# Patient Record
Sex: Male | Born: 1975 | Race: White | Hispanic: No | Marital: Married | Smoking: Current every day smoker
Health system: Southern US, Community
[De-identification: ages and names within clinical notes are randomized; demographics above are authoritative.]

## PROBLEM LIST (undated history)

## (undated) DIAGNOSIS — M1990 Unspecified osteoarthritis, unspecified site: Secondary | ICD-10-CM

## (undated) DIAGNOSIS — I10 Essential (primary) hypertension: Secondary | ICD-10-CM

## (undated) HISTORY — DX: Unspecified osteoarthritis, unspecified site: M19.90

---

## 2009-07-21 ENCOUNTER — Emergency Department: Payer: Self-pay | Admitting: Emergency Medicine

## 2009-08-25 ENCOUNTER — Ambulatory Visit: Payer: Self-pay | Admitting: Unknown Physician Specialty

## 2012-05-02 ENCOUNTER — Ambulatory Visit: Payer: Self-pay | Admitting: Family

## 2015-04-08 DIAGNOSIS — G5701 Lesion of sciatic nerve, right lower limb: Secondary | ICD-10-CM | POA: Diagnosis not present

## 2015-04-08 DIAGNOSIS — R5381 Other malaise: Secondary | ICD-10-CM | POA: Diagnosis not present

## 2015-06-22 DIAGNOSIS — E784 Other hyperlipidemia: Secondary | ICD-10-CM | POA: Diagnosis not present

## 2015-06-22 DIAGNOSIS — G5701 Lesion of sciatic nerve, right lower limb: Secondary | ICD-10-CM | POA: Diagnosis not present

## 2015-06-22 DIAGNOSIS — I10 Essential (primary) hypertension: Secondary | ICD-10-CM | POA: Diagnosis not present

## 2015-06-22 DIAGNOSIS — R5381 Other malaise: Secondary | ICD-10-CM | POA: Diagnosis not present

## 2015-06-22 DIAGNOSIS — G894 Chronic pain syndrome: Secondary | ICD-10-CM | POA: Diagnosis not present

## 2015-06-29 DIAGNOSIS — G5701 Lesion of sciatic nerve, right lower limb: Secondary | ICD-10-CM | POA: Diagnosis not present

## 2015-06-29 DIAGNOSIS — M5186 Other intervertebral disc disorders, lumbar region: Secondary | ICD-10-CM | POA: Diagnosis not present

## 2015-06-29 DIAGNOSIS — R748 Abnormal levels of other serum enzymes: Secondary | ICD-10-CM | POA: Diagnosis not present

## 2015-08-07 DIAGNOSIS — G5701 Lesion of sciatic nerve, right lower limb: Secondary | ICD-10-CM | POA: Diagnosis not present

## 2015-08-07 DIAGNOSIS — R748 Abnormal levels of other serum enzymes: Secondary | ICD-10-CM | POA: Diagnosis not present

## 2015-10-13 DIAGNOSIS — H722X1 Other marginal perforations of tympanic membrane, right ear: Secondary | ICD-10-CM | POA: Diagnosis not present

## 2015-10-13 DIAGNOSIS — H9011 Conductive hearing loss, unilateral, right ear, with unrestricted hearing on the contralateral side: Secondary | ICD-10-CM | POA: Diagnosis not present

## 2015-10-13 DIAGNOSIS — Z8661 Personal history of infections of the central nervous system: Secondary | ICD-10-CM | POA: Diagnosis not present

## 2015-10-13 DIAGNOSIS — H7191 Unspecified cholesteatoma, right ear: Secondary | ICD-10-CM | POA: Diagnosis not present

## 2015-11-06 DIAGNOSIS — R748 Abnormal levels of other serum enzymes: Secondary | ICD-10-CM | POA: Diagnosis not present

## 2015-11-06 DIAGNOSIS — R5381 Other malaise: Secondary | ICD-10-CM | POA: Diagnosis not present

## 2015-11-06 DIAGNOSIS — G5701 Lesion of sciatic nerve, right lower limb: Secondary | ICD-10-CM | POA: Diagnosis not present

## 2015-12-09 DIAGNOSIS — K589 Irritable bowel syndrome without diarrhea: Secondary | ICD-10-CM | POA: Diagnosis not present

## 2015-12-09 DIAGNOSIS — M5186 Other intervertebral disc disorders, lumbar region: Secondary | ICD-10-CM | POA: Diagnosis not present

## 2015-12-09 DIAGNOSIS — R748 Abnormal levels of other serum enzymes: Secondary | ICD-10-CM | POA: Diagnosis not present

## 2015-12-09 DIAGNOSIS — G5701 Lesion of sciatic nerve, right lower limb: Secondary | ICD-10-CM | POA: Diagnosis not present

## 2016-04-06 DIAGNOSIS — G5701 Lesion of sciatic nerve, right lower limb: Secondary | ICD-10-CM | POA: Diagnosis not present

## 2016-04-06 DIAGNOSIS — J309 Allergic rhinitis, unspecified: Secondary | ICD-10-CM | POA: Diagnosis not present

## 2016-04-06 DIAGNOSIS — M549 Dorsalgia, unspecified: Secondary | ICD-10-CM | POA: Diagnosis not present

## 2016-04-11 ENCOUNTER — Other Ambulatory Visit: Payer: Self-pay | Admitting: Cardiology

## 2016-04-11 ENCOUNTER — Ambulatory Visit
Admission: RE | Admit: 2016-04-11 | Discharge: 2016-04-11 | Disposition: A | Discharge status: Home / Self Care | Payer: BLUE CROSS/BLUE SHIELD | Source: Ambulatory Visit | Attending: Cardiology | Admitting: Cardiology

## 2016-04-11 ENCOUNTER — Ambulatory Visit
Admission: RE | Admit: 2016-04-11 | Discharge: 2016-04-11 | Disposition: A | Discharge status: Home / Self Care | Payer: BLUE CROSS/BLUE SHIELD | Source: Ambulatory Visit | Attending: Internal Medicine | Admitting: Internal Medicine

## 2016-04-11 DIAGNOSIS — G5701 Lesion of sciatic nerve, right lower limb: Secondary | ICD-10-CM | POA: Diagnosis not present

## 2016-04-11 DIAGNOSIS — M545 Low back pain: Secondary | ICD-10-CM | POA: Insufficient documentation

## 2016-04-11 DIAGNOSIS — M519 Unspecified thoracic, thoracolumbar and lumbosacral intervertebral disc disorder: Secondary | ICD-10-CM

## 2016-04-11 DIAGNOSIS — M5186 Other intervertebral disc disorders, lumbar region: Secondary | ICD-10-CM | POA: Diagnosis not present

## 2016-04-11 DIAGNOSIS — R5381 Other malaise: Secondary | ICD-10-CM | POA: Diagnosis not present

## 2016-04-11 DIAGNOSIS — J309 Allergic rhinitis, unspecified: Secondary | ICD-10-CM | POA: Diagnosis not present

## 2016-06-24 DIAGNOSIS — R748 Abnormal levels of other serum enzymes: Secondary | ICD-10-CM | POA: Diagnosis not present

## 2016-06-24 DIAGNOSIS — G5701 Lesion of sciatic nerve, right lower limb: Secondary | ICD-10-CM | POA: Diagnosis not present

## 2016-06-24 DIAGNOSIS — J019 Acute sinusitis, unspecified: Secondary | ICD-10-CM | POA: Diagnosis not present

## 2016-06-24 DIAGNOSIS — M549 Dorsalgia, unspecified: Secondary | ICD-10-CM | POA: Diagnosis not present

## 2016-09-26 DIAGNOSIS — M549 Dorsalgia, unspecified: Secondary | ICD-10-CM | POA: Diagnosis not present

## 2016-09-26 DIAGNOSIS — G5701 Lesion of sciatic nerve, right lower limb: Secondary | ICD-10-CM | POA: Diagnosis not present

## 2016-09-26 DIAGNOSIS — M5186 Other intervertebral disc disorders, lumbar region: Secondary | ICD-10-CM | POA: Diagnosis not present

## 2016-09-26 DIAGNOSIS — R748 Abnormal levels of other serum enzymes: Secondary | ICD-10-CM | POA: Diagnosis not present

## 2016-12-05 DIAGNOSIS — G5701 Lesion of sciatic nerve, right lower limb: Secondary | ICD-10-CM | POA: Diagnosis not present

## 2016-12-05 DIAGNOSIS — M549 Dorsalgia, unspecified: Secondary | ICD-10-CM | POA: Diagnosis not present

## 2016-12-05 DIAGNOSIS — M5186 Other intervertebral disc disorders, lumbar region: Secondary | ICD-10-CM | POA: Diagnosis not present

## 2016-12-09 ENCOUNTER — Other Ambulatory Visit: Payer: Self-pay | Admitting: Cardiology

## 2016-12-09 ENCOUNTER — Ambulatory Visit
Admission: RE | Admit: 2016-12-09 | Discharge: 2016-12-09 | Disposition: A | Discharge status: Home / Self Care | Payer: BLUE CROSS/BLUE SHIELD | Source: Ambulatory Visit | Attending: Internal Medicine | Admitting: Internal Medicine

## 2016-12-09 ENCOUNTER — Ambulatory Visit
Admission: RE | Admit: 2016-12-09 | Discharge: 2016-12-09 | Disposition: A | Discharge status: Home / Self Care | Payer: BLUE CROSS/BLUE SHIELD | Source: Ambulatory Visit | Attending: Cardiology | Admitting: Cardiology

## 2016-12-09 DIAGNOSIS — M549 Dorsalgia, unspecified: Secondary | ICD-10-CM | POA: Diagnosis not present

## 2016-12-09 DIAGNOSIS — R5381 Other malaise: Secondary | ICD-10-CM | POA: Diagnosis not present

## 2016-12-09 DIAGNOSIS — M545 Low back pain: Secondary | ICD-10-CM | POA: Insufficient documentation

## 2016-12-09 DIAGNOSIS — G5701 Lesion of sciatic nerve, right lower limb: Secondary | ICD-10-CM | POA: Diagnosis not present

## 2016-12-09 DIAGNOSIS — R748 Abnormal levels of other serum enzymes: Secondary | ICD-10-CM | POA: Diagnosis not present

## 2016-12-13 DIAGNOSIS — M549 Dorsalgia, unspecified: Secondary | ICD-10-CM | POA: Diagnosis not present

## 2016-12-13 DIAGNOSIS — J019 Acute sinusitis, unspecified: Secondary | ICD-10-CM | POA: Diagnosis not present

## 2016-12-13 DIAGNOSIS — R748 Abnormal levels of other serum enzymes: Secondary | ICD-10-CM | POA: Diagnosis not present

## 2016-12-13 DIAGNOSIS — G5701 Lesion of sciatic nerve, right lower limb: Secondary | ICD-10-CM | POA: Diagnosis not present

## 2016-12-19 DIAGNOSIS — M549 Dorsalgia, unspecified: Secondary | ICD-10-CM | POA: Diagnosis not present

## 2016-12-19 DIAGNOSIS — R5381 Other malaise: Secondary | ICD-10-CM | POA: Diagnosis not present

## 2016-12-19 DIAGNOSIS — G5701 Lesion of sciatic nerve, right lower limb: Secondary | ICD-10-CM | POA: Diagnosis not present

## 2016-12-19 DIAGNOSIS — R748 Abnormal levels of other serum enzymes: Secondary | ICD-10-CM | POA: Diagnosis not present

## 2016-12-22 DIAGNOSIS — R2689 Other abnormalities of gait and mobility: Secondary | ICD-10-CM | POA: Diagnosis not present

## 2016-12-22 DIAGNOSIS — M25551 Pain in right hip: Secondary | ICD-10-CM | POA: Diagnosis not present

## 2016-12-22 DIAGNOSIS — M545 Low back pain: Secondary | ICD-10-CM | POA: Diagnosis not present

## 2016-12-22 DIAGNOSIS — M6281 Muscle weakness (generalized): Secondary | ICD-10-CM | POA: Diagnosis not present

## 2016-12-29 DIAGNOSIS — M25551 Pain in right hip: Secondary | ICD-10-CM | POA: Diagnosis not present

## 2016-12-29 DIAGNOSIS — M545 Low back pain: Secondary | ICD-10-CM | POA: Diagnosis not present

## 2016-12-29 DIAGNOSIS — M6281 Muscle weakness (generalized): Secondary | ICD-10-CM | POA: Diagnosis not present

## 2017-01-09 DIAGNOSIS — J019 Acute sinusitis, unspecified: Secondary | ICD-10-CM | POA: Diagnosis not present

## 2017-01-09 DIAGNOSIS — R2689 Other abnormalities of gait and mobility: Secondary | ICD-10-CM | POA: Diagnosis not present

## 2017-01-09 DIAGNOSIS — M545 Low back pain: Secondary | ICD-10-CM | POA: Diagnosis not present

## 2017-01-09 DIAGNOSIS — G5701 Lesion of sciatic nerve, right lower limb: Secondary | ICD-10-CM | POA: Diagnosis not present

## 2017-01-09 DIAGNOSIS — M6281 Muscle weakness (generalized): Secondary | ICD-10-CM | POA: Diagnosis not present

## 2017-01-09 DIAGNOSIS — M549 Dorsalgia, unspecified: Secondary | ICD-10-CM | POA: Diagnosis not present

## 2017-01-09 DIAGNOSIS — M5186 Other intervertebral disc disorders, lumbar region: Secondary | ICD-10-CM | POA: Diagnosis not present

## 2017-01-09 DIAGNOSIS — M25551 Pain in right hip: Secondary | ICD-10-CM | POA: Diagnosis not present

## 2017-01-11 DIAGNOSIS — M545 Low back pain: Secondary | ICD-10-CM | POA: Diagnosis not present

## 2017-01-11 DIAGNOSIS — R2689 Other abnormalities of gait and mobility: Secondary | ICD-10-CM | POA: Diagnosis not present

## 2017-01-11 DIAGNOSIS — M6281 Muscle weakness (generalized): Secondary | ICD-10-CM | POA: Diagnosis not present

## 2017-01-11 DIAGNOSIS — M25551 Pain in right hip: Secondary | ICD-10-CM | POA: Diagnosis not present

## 2017-01-16 DIAGNOSIS — M6281 Muscle weakness (generalized): Secondary | ICD-10-CM | POA: Diagnosis not present

## 2017-01-16 DIAGNOSIS — M25551 Pain in right hip: Secondary | ICD-10-CM | POA: Diagnosis not present

## 2017-01-16 DIAGNOSIS — M545 Low back pain: Secondary | ICD-10-CM | POA: Diagnosis not present

## 2017-01-16 DIAGNOSIS — R2689 Other abnormalities of gait and mobility: Secondary | ICD-10-CM | POA: Diagnosis not present

## 2017-01-20 DIAGNOSIS — M6281 Muscle weakness (generalized): Secondary | ICD-10-CM | POA: Diagnosis not present

## 2017-01-20 DIAGNOSIS — M545 Low back pain: Secondary | ICD-10-CM | POA: Diagnosis not present

## 2017-01-20 DIAGNOSIS — M25551 Pain in right hip: Secondary | ICD-10-CM | POA: Diagnosis not present

## 2017-01-20 DIAGNOSIS — R2689 Other abnormalities of gait and mobility: Secondary | ICD-10-CM | POA: Diagnosis not present

## 2017-01-23 DIAGNOSIS — R2689 Other abnormalities of gait and mobility: Secondary | ICD-10-CM | POA: Diagnosis not present

## 2017-01-23 DIAGNOSIS — M545 Low back pain: Secondary | ICD-10-CM | POA: Diagnosis not present

## 2017-01-23 DIAGNOSIS — M6281 Muscle weakness (generalized): Secondary | ICD-10-CM | POA: Diagnosis not present

## 2017-01-23 DIAGNOSIS — M25551 Pain in right hip: Secondary | ICD-10-CM | POA: Diagnosis not present

## 2017-01-25 DIAGNOSIS — M25551 Pain in right hip: Secondary | ICD-10-CM | POA: Diagnosis not present

## 2017-01-25 DIAGNOSIS — M545 Low back pain: Secondary | ICD-10-CM | POA: Diagnosis not present

## 2017-01-25 DIAGNOSIS — M6281 Muscle weakness (generalized): Secondary | ICD-10-CM | POA: Diagnosis not present

## 2017-01-25 DIAGNOSIS — R2689 Other abnormalities of gait and mobility: Secondary | ICD-10-CM | POA: Diagnosis not present

## 2017-01-30 DIAGNOSIS — R2689 Other abnormalities of gait and mobility: Secondary | ICD-10-CM | POA: Diagnosis not present

## 2017-01-30 DIAGNOSIS — M25551 Pain in right hip: Secondary | ICD-10-CM | POA: Diagnosis not present

## 2017-01-30 DIAGNOSIS — M6281 Muscle weakness (generalized): Secondary | ICD-10-CM | POA: Diagnosis not present

## 2017-01-30 DIAGNOSIS — M545 Low back pain: Secondary | ICD-10-CM | POA: Diagnosis not present

## 2017-01-31 DIAGNOSIS — M5186 Other intervertebral disc disorders, lumbar region: Secondary | ICD-10-CM | POA: Diagnosis not present

## 2017-01-31 DIAGNOSIS — G5701 Lesion of sciatic nerve, right lower limb: Secondary | ICD-10-CM | POA: Diagnosis not present

## 2017-01-31 DIAGNOSIS — R748 Abnormal levels of other serum enzymes: Secondary | ICD-10-CM | POA: Diagnosis not present

## 2017-01-31 DIAGNOSIS — R5381 Other malaise: Secondary | ICD-10-CM | POA: Diagnosis not present

## 2017-02-01 DIAGNOSIS — M25551 Pain in right hip: Secondary | ICD-10-CM | POA: Diagnosis not present

## 2017-02-01 DIAGNOSIS — M545 Low back pain: Secondary | ICD-10-CM | POA: Diagnosis not present

## 2017-02-01 DIAGNOSIS — R2689 Other abnormalities of gait and mobility: Secondary | ICD-10-CM | POA: Diagnosis not present

## 2017-02-01 DIAGNOSIS — M6281 Muscle weakness (generalized): Secondary | ICD-10-CM | POA: Diagnosis not present

## 2017-02-08 DIAGNOSIS — M545 Low back pain: Secondary | ICD-10-CM | POA: Diagnosis not present

## 2017-02-08 DIAGNOSIS — R2689 Other abnormalities of gait and mobility: Secondary | ICD-10-CM | POA: Diagnosis not present

## 2017-02-08 DIAGNOSIS — M6281 Muscle weakness (generalized): Secondary | ICD-10-CM | POA: Diagnosis not present

## 2017-02-08 DIAGNOSIS — M25551 Pain in right hip: Secondary | ICD-10-CM | POA: Diagnosis not present

## 2017-02-10 DIAGNOSIS — R2689 Other abnormalities of gait and mobility: Secondary | ICD-10-CM | POA: Diagnosis not present

## 2017-02-10 DIAGNOSIS — M545 Low back pain: Secondary | ICD-10-CM | POA: Diagnosis not present

## 2017-02-10 DIAGNOSIS — M6281 Muscle weakness (generalized): Secondary | ICD-10-CM | POA: Diagnosis not present

## 2017-02-10 DIAGNOSIS — F4322 Adjustment disorder with anxiety: Secondary | ICD-10-CM | POA: Diagnosis not present

## 2017-02-10 DIAGNOSIS — M25551 Pain in right hip: Secondary | ICD-10-CM | POA: Diagnosis not present

## 2017-02-15 DIAGNOSIS — M25551 Pain in right hip: Secondary | ICD-10-CM | POA: Diagnosis not present

## 2017-02-15 DIAGNOSIS — M6281 Muscle weakness (generalized): Secondary | ICD-10-CM | POA: Diagnosis not present

## 2017-02-15 DIAGNOSIS — M545 Low back pain: Secondary | ICD-10-CM | POA: Diagnosis not present

## 2017-02-15 DIAGNOSIS — R2689 Other abnormalities of gait and mobility: Secondary | ICD-10-CM | POA: Diagnosis not present

## 2017-02-17 DIAGNOSIS — G5701 Lesion of sciatic nerve, right lower limb: Secondary | ICD-10-CM | POA: Diagnosis not present

## 2017-02-17 DIAGNOSIS — R748 Abnormal levels of other serum enzymes: Secondary | ICD-10-CM | POA: Diagnosis not present

## 2017-02-17 DIAGNOSIS — J019 Acute sinusitis, unspecified: Secondary | ICD-10-CM | POA: Diagnosis not present

## 2017-02-17 DIAGNOSIS — M549 Dorsalgia, unspecified: Secondary | ICD-10-CM | POA: Diagnosis not present

## 2017-02-27 DIAGNOSIS — F4322 Adjustment disorder with anxiety: Secondary | ICD-10-CM | POA: Diagnosis not present

## 2017-03-30 DIAGNOSIS — R748 Abnormal levels of other serum enzymes: Secondary | ICD-10-CM | POA: Diagnosis not present

## 2017-03-30 DIAGNOSIS — M549 Dorsalgia, unspecified: Secondary | ICD-10-CM | POA: Diagnosis not present

## 2017-03-30 DIAGNOSIS — G5701 Lesion of sciatic nerve, right lower limb: Secondary | ICD-10-CM | POA: Diagnosis not present

## 2017-03-30 DIAGNOSIS — J019 Acute sinusitis, unspecified: Secondary | ICD-10-CM | POA: Diagnosis not present

## 2017-06-15 DIAGNOSIS — M5441 Lumbago with sciatica, right side: Secondary | ICD-10-CM | POA: Diagnosis not present

## 2017-06-15 DIAGNOSIS — M9903 Segmental and somatic dysfunction of lumbar region: Secondary | ICD-10-CM | POA: Diagnosis not present

## 2017-06-15 DIAGNOSIS — M9904 Segmental and somatic dysfunction of sacral region: Secondary | ICD-10-CM | POA: Diagnosis not present

## 2017-06-15 DIAGNOSIS — M461 Sacroiliitis, not elsewhere classified: Secondary | ICD-10-CM | POA: Diagnosis not present

## 2017-06-19 DIAGNOSIS — M5441 Lumbago with sciatica, right side: Secondary | ICD-10-CM | POA: Diagnosis not present

## 2017-06-19 DIAGNOSIS — M9904 Segmental and somatic dysfunction of sacral region: Secondary | ICD-10-CM | POA: Diagnosis not present

## 2017-06-19 DIAGNOSIS — M9903 Segmental and somatic dysfunction of lumbar region: Secondary | ICD-10-CM | POA: Diagnosis not present

## 2017-06-19 DIAGNOSIS — M461 Sacroiliitis, not elsewhere classified: Secondary | ICD-10-CM | POA: Diagnosis not present

## 2017-06-22 DIAGNOSIS — M9904 Segmental and somatic dysfunction of sacral region: Secondary | ICD-10-CM | POA: Diagnosis not present

## 2017-06-22 DIAGNOSIS — M9903 Segmental and somatic dysfunction of lumbar region: Secondary | ICD-10-CM | POA: Diagnosis not present

## 2017-06-22 DIAGNOSIS — M5441 Lumbago with sciatica, right side: Secondary | ICD-10-CM | POA: Diagnosis not present

## 2017-06-22 DIAGNOSIS — M461 Sacroiliitis, not elsewhere classified: Secondary | ICD-10-CM | POA: Diagnosis not present

## 2017-06-29 DIAGNOSIS — M9904 Segmental and somatic dysfunction of sacral region: Secondary | ICD-10-CM | POA: Diagnosis not present

## 2017-06-29 DIAGNOSIS — M9903 Segmental and somatic dysfunction of lumbar region: Secondary | ICD-10-CM | POA: Diagnosis not present

## 2017-06-29 DIAGNOSIS — M5441 Lumbago with sciatica, right side: Secondary | ICD-10-CM | POA: Diagnosis not present

## 2017-06-29 DIAGNOSIS — M461 Sacroiliitis, not elsewhere classified: Secondary | ICD-10-CM | POA: Diagnosis not present

## 2017-07-03 DIAGNOSIS — M9904 Segmental and somatic dysfunction of sacral region: Secondary | ICD-10-CM | POA: Diagnosis not present

## 2017-07-03 DIAGNOSIS — M5441 Lumbago with sciatica, right side: Secondary | ICD-10-CM | POA: Diagnosis not present

## 2017-07-03 DIAGNOSIS — M9903 Segmental and somatic dysfunction of lumbar region: Secondary | ICD-10-CM | POA: Diagnosis not present

## 2017-07-03 DIAGNOSIS — M461 Sacroiliitis, not elsewhere classified: Secondary | ICD-10-CM | POA: Diagnosis not present

## 2017-08-04 DIAGNOSIS — R748 Abnormal levels of other serum enzymes: Secondary | ICD-10-CM | POA: Diagnosis not present

## 2017-08-04 DIAGNOSIS — M5186 Other intervertebral disc disorders, lumbar region: Secondary | ICD-10-CM | POA: Diagnosis not present

## 2017-08-04 DIAGNOSIS — G5701 Lesion of sciatic nerve, right lower limb: Secondary | ICD-10-CM | POA: Diagnosis not present

## 2017-08-04 DIAGNOSIS — R5381 Other malaise: Secondary | ICD-10-CM | POA: Diagnosis not present

## 2017-10-02 DIAGNOSIS — G5701 Lesion of sciatic nerve, right lower limb: Secondary | ICD-10-CM | POA: Diagnosis not present

## 2017-10-02 DIAGNOSIS — M549 Dorsalgia, unspecified: Secondary | ICD-10-CM | POA: Diagnosis not present

## 2017-10-02 DIAGNOSIS — R748 Abnormal levels of other serum enzymes: Secondary | ICD-10-CM | POA: Diagnosis not present

## 2017-10-02 DIAGNOSIS — R5381 Other malaise: Secondary | ICD-10-CM | POA: Diagnosis not present

## 2017-11-26 ENCOUNTER — Emergency Department (HOSPITAL_COMMUNITY)
Admission: EM | Admit: 2017-11-26 | Discharge: 2017-11-26 | Disposition: A | Discharge status: Home / Self Care | Payer: Managed Care, Other (non HMO) | Attending: Emergency Medicine | Admitting: Emergency Medicine

## 2017-11-26 ENCOUNTER — Emergency Department (HOSPITAL_COMMUNITY): Payer: Managed Care, Other (non HMO)

## 2017-11-26 ENCOUNTER — Other Ambulatory Visit: Payer: Self-pay

## 2017-11-26 ENCOUNTER — Encounter (HOSPITAL_COMMUNITY): Payer: Self-pay | Admitting: Emergency Medicine

## 2017-11-26 DIAGNOSIS — M5432 Sciatica, left side: Secondary | ICD-10-CM | POA: Insufficient documentation

## 2017-11-26 DIAGNOSIS — M545 Low back pain: Secondary | ICD-10-CM | POA: Diagnosis present

## 2017-11-26 DIAGNOSIS — M62830 Muscle spasm of back: Secondary | ICD-10-CM | POA: Diagnosis not present

## 2017-11-26 DIAGNOSIS — R0789 Other chest pain: Secondary | ICD-10-CM

## 2017-11-26 HISTORY — DX: Essential (primary) hypertension: I10

## 2017-11-26 LAB — HEPATIC FUNCTION PANEL
ALT: 17 U/L (ref 0–44)
AST: 20 U/L (ref 15–41)
Albumin: 3.9 g/dL (ref 3.5–5.0)
Alkaline Phosphatase: 94 U/L (ref 38–126)
Bilirubin, Direct: <0.1 mg/dL (ref 0.0–0.2)
Total Bilirubin: 0.4 mg/dL (ref 0.3–1.2)
Total Protein: 6.8 g/dL (ref 6.5–8.1)

## 2017-11-26 LAB — I-STAT TROPONIN, ED
Troponin i, poc: 0.01 ng/mL (ref 0.00–0.08)
Troponin i, poc: 0 ng/mL (ref 0.00–0.08)

## 2017-11-26 LAB — CBC
HCT: 43.8 % (ref 39.0–52.0)
Hemoglobin: 14.1 g/dL (ref 13.0–17.0)
MCH: 28.9 pg (ref 26.0–34.0)
MCHC: 32.2 g/dL (ref 30.0–36.0)
MCV: 89.8 fL (ref 80.0–100.0)
Platelets: 276 10*3/uL (ref 150–400)
RBC: 4.88 MIL/uL (ref 4.22–5.81)
RDW: 13.8 % (ref 11.5–15.5)
WBC: 9.3 10*3/uL (ref 4.0–10.5)
nRBC: 0 % (ref 0.0–0.2)

## 2017-11-26 LAB — BASIC METABOLIC PANEL
Anion gap: 7 (ref 5–15)
BUN: 10 mg/dL (ref 6–20)
CO2: 29 mmol/L (ref 22–32)
Calcium: 9.2 mg/dL (ref 8.9–10.3)
Chloride: 105 mmol/L (ref 98–111)
Creatinine, Ser: 0.92 mg/dL (ref 0.61–1.24)
GFR calc Af Amer: >60 mL/min (ref >60)
GFR calc non Af Amer: >60 mL/min (ref >60)
Glucose, Bld: 77 mg/dL (ref 70–99)
Potassium: 3.5 mmol/L (ref 3.5–5.1)
Sodium: 141 mmol/L (ref 135–145)

## 2017-11-26 LAB — LIPASE, BLOOD: Lipase: 23 U/L (ref 11–51)

## 2017-11-26 NOTE — Discharge Instructions (Signed)
Please follow-up with your primary care doctor.  If you develop shortness of breath, feel like you are going to pass out, or have additional concerns please seek additional medical care and evaluation.  Today your blood work for your heart was normal.

## 2017-11-26 NOTE — ED Triage Notes (Signed)
Pt c/o chest pain after opening a door last night. Pt also reports felling lightheaded and dizzy when the pain started. Denies shortness of breath, nausea/vomiting.

## 2017-11-26 NOTE — ED Provider Notes (Signed)
[facility] EM[facility]ENCY [facility] Provider Note   CSN: [id number] Arrival date & time: 11/26/17  1536     History   Chief Complaint Chief Complaint  Patient presents with  . Chest Pain    HPI Kandace ParkinsDanny C Reier is a 42 y.o. male with a past medical history of hypertension who presents today for evaluation of chest pain.  He reports that last night he was at home opening a sliding door when he felt a pop in the right side of his chest.  He reports sudden onset of pain, and shortness of breath.  He does report that in retrospect he was very anxious over this.  He felt nauseous and felt like the backs of his hands and tops of his feet were wet.  He was able to go lay in bed when he felt chilled and so he laid in the hot bath and started feeling better.  He reports that he is having continued generalized chest pain.  He does report occasional nausea however no vomiting.  His pain has not radiated or moved.  He does not have any personal history of blood clots.  No recent surgeries or immobilizations.  Risk factors include hypertension, over 15 years of smoking cigarettes.  HPI  Past Medical History:  Diagnosis Date  . Hypertension     There are no active problems to display for this patient.   History reviewed. No pertinent surgical history.      Home Medications    Prior to Admission medications   Not on File    Family History No family history on file.  Social History Social History   Tobacco Use  . Smoking status: Current Every Day Smoker  . Smokeless tobacco: Never Used  Substance Use Topics  . Alcohol use: Not Currently  . Drug use: Never     Allergies   Penicillins   Review of Systems Review of Systems  Constitutional: Negative for chills and fever.  HENT: Negative for ear pain and sore throat.   Eyes: Negative for pain and visual disturbance.  Respiratory: Negative for cough and shortness of breath.   Cardiovascular: Positive for  chest pain. Negative for palpitations.  Gastrointestinal: Positive for nausea. Negative for abdominal pain and vomiting.  Genitourinary: Negative for dysuria and hematuria.  Musculoskeletal: Negative for arthralgias and back pain.  Skin: Negative for color change and rash.  Neurological: Negative for seizures and syncope.  All other systems reviewed and are negative.    Physical Exam Updated Vital Signs BP 125/90 (BP Location: Right Arm)   Pulse 74   Temp 97.9 F (36.6 C) (Oral)   Resp 16   SpO2 99%   Physical Exam  Constitutional: He appears well-developed and well-nourished.  Non-toxic appearance. No distress.  HENT:  Head: Normocephalic and atraumatic.  Eyes: Conjunctivae are normal.  Neck: Normal range of motion. Neck supple. No JVD present. No tracheal deviation present.  Cardiovascular: Normal rate, regular rhythm, intact distal pulses and normal pulses.  No murmur heard. Pulmonary/Chest: Effort normal and breath sounds normal. No accessory muscle usage. No respiratory distress. He has no decreased breath sounds.  No TTP over bilateral chest.   Abdominal: Soft. Bowel sounds are normal. He exhibits no distension. There is no tenderness. There is no guarding.  Musculoskeletal: He exhibits no edema.       Right lower leg: Normal. He exhibits no tenderness and no edema.       Left lower leg: Normal. He exhibits  no tenderness and no edema.  Neurological: He is alert.  Skin: Skin is warm and dry.  Psychiatric: He has a normal mood and affect.  Nursing note and vitals reviewed.    ED Treatments / Results  Labs (all labs ordered are listed, but only abnormal results are displayed) Labs Reviewed  BASIC METABOLIC PANEL  CBC  HEPATIC FUNCTION PANEL  LIPASE, BLOOD  I-STAT TROPONIN, ED  I-STAT TROPONIN, ED    EKG None  Radiology Dg Chest 2 View  Result Date: 11/26/2017 CLINICAL DATA:  42 year old male with history of chest pain. Dizziness. EXAM: CHEST - 2 VIEW  COMPARISON:  None. FINDINGS: Lung volumes are normal. No consolidative airspace disease. No pleural effusions. No pneumothorax. No pulmonary nodule or mass noted. Pulmonary vasculature and the cardiomediastinal silhouette are within normal limits. IMPRESSION: No radiographic evidence of acute cardiopulmonary disease. Electronically Signed   By: Trudie Reed M.D.   On: 11/26/2017 17:31    Procedures Procedures (including critical care time)  Medications Ordered in ED Medications - No data to display   Initial Impression / Assessment and Plan / ED Course  I have reviewed the triage vital signs and the nursing notes.  Pertinent labs & imaging results that were available during my care of the patient were reviewed by me and considered in my medical decision making (see chart for details).  Clinical Course as of Nov 27 2135  Sun Nov 26, 2017  1556 Attempted to see patient, not in room.    [EH]    Clinical Course User Index [EH] Cristina Gong, PA-C   Patient is to be discharged with recommendation to follow up with PCP in regards to today's hospital visit. Chest pain is not likely of cardiac or pulmonary etiology d/t presentation, PERC negative, VSS, no tracheal deviation, no JVD or new murmur, RRR, breath sounds equal bilaterally, EKG without acute abnormalities, negative troponin, and negative CXR. Pt has been advised to return to the ED if CP becomes exertional, associated with diaphoresis or nausea, radiates to left jaw/arm, worsens or becomes concerning in any way. Pt appears reliable for follow up and is agreeable to discharge.   Suspect MSK cause for pain, given onset while opening a sliding door.     Final Clinical Impressions(s) / ED Diagnoses   Final diagnoses:  Atypical chest pain    ED Discharge Orders    None       Norman Clay 11/26/17 2137    Mancel Bale, MD 11/27/17 [phone]

## 2018-01-01 ENCOUNTER — Ambulatory Visit (INDEPENDENT_AMBULATORY_CARE_PROVIDER_SITE_OTHER): Payer: Managed Care, Other (non HMO) | Admitting: Family

## 2018-01-01 ENCOUNTER — Encounter: Payer: Self-pay | Admitting: Family

## 2018-01-01 VITALS — BP 118/80 | HR 97 | Temp 98.2°F | Ht 74.0 in | Wt 162.2 lb

## 2018-01-01 DIAGNOSIS — G8929 Other chronic pain: Secondary | ICD-10-CM | POA: Diagnosis not present

## 2018-01-01 DIAGNOSIS — M545 Low back pain, unspecified: Secondary | ICD-10-CM | POA: Insufficient documentation

## 2018-01-01 MED ORDER — MELOXICAM 7.5 MG PO TABS: 7.5000 mg | ORAL_TABLET | Freq: Every day | ORAL | 0 refills | Status: DC | PRN

## 2018-01-01 NOTE — Assessment & Plan Note (Addendum)
Agreed that we will discontinue Lyrica.  Patient will wean off this medication.  Patient does not appear to have a neuropathic component to pain.  Discussed his prior history of alcohol abuse, and my reservation with him being on controlled substances.  Plan to change tramadol to 50 mg twice a day as needed (this is a decrease from 100 mg 2-3 times per day from prior PCP.)   Long discussion in regards to this medication being more of a as needed versus scheduled medication.  Patient verbalized understanding of it was very much agreement of this.  Education advised on proper, safe storage, abstaining from alcohol use.  We will work on weaning off Lyrica, patient will also start meloxicam.  In the hopes of finding a safer regimen which may include tramadol as needed, meloxicam on most days.  Refer to pain management for possible discussion of epidural injections if appropriate.  Controlled substance contract in place.  Close follow-up. I looked up patient on [place] Controlled Substances Reporting System and saw no activity that raised concern of inappropriate use.

## 2018-01-01 NOTE — Patient Instructions (Addendum)
Discontinue lyrica  One 150mg  tablet per day lyrica for one week.  Then one tablet every other day the following week.  Then 3rd week, every third day, take one 150mg  tablet, then you may stop alltogether  May use mobic daily- we will discuss this further at follow up.  May stop all other forms of NSAIDs including Aleve, Advil etc. as these are the same thing as Mobic  Nice to meet you!
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
## 2018-01-03 ENCOUNTER — Encounter: Payer: Self-pay | Admitting: Family

## 2018-01-05 ENCOUNTER — Telehealth: Payer: Self-pay | Admitting: Family

## 2018-01-05 ENCOUNTER — Other Ambulatory Visit: Payer: Self-pay | Admitting: Family

## 2018-01-05 DIAGNOSIS — M545 Low back pain, unspecified: Secondary | ICD-10-CM

## 2018-01-05 DIAGNOSIS — G8929 Other chronic pain: Secondary | ICD-10-CM

## 2018-01-05 MED ORDER — PREGABALIN 150 MG PO CAPS: 150.0000 mg | ORAL_CAPSULE | Freq: Two times a day (BID) | ORAL | 0 refills | Status: DC

## 2018-01-05 NOTE — Telephone Encounter (Signed)
Patient notified m& verbalized understanding.

## 2018-01-05 NOTE — Telephone Encounter (Signed)
Patient stated he was taking lyrica three times a day initially with Dr. Juel Burrow. When he saw you that's when he was told to taper down dosing to twice a day & start meloxicam. Patient stated that we can call Dr. Fredna Dow office & pharmacy to get clarification on how many times his mediation has ben changed.

## 2018-01-05 NOTE — Telephone Encounter (Signed)
Call patient  I reviewed [facility] database for controlled substances, his Lyrica was prescribed on December 15 for 30-day supply.  I am not sure why he is running out.  He should be taking twice per day and we discussed that he should be not taking no more than that.    We discussed he will be weaning off this medication.  I do not feel comfortable refilling earlier than January 14 as that would be 30 days.  Please discuss with patient as to how he is been taking.

## 2018-01-05 NOTE — Telephone Encounter (Signed)
Call patient Philip Stephens, he may not take Lyrica anymore than twice a day as I have prescribed.  We are weaning down and/or off this medication   I will send in a new prescription for him however I would not prescribe any more of that medication beyond twice daily.   I typically do not prescribe Lyrica.  We discussed all of this in our office visit.   please also inform patient that he will need to fill out a medical request form at follow up on 01/26/17.  I do not think we did this.  We can request prior records from prior PCP

## 2018-01-15 ENCOUNTER — Ambulatory Visit (INDEPENDENT_AMBULATORY_CARE_PROVIDER_SITE_OTHER): Payer: Managed Care, Other (non HMO) | Admitting: Family

## 2018-01-15 ENCOUNTER — Encounter: Payer: Self-pay | Admitting: Family

## 2018-01-15 VITALS — BP 124/82 | HR 93 | Temp 98.0°F | Wt 160.8 lb

## 2018-01-15 DIAGNOSIS — K219 Gastro-esophageal reflux disease without esophagitis: Secondary | ICD-10-CM | POA: Diagnosis not present

## 2018-01-15 DIAGNOSIS — F419 Anxiety disorder, unspecified: Secondary | ICD-10-CM | POA: Insufficient documentation

## 2018-01-15 DIAGNOSIS — G8929 Other chronic pain: Secondary | ICD-10-CM | POA: Diagnosis not present

## 2018-01-15 DIAGNOSIS — M545 Low back pain, unspecified: Secondary | ICD-10-CM

## 2018-01-15 MED ORDER — TRAMADOL HCL 50 MG PO TABS: 100.0000 mg | ORAL_TABLET | Freq: Two times a day (BID) | ORAL | 0 refills | Status: DC | PRN

## 2018-01-15 MED ORDER — SERTRALINE HCL 50 MG PO TABS: 50.0000 mg | ORAL_TABLET | Freq: Every day | ORAL | 3 refills | Status: DC

## 2018-01-15 MED ORDER — GABAPENTIN 600 MG PO TABS: 600.0000 mg | ORAL_TABLET | Freq: Three times a day (TID) | ORAL | 1 refills | Status: DC

## 2018-01-15 NOTE — Progress Notes (Signed)
Subjective:    Patient ID: Philip Stephens, male    DOB: 06/22/1975, 43 y.o.   MRN: [id number]  CC: Philip Stephens is a 43 y.o. male who presents today for follow up.   HPI:  Low back pain- lowest level it has been 'in 10 years.'  Tramadol 100mg  BID. Had gabapentin from prior   then went to 600mg  qam , 600mg  midday and 600mg  qhs which helps with pain. Not sedating for him.  Taking 800mg  ibuprofen BID.  Received a call from pain clinic however declined.  Didn't like the mobic, felt irritated on this medication.  Some anxiety, trouble sleeping as 'mind is on'. Worry about a lot of things, family. No depression. No Si/hi. No h/o suicide attempt or hospitalizations.   Alcohol is rare, twice per year. No more than one drink in a sitting. No h/o seziure.   GERD- on aciphex once per day. Some breakthrough epigastric burning. No trouble swallowing. Smoker. Never had EGD  Requested records from prior pcp today since done prio        HISTORY:  Past Medical History:  Diagnosis Date  . Arthritis   . Hypertension    History reviewed. No pertinent surgical history. Family History  Problem Relation Age of Onset  . Hyperlipidemia Mother   . Hyperlipidemia Father   . Hearing loss Father   . Diabetes Father   . Arthritis Father     Allergies: Penicillins Current Outpatient Medications on File Prior to Visit  Medication Sig Dispense Refill  . RABEprazole (ACIPHEX) 20 MG tablet Take 20 mg by mouth daily.    . meloxicam (MOBIC) 7.5 MG tablet Take 1 tablet (7.5 mg total) by mouth daily as needed for pain. 90 tablet 0   No current facility-administered medications on file prior to visit.     Social History   Tobacco Use  . Smoking status: Current Every Day Smoker  . Smokeless tobacco: Former Engineer, water Use Topics  . Alcohol use: Not Currently  . Drug use: Never    Review of Systems  Constitutional: Negative for chills and fever.  Respiratory: Negative for cough.     Cardiovascular: Negative for chest pain and palpitations.  Gastrointestinal: Negative for nausea and vomiting.  Musculoskeletal: Positive for back pain.  Psychiatric/Behavioral: Positive for sleep disturbance. Negative for suicidal ideas. The patient is nervous/anxious.       Objective:    BP 124/82 (BP Location: Left Arm, Patient Position: Sitting, Cuff Size: Normal)   Pulse 93   Temp 98 F (36.7 C)   Wt 160 lb 12.8 oz (72.9 kg)   SpO2 97%   BMI 20.65 kg/m  BP Readings from Last 3 Encounters:  01/15/18 124/82  01/01/18 118/80  11/26/17 125/90   Wt Readings from Last 3 Encounters:  01/15/18 160 lb 12.8 oz (72.9 kg)  01/01/18 162 lb 3.2 oz (73.6 kg)    Physical Exam Vitals signs reviewed.  Constitutional:      Appearance: He is well-developed.  Cardiovascular:     Rate and Rhythm: Regular rhythm.     Heart sounds: Normal heart sounds.  Pulmonary:     Effort: Pulmonary effort is normal. No respiratory distress.     Breath sounds: Normal breath sounds. No wheezing, rhonchi or rales.  Skin:    General: Skin is warm and dry.  Neurological:     Mental Status: He is alert.  Psychiatric:        Speech: Speech normal.  Behavior: Behavior normal.        Assessment & Plan:   Problem List Items Addressed This Visit      Digestive   Gastroesophageal reflux disease    Symptoms largely controlled on current regimen.  Advised patient that he is a smoker and he is increased risk for esophageal cancer.  Advised he needs to establish with GI for  EGD.  He politely declines at this time.  Will discuss this at a future date.      Relevant Medications   RABEprazole (ACIPHEX) 20 MG tablet     Other   Chronic midline low back pain without sciatica - Primary    Very pleased improved.  Patient is no longer on Lyrica.  He did not like meloxicam, may try this at a later date. Will maintain him on gabapentin 600 mg 3 times daily, tramadol 100mg   twice daily.  Controlled  substance contract is in place.  Again, discussed risk medication, advised no alcohol on these medications.  Advised to store in a safe place.  Requested medical records from prior physician He politely declines referral to pain management at this time.  Follow-up in 1 month I looked up patient on [place] Controlled Substances Reporting System and saw no activity that raised concern of inappropriate use.        Relevant Medications   gabapentin (NEURONTIN) 600 MG tablet   traMADol (ULTRAM) 50 MG tablet   Anxiety    Trial low-dose Zoloft.  Discussed with patient drug interaction between tramadol, Zoloft particularly higher doses in regards to serotonin syndrome.  Discussed with pharmacist in regard to starting low-dose Zoloft, she felt appropriate.  Close follow-up.      Relevant Medications   sertraline (ZOLOFT) 50 MG tablet       I have discontinued Philip Stephens's pregabalin and traMADol. I am also having him start on sertraline, gabapentin, and traMADol. Additionally, I am having him maintain his meloxicam and RABEprazole.   Meds ordered this encounter  Medications  . sertraline (ZOLOFT) 50 MG tablet    Sig: Take 1 tablet (50 mg total) by mouth at bedtime.    Dispense:  90 tablet    Refill:  3    Order Specific Question:   Supervising Provider    Answer:   Duncan Dull L [[id number]]  . gabapentin (NEURONTIN) 600 MG tablet    Sig: Take 1 tablet (600 mg total) by mouth 3 (three) times daily.    Dispense:  90 tablet    Refill:  1    Order Specific Question:   Supervising Provider    Answer:   Duncan Dull L [[id number]]  . traMADol (ULTRAM) 50 MG tablet    Sig: Take 2 tablets (100 mg total) by mouth every 12 (twelve) hours as needed for severe pain.    Dispense:  120 tablet    Refill:  0    Order Specific Question:   Supervising Provider    Answer:   Sherlene Shams [[id number]]    Return precautions given.   Risks, benefits, and alternatives of the medications and treatment plan  prescribed today were discussed, and patient expressed understanding.   Education regarding symptom management and diagnosis given to patient on AVS.  Continue to follow with Allegra Grana, FNP for routine health maintenance.   [name] and I agreed with plan.   [name], FNP

## 2018-01-15 NOTE — Assessment & Plan Note (Signed)
Trial low-dose Zoloft.  Discussed with patient drug interaction between tramadol, Zoloft particularly higher doses in regards to serotonin syndrome.  Discussed with pharmacist in regard to starting low-dose Zoloft, she felt appropriate.  Close follow-up.

## 2018-01-15 NOTE — Assessment & Plan Note (Addendum)
Very pleased improved.  Patient is no longer on Lyrica.  He did not like meloxicam, may try this at a later date. Will maintain him on gabapentin 600 mg 3 times daily, tramadol 100mg   twice daily.  Controlled substance contract is in place.  Again, discussed risk medication, advised no alcohol on these medications.  Advised to store in a safe place.  Requested medical records from prior physician He politely declines referral to pain management at this time.  Follow-up in 1 month I looked up patient on [place] Controlled Substances Reporting System and saw no activity that raised concern of inappropriate use.

## 2018-01-15 NOTE — Assessment & Plan Note (Signed)
Symptoms largely controlled on current regimen.  Advised patient that he is a smoker and he is increased risk for esophageal cancer.  Advised he needs to establish with GI for  EGD.  He politely declines at this time.  Will discuss this at a future date.

## 2018-01-15 NOTE — Patient Instructions (Addendum)
May trial mobic down the road. Do not take with other ibuprofen, advil since an antiinflammatory.   zoloft at bedtime.   Long term use beyond 3 months of proton pump inhibitors , also called PPI's, is associated with malabsorption of vitamins, chronic kidney disease, fracture risk, and diarrheal illnesses. PPI's include Nexium, Prilosec, Protonix, Dexilant, and Prevacid.   I generally recommend trying to control acid reflux with lifestyle modifications including avoiding trigger foods, not eating 2 hours prior to bedtime. You may use histamine 2 blockers daily to twice daily ( this is Zantac, Pepcid) and then when symptoms flare, start back on PPI for short course.   Of note, we will need to do an endoscopy ( upper GI) to evaluate your esophagus, stomach as we discussed today. Let me know if you would consider in the next couple of months.   If you develop red flag symptoms: trouble swallowing, hoarseness, chronic cough, unexplained weight loss.

## 2018-01-18 ENCOUNTER — Ambulatory Visit: Payer: Managed Care, Other (non HMO) | Admitting: Nurse Practitioner

## 2018-01-18 NOTE — Progress Notes (Signed)
Prescription has been deactivated with [facility].

## 2018-01-26 ENCOUNTER — Ambulatory Visit: Payer: Managed Care, Other (non HMO) | Admitting: Family

## 2018-01-30 ENCOUNTER — Other Ambulatory Visit: Payer: Self-pay

## 2018-01-30 MED ORDER — RABEPRAZOLE SODIUM 20 MG PO TBEC: 20.0000 mg | DELAYED_RELEASE_TABLET | Freq: Every day | ORAL | 3 refills | Status: DC

## 2018-02-01 ENCOUNTER — Ambulatory Visit: Payer: Self-pay | Admitting: *Deleted

## 2018-02-01 NOTE — Telephone Encounter (Signed)
Message from Philip Stephens sent at 02/01/2018 8:10 AM EST   Summary: please advise   Pt has been having gas pains in Middle of his chest for over a week. Pt Has tried otc including baking soda, gas x with little relief. Please advise. Theres no opening at [place] location. Pt had this problem before but after he took gas x in the past he did not have this problem again until now. Pt is on prilosec         Called pt back regarding having upper gas pains in his middle chest since Monday. He had eaten an [place] sub with jalapeno. He went to bed and woke up with chest pain. He thought he was having a heart attack and he figured out it was gas. He took gas x and that helped. Yesterday had a little diarrhea. He tried to burp and did a little. And when he did he felt the burning coming up in his throat. So he drank a pepsi and burped. That helped a some. Requesting to see his provider. Appointment scheduled.  Advised to call back for increase in symptoms. Pt voiced understanding.  Routing to flow at [facility] at [facility].  Reason for Disposition . [1] MILD pain (e.g., does not interfere with normal activities) AND [2] comes and goes (cramps) AND [3] present > 72 hours  Answer Assessment - Initial Assessment Questions 1. LOCATION: "Where does it hurt?"      Mid chest 2. RADIATION: "Does the pain shoot anywhere else?" (e.g., chest, back)     no 3. ONSET: "When did the pain begin?" (e.g., minutes, hours or days ago)      monday 4. SUDDEN: "Gradual or sudden onset?"     sudden 5. PATTERN "Does the pain come and go, or is it constant?"    - If constant: "Is it getting better, staying the same, or worsening?"      (Note: Constant means the pain never goes away completely; most serious pain is constant and it progresses)     - If intermittent: "How long does it last?" "Do you have pain now?"     (Note: Intermittent means the pain goes away completely between bouts)      constant 6. SEVERITY: "How bad is the pain?"  (e.g., Scale 1-10; mild, moderate, or severe)    - MILD (1-3): doesn't interfere with normal activities, abdomen soft and not tender to touch     - MODERATE (4-7): interferes with normal activities or awakens from sleep, tender to touch     - SEVERE (8-10): excruciating pain, doubled over, unable to do any normal activities       Pain # 4 not as bad today 7. RECURRENT SYMPTOM: "Have you ever had this type of abdominal pain before?" If so, ask: "When was the last time?" and "What happened that time?"      no 8. AGGRAVATING FACTORS: "Does anything seem to cause this pain?" (e.g., foods, stress, alcohol)     Some foods, stress 9. CARDIAC SYMPTOMS: "Do you have any of the following symptoms: chest pain, difficulty breathing, sweating, nausea?"     no 10. OTHER SYMPTOMS: "Do you have any other symptoms?" (e.g., fever, vomiting, diarrhea)       no 11. PREGNANCY: "Is there any chance you are pregnant?" "When was your last menstrual period?"       n/a  Protocols used: ABDOMINAL PAIN - UPPER-A-AH

## 2018-02-01 NOTE — Telephone Encounter (Signed)
I tried to call patient, but was transferred to his VM.

## 2018-02-02 ENCOUNTER — Ambulatory Visit (INDEPENDENT_AMBULATORY_CARE_PROVIDER_SITE_OTHER): Payer: Managed Care, Other (non HMO) | Admitting: Family

## 2018-02-02 ENCOUNTER — Encounter: Payer: Self-pay | Admitting: Family

## 2018-02-02 ENCOUNTER — Telehealth: Payer: Self-pay | Admitting: Family

## 2018-02-02 VITALS — BP 112/72 | HR 91 | Temp 97.7°F | Wt 161.2 lb

## 2018-02-02 DIAGNOSIS — F419 Anxiety disorder, unspecified: Secondary | ICD-10-CM

## 2018-02-02 DIAGNOSIS — F17200 Nicotine dependence, unspecified, uncomplicated: Secondary | ICD-10-CM

## 2018-02-02 DIAGNOSIS — K219 Gastro-esophageal reflux disease without esophagitis: Secondary | ICD-10-CM

## 2018-02-02 MED ORDER — ESOMEPRAZOLE MAGNESIUM 40 MG PO CPDR: 40.0000 mg | DELAYED_RELEASE_CAPSULE | Freq: Every day | ORAL | 0 refills | Status: DC

## 2018-02-02 NOTE — Telephone Encounter (Signed)
Call patient After he had left the office today, I was thinking about his history, particularly his history of smoking.    In that setting, I think is very reasonable for [place] to consult cardiology for a stratification as aside from EKG was done emergency room 11/2017,   there are further tests including stress testing, echocardiogram the cardiologist will sometimes order depending on the patient.  Patient amenable to this?   Please also advised him that in terms of the reflux as we discussed today, if experiencing acute worsening of symptoms, or new symptoms, he would need to return to the ED.

## 2018-02-02 NOTE — Assessment & Plan Note (Addendum)
Symptoms consistent with acid reflux. Declines abdominal imaging today in our office, or GI consult for possible EGD at this time It is  reasonable to trial higher dose of Nexium since this seems to be improving symptoms over past 2 days.  Advised increase walking to help with suspected gas pain.  Patient will stay vigilant for any new or worsening symptoms.  We will consider H. pylori testing in future when patient is off PPIs.  He will let me know how he is doing.

## 2018-02-02 NOTE — Assessment & Plan Note (Signed)
Feels well off Zoloft, will continue to follow

## 2018-02-02 NOTE — Progress Notes (Signed)
Subjective:    Patient ID: Philip Stephens, male    DOB: January 24, 1975, 43 y.o.   MRN: [id number]  CC: Philip Stephens is a 43 y.o. male who presents today for an acute visit.    HPI: Epigastric pain, midsternal pressure, x one week;  improved today. Onset one week ago with eating [place] sub with jalopenos.  Burping, more flatus, sour taste in mouth.  'feels like cannot burp'  Epigastric Pressure is worse if 'eats a lot.'   No fever, n, diarrhea.   Last BM today.  Trying to avoid eating prior to bedtime, however not particularly helpful. No exercise.   NO left sided chest pain, exertional chest pain or pressure, numbness or tingling radiating to left arm or jaw, palpitations, dizziness, frequent headaches, changes in vision, or shortness of breath.    tried pepto bismal, vinegar, prilosec,  Acidphex, without much help. Gas x and Nexium is most helpful since started pas couple of days.   No longer on zoloft; feels well without.  took twice and didn't like how he felt. No si/hi.  Low back pain- On 300mg  BID ( decreased from 600mg ).   Declines EGD/ GI Consult     HISTORY:  Past Medical History:  Diagnosis Date  . Arthritis   . Hypertension    History reviewed. No pertinent surgical history. Family History  Problem Relation Age of Onset  . Hyperlipidemia Mother   . Hyperlipidemia Father   . Hearing loss Father   . Diabetes Father   . Arthritis Father     Allergies: Penicillins Current Outpatient Medications on File Prior to Visit  Medication Sig Dispense Refill  . gabapentin (NEURONTIN) 600 MG tablet Take 1 tablet (600 mg total) by mouth 3 (three) times daily. 90 tablet 1  . meloxicam (MOBIC) 7.5 MG tablet Take 1 tablet (7.5 mg total) by mouth daily as needed for pain. 90 tablet 0  . traMADol (ULTRAM) 50 MG tablet Take 2 tablets (100 mg total) by mouth every 12 (twelve) hours as needed for severe pain. 120 tablet 0   No current facility-administered medications on file  prior to visit.     Social History   Tobacco Use  . Smoking status: Current Every Day Smoker  . Smokeless tobacco: Former Engineer, water Use Topics  . Alcohol use: Not Currently  . Drug use: Never    Review of Systems  Constitutional: Negative for chills and fever.  Respiratory: Negative for cough.   Cardiovascular: Negative for chest pain and palpitations.  Gastrointestinal: Positive for abdominal pain and vomiting. Negative for abdominal distention, blood in stool, constipation and nausea.  Psychiatric/Behavioral: Negative for suicidal ideas.      Objective:    BP 112/72 (BP Location: Left Arm, Patient Position: Sitting, Cuff Size: Large)   Pulse 91   Temp 97.7 F (36.5 C)   Wt 161 lb 3.2 oz (73.1 kg)   SpO2 98%   BMI 20.70 kg/m  Wt Readings from Last 3 Encounters:  02/02/18 161 lb 3.2 oz (73.1 kg)  01/15/18 160 lb 12.8 oz (72.9 kg)  01/01/18 162 lb 3.2 oz (73.6 kg)     Physical Exam Vitals signs reviewed.  Constitutional:      Appearance: He is well-developed.  Cardiovascular:     Rate and Rhythm: Regular rhythm.     Heart sounds: Normal heart sounds.  Pulmonary:     Effort: Pulmonary effort is normal. No respiratory distress.     Breath sounds:  Normal breath sounds. No wheezing, rhonchi or rales.  Abdominal:     General: Abdomen is flat. Bowel sounds are normal.     Palpations: Abdomen is soft.     Tenderness: There is abdominal tenderness in the epigastric area. There is no rebound. Negative signs include Murphy's sign.       Comments: 'pressure' per patient with deep palpation mid sternum as noted on diagram . No pain with palpation of upper chest, left side of chest.   Skin:    General: Skin is warm and dry.  Neurological:     Mental Status: He is alert.  Psychiatric:        Speech: Speech normal.        Behavior: Behavior normal.        Assessment & Plan:   Problem List Items Addressed This Visit      Digestive   Gastroesophageal reflux  disease - Primary    Symptoms consistent with acid reflux. Declines abdominal imaging today in our office, or GI consult for possible EGD at this time It is  reasonable to trial higher dose of Nexium since this seems to be improving symptoms over past 2 days.  Advised increase walking to help with suspected gas pain.  Patient will stay vigilant for any new or worsening symptoms.  We will consider H. pylori testing in future when patient is off PPIs.  He will let me know how he is doing.      Relevant Medications   esomeprazole (NEXIUM) 40 MG capsule     Other   Anxiety    Feels well off Zoloft, will continue to follow           I have discontinued Atticus C. Rentz's sertraline and RABEprazole. I am also having him start on esomeprazole. Additionally, I am having him maintain his meloxicam, gabapentin, and traMADol.   Meds ordered this encounter  Medications  . esomeprazole (NEXIUM) 40 MG capsule    Sig: Take 1 capsule (40 mg total) by mouth daily.    Dispense:  90 capsule    Refill:  0    Order Specific Question:   Supervising Provider    Answer:   Sherlene ShamsULLO, TERESA L [[id number]]    Return precautions given.   Risks, benefits, and alternatives of the medications and treatment plan prescribed today were discussed, and patient expressed understanding.   Education regarding symptom management and diagnosis given to patient on AVS.  Continue to follow with Allegra GranaArnett, Keilynn Marano G, FNP for routine health maintenance.   Kandace Parkinsanny C Laforge and I agreed with plan.   Rennie PlowmanMargaret Orion Vandervort, FNP

## 2018-02-02 NOTE — Patient Instructions (Signed)
Short-term increase prescription of Nexium 40 mg once per day.  Guidelines are not to take this medication greater than 8 weeks.    Please follow-up with me in a couple weeks.  Refrain from caffeine. Increase exercise to help with gas    Opciones de alimentos para pacientes adultos con enfermedad de reflujo gastroesofgico Food Choices for Gastroesophageal Reflux Disease, Adult Si tiene enfermedad de reflujo gastroesofgico (ERGE), los alimentos que consume y los hbitos de alimentacin son muy importantes. Elegir los alimentos adecuados puede ayudar a Paramedic las molestias ocasionadas por la [employer]. Considere la posibilidad de trabajar con un especialista en dieta y nutricin (nutricionista) que lo ayude a Software engineer saludables. Qu pautas generales debo seguir?  Plan de alimentacin  Elija alimentos saludables con bajo contenido de grasa, como frutas, verduras, cereales integrales, productos lcteos descremados, carne magra de vaca, de pescado y de ave.  Haga comidas pequeas con frecuencia en lugar de tres comidas abundantes al da. Coma lentamente, en un ambiente distendido. Evite agacharse o recostarse hasta despus de 2 o 3horas de haber comido.  Limite los alimentos con alto contenido graso como las carnes grasas o los alimentos fritos.  Limite el consumo de [place], [place] y [place] a menos de 8 cucharaditas al Futures trader.  Evite lo siguiente: ? Consumir alimentos que le ocasionen sntomas. Pueden ser distintos para cada persona. Lleve un registro de los alimentos para identificar aquellos que le ocasionen sntomas. ? Consumir alcohol. ? Beber grandes cantidades de lquido con las comidas. ? Comer 2 o 3 horas antes de acostarse.  Cocine los alimentos utilizando mtodos que no sean la fritura. Esto puede Writer, Technical sales engineer y hervir. Estilo de vida  Mantenga un peso saludable. Pregunte a su mdico cul es el peso saludable para usted. Si debe perder peso, hable con  su mdico para hacerlo de manera segura.  Realice actividad fsica durante, al menos, 30 minutos 5 das por semana o ms, o segn lo indicado por su mdico.  Evite usar ropa ajustada alrededor de la cintura y Naval architect.  No consuma ningn producto que contenga nicotina o tabaco, como cigarrillos y Administrator, Civil Service. Si necesita ayuda para dejar de fumar, consulte al mdico.  Duerma con la cabecera de la cama elevada. Use una cua debajo del colchn o bloques debajo del armazn de la cama para Pharmacologist la cabecera de la cama elevada. Qu alimentos no se recomiendan? Esta podra no ser [employer]. Hable con el nutricionista sobre las mejores opciones alimenticias para usted. Carbohidratos Pasteles o panes sin levadura con grasa agregada. Tostadas francesas. Verduras Verduras fritas en abundante aceite. Papas fritas. Cualquier verdura que est preparada con grasa agregada. Cualquier verdura que le ocasione sntomas. Para algunas personas, estas pueden incluir tomates y productos con tomate, [place], cebollas y [place], y rbanos picantes. Frutas Cualquier fruta que est preparada con grasa agregada. Cualquier fruta que le ocasione sntomas. Para algunas personas, estas pueden incluir, las frutas ctricas como naranja, pomelo, pia y limn. Carnes y otros alimentos ricos en protenas Carnes de alto contenido graso como carne grasa de vaca o cerdo, salchichas, costillas, [place], salchicha, salame y tocino. Carnes o protenas fritas, lo que incluye pescado frito y pollo frito. Nueces y [place] de frutos secos. Lcteos [place] entera y [place] con chocolate. [employer]. Crema. Helados. Queso crema. Batidos con [employer]. Bebidas Caf y t negro, con o sin cafena. Bebidas carbonatadas. Refrescos. Bebidas energizantes. Jugo de fruta hecho con frutas cidas (como naranja o pomelo). Jugo de  tomate. Bebidas alcohlicas. Grasas y [employer]. Margarina. Lardo. Mantequilla  clarificada. Dulces y postres Chocolate y cacao. Rosquillas. Condimentos y otros alimentos [place]. Menta y mentol. Cualquier condimento, hierbas o aderezos que le ocasionen sntomas. Para algunas personas, esto puede incluir curry, salsa picante o aderezos para ensalada a base de vinagre. Resumen  Si tiene enfermedad de reflujo gastroesofgico (ERGE), las elecciones de alimentos y [place] de vida son muy importantes para ayudar a Paramedic las molestias de la [place].  Haga comidas pequeas con frecuencia en lugar de tres comidas abundantes al da. Coma lentamente, en un ambiente distendido. Evite agacharse o recostarse hasta despus de 2 o 3horas de haber comido.  Limite los alimentos con alto contenido graso como la carne grasa o los alimentos fritos. Esta informacin no tiene Theme park manager el consejo del mdico. Asegrese de hacerle al mdico cualquier pregunta que tenga. Document Released: 09/29/2004 Document Revised: 04/11/2016 Document Reviewed: 10/24/2012 Elsevier Interactive Patient Education  2019 [employer].

## 2018-02-02 NOTE — Telephone Encounter (Signed)
Philip Stephens I put ref to cardiology today fyi- [facility] would [facility]

## 2018-02-05 ENCOUNTER — Telehealth: Payer: Self-pay | Admitting: Family

## 2018-02-05 ENCOUNTER — Encounter: Payer: Self-pay | Admitting: Family

## 2018-02-05 DIAGNOSIS — K219 Gastro-esophageal reflux disease without esophagitis: Secondary | ICD-10-CM

## 2018-02-05 NOTE — Progress Notes (Signed)
Patient stated that he is still having trouble with gas pains. He felt good yesterday & was able to belch. Today he is unable to & is again feeling poorly. He would like a chest xray as well as MRI. Patient stated that he wiuld call back if he felt that appointment needed to be moved up any sooner than 2/14.

## 2018-02-05 NOTE — Progress Notes (Signed)
LMTCB

## 2018-02-05 NOTE — Telephone Encounter (Signed)
Spoke with patient  Feels a lot of gas.  No abdominal pain, constipation, CP, sob, cough.   On the nexium 40mg  and 'can tell a difference. ' Has been on prilosec, ranitidine in the past  Ate pizza yesterday.   Advised we should pursue GI consult. Stay on nexium, he will start pepcid otc  We jointly agreed that no imaging including CT abdomen or CXR at this time due to symptoms.  He will let me know of any new or worsening symptoms.

## 2018-02-06 ENCOUNTER — Encounter: Payer: Self-pay | Admitting: Gastroenterology

## 2018-02-06 ENCOUNTER — Encounter: Payer: Self-pay | Admitting: Family

## 2018-02-07 ENCOUNTER — Telehealth: Payer: Self-pay | Admitting: Lab

## 2018-02-07 NOTE — Telephone Encounter (Signed)
Called Pt and he stated he is feeling much better, He stated he went to see his heart Dr in [facility] yesterday and everything was good. He stated by changing his diet the Genella Rife has been better and his Laurette Schimke appt is for 3/19.

## 2018-02-07 NOTE — Telephone Encounter (Signed)
Call pt How is doing? When is cardiology appt?  Are his sxs improved with diet changes for gerd?

## 2018-02-08 NOTE — Telephone Encounter (Signed)
Patient saw cardiology on 02/06/18 and sees GI tomorrow. Patient has been staying in the hospital with his son. But he is feeling better. Cardiology says that ECG and blood work , and is having for ECHO but feels his heart is fine. Patient is starting the Nexium to night.

## 2018-02-09 NOTE — Telephone Encounter (Signed)
Noted. Glad to hear

## 2018-02-12 ENCOUNTER — Other Ambulatory Visit: Payer: Self-pay | Admitting: Family

## 2018-02-12 DIAGNOSIS — M545 Low back pain, unspecified: Secondary | ICD-10-CM

## 2018-02-12 DIAGNOSIS — G8929 Other chronic pain: Secondary | ICD-10-CM

## 2018-02-12 NOTE — Telephone Encounter (Signed)
Has seen cardiology 02/06/18

## 2018-02-12 NOTE — Telephone Encounter (Signed)
Refilled: 01/15/2018 Last OV: 02/02/2018 Next OV: 02/16/2018

## 2018-02-14 NOTE — Telephone Encounter (Signed)
I looked up patient on [place] Controlled Substances Reporting System and saw no activity that raised concern of inappropriate use.   

## 2018-02-15 ENCOUNTER — Other Ambulatory Visit: Payer: Self-pay

## 2018-02-15 DIAGNOSIS — M545 Low back pain, unspecified: Secondary | ICD-10-CM

## 2018-02-15 DIAGNOSIS — G8929 Other chronic pain: Secondary | ICD-10-CM

## 2018-02-16 ENCOUNTER — Encounter: Payer: Self-pay | Admitting: Family

## 2018-02-16 ENCOUNTER — Ambulatory Visit (INDEPENDENT_AMBULATORY_CARE_PROVIDER_SITE_OTHER): Payer: Managed Care, Other (non HMO) | Admitting: Family

## 2018-02-16 DIAGNOSIS — G8929 Other chronic pain: Secondary | ICD-10-CM

## 2018-02-16 DIAGNOSIS — K219 Gastro-esophageal reflux disease without esophagitis: Secondary | ICD-10-CM

## 2018-02-16 DIAGNOSIS — M545 Low back pain, unspecified: Secondary | ICD-10-CM

## 2018-02-16 NOTE — Progress Notes (Signed)
Subjective:    Patient ID: Philip Stephens, male    DOB: 1975/02/19, 43 y.o.   MRN: [id number]  CC: [name] is a 43 y.o. male who presents today for follow up.   HPI: Feels much better today  Epigastric pain has improved. No N, vomiting, CP.   Taking tramadol and nexium 40mg  BID is working well. No longer on gabapentin. Has seen GI, suspected gastritis from NSAID use. Has limited NSAID, takes [facility] Powder daily. Not on mobic. Taking pepto bismal BID.   States nexium has helped. Ate [employer] last night  Stopped smoking one week ago.   Low back pain- improved. Stopped gabapentin. Continues to use tramadol.   Has seen cardiology, Dr Sharyn Lull; pending echocardiogram. Felt reassured.     HISTORY:  Past Medical History:  Diagnosis Date  . Arthritis   . Hypertension    History reviewed. No pertinent surgical history. Family History  Problem Relation Age of Onset  . Hyperlipidemia Mother   . Hyperlipidemia Father   . Hearing loss Father   . Diabetes Father   . Arthritis Father     Allergies: Penicillins Current Outpatient Medications on File Prior to Visit  Medication Sig Dispense Refill  . esomeprazole (NEXIUM) 40 MG capsule Take 1 capsule (40 mg total) by mouth daily. 90 capsule 0  . traMADol (ULTRAM) 50 MG tablet TAKE TWO TABLETS EVERY 12 HOURS AS NEEDED FOR PAIN 120 tablet 0   No current facility-administered medications on file prior to visit.     Social History   Tobacco Use  . Smoking status: Current Every Day Smoker  . Smokeless tobacco: Former Engineer, water Use Topics  . Alcohol use: Not Currently  . Drug use: Never    Review of Systems  Constitutional: Negative for chills and fever.  Respiratory: Negative for cough.   Cardiovascular: Negative for chest pain and palpitations.  Gastrointestinal: Negative for abdominal distention, abdominal pain, anal bleeding, constipation, nausea and vomiting.  Genitourinary: Negative for dysuria.    Musculoskeletal: Positive for back pain.      Objective:    BP 122/76 (BP Location: Left Arm, Patient Position: Sitting, Cuff Size: Large)   Pulse 91   Temp 98 F (36.7 C)   Wt 160 lb 9.6 oz (72.8 kg)   SpO2 97%   BMI 20.62 kg/m  BP Readings from Last 3 Encounters:  02/16/18 122/76  02/02/18 112/72  01/15/18 124/82   Wt Readings from Last 3 Encounters:  02/16/18 160 lb 9.6 oz (72.8 kg)  02/02/18 161 lb 3.2 oz (73.1 kg)  01/15/18 160 lb 12.8 oz (72.9 kg)    Physical Exam Vitals signs reviewed.  Constitutional:      Appearance: He is well-developed.  Cardiovascular:     Rate and Rhythm: Regular rhythm.     Heart sounds: Normal heart sounds.  Pulmonary:     Effort: Pulmonary effort is normal. No respiratory distress.     Breath sounds: Normal breath sounds. No wheezing, rhonchi or rales.  Skin:    General: Skin is warm and dry.  Neurological:     Mental Status: He is alert.  Psychiatric:        Speech: Speech normal.        Behavior: Behavior normal.        Assessment & Plan:   Problem List Items Addressed This Visit      Digestive   Gastroesophageal reflux disease    Symptoms greatly improved since nexium  40mg  BID. Advised short course Nexium 40mg  BID then to return to 40mg   QD. Advised NO NSAIDS, caffeine, alcohol. Will follow.         Other   Chronic midline low back pain without sciatica    Stable at baseline. Will continue tramadol 100mg  BID prn.           I have discontinued Silvino C. Kinyon's meloxicam and gabapentin. I am also having him maintain his esomeprazole and traMADol.   No orders of the defined types were placed in this encounter.   Return precautions given.   Risks, benefits, and alternatives of the medications and treatment plan prescribed today were discussed, and patient expressed understanding.   Education regarding symptom management and diagnosis given to patient on AVS.  Continue to follow with Allegra Grana,  FNP for routine health maintenance.   [name] and I agreed with plan.   [name], FNP

## 2018-02-16 NOTE — Patient Instructions (Addendum)
So nice to see you!  Let me know if you need anything else.

## 2018-02-16 NOTE — Assessment & Plan Note (Addendum)
Symptoms greatly improved since nexium 40mg  BID. Advised short course Nexium 40mg  BID then to return to 40mg   QD. Advised NO NSAIDS, caffeine, alcohol. Will follow.

## 2018-02-16 NOTE — Assessment & Plan Note (Signed)
Stable at baseline. Will continue tramadol 100mg  BID prn.

## 2018-02-22 ENCOUNTER — Encounter: Payer: Self-pay | Admitting: Family

## 2018-02-23 ENCOUNTER — Ambulatory Visit: Payer: Managed Care, Other (non HMO) | Admitting: Family

## 2018-03-14 ENCOUNTER — Other Ambulatory Visit: Payer: Self-pay

## 2018-03-14 ENCOUNTER — Encounter: Payer: Self-pay | Admitting: Family

## 2018-03-14 ENCOUNTER — Ambulatory Visit (INDEPENDENT_AMBULATORY_CARE_PROVIDER_SITE_OTHER): Payer: Managed Care, Other (non HMO) | Admitting: Family

## 2018-03-14 VITALS — BP 108/70 | HR 95 | Temp 98.5°F | Wt 153.4 lb

## 2018-03-14 DIAGNOSIS — M545 Low back pain, unspecified: Secondary | ICD-10-CM

## 2018-03-14 DIAGNOSIS — K219 Gastro-esophageal reflux disease without esophagitis: Secondary | ICD-10-CM | POA: Diagnosis not present

## 2018-03-14 DIAGNOSIS — G8929 Other chronic pain: Secondary | ICD-10-CM

## 2018-03-14 DIAGNOSIS — R5383 Other fatigue: Secondary | ICD-10-CM | POA: Diagnosis not present

## 2018-03-14 DIAGNOSIS — F419 Anxiety disorder, unspecified: Secondary | ICD-10-CM | POA: Diagnosis not present

## 2018-03-14 MED ORDER — TRAMADOL HCL 50 MG PO TABS: ORAL_TABLET | ORAL | 1 refills | Status: DC

## 2018-03-14 MED ORDER — PREGABALIN 75 MG PO CAPS: ORAL_CAPSULE | ORAL | 0 refills | Status: DC

## 2018-03-14 MED ORDER — BUSPIRONE HCL 7.5 MG PO TABS: 7.5000 mg | ORAL_TABLET | Freq: Two times a day (BID) | ORAL | 2 refills | Status: AC

## 2018-03-14 NOTE — Assessment & Plan Note (Signed)
Breakthrough symptoms . patient will continue tramadol as been prescribed.  Will restart Lyrica and titrate back up.

## 2018-03-14 NOTE — Assessment & Plan Note (Signed)
Suspect multifactorial including poor sleep quality, lack of exercise.  Pending labs at this time.

## 2018-03-14 NOTE — Assessment & Plan Note (Addendum)
Largely improved, however very concerned with use  NSAIDs. Long conversation regards to this especially in the setting of suspected peptic ulcer disease.  Patient will stop Goody's powder, ibuprofen as of today.  Strongly advised patient to speak with GI in regards to EGD.   He will call and schedule his follow-up with GI , he verbalized understanding of all

## 2018-03-14 NOTE — Patient Instructions (Addendum)
Stop IBUPROFEN, BC Powder. This is making suspected ulcer, gastritis worse.   We will restart lyrica.  Please follow titration instructions, you have to start the medication at 75 mg twice a day.  You then can move to 150 mg twice a day the next week.    The following week, you may move to 150 mg 3 times a day.  Please note that if you  were to stop this medication, you would wean down in the same fashion   Please let me know if any questions during this period.  Trial of buspar AFTER 3 weeks of being on Lyrica  Let me know how you are doing

## 2018-03-14 NOTE — Progress Notes (Signed)
Subjective:    Patient ID: Philip Stephens, male    DOB: 1975/05/24, 43 y.o.   MRN: [id number]  CC: Philip Stephens is a 43 y.o. male who presents today for follow up.   HPI: Complains of worrying more of late. 'worrying all the time.'  Worries about son, brother who recently lost his job. No panic attacks. No si/hi. Didn't do well on xanax prior PCP, didn't like Zoloft.   Couple of weeks ago, had general body aches. Resolved now.  Low back pain- bothering more lately. Had 'some real good months.'  No longer on gabapentin or lyrica, and since then, has nocticed 'legs dont stop moving' at night.   Fatigue. Not exercising like he used too. Works for [employer] in [employer]. Not sleeping well. Goes to bed at 12, waking up off and on.   GERD- Improved.  Occasionally has epigastric discomfort.  No abdominal pain today.  Had pizza last week, had pizza, flared up.  40mg  nexium BID. No constipation. Still on ibuprofen, 800mg  TID.   hasnt smoked in 7 weeks.         HISTORY:  Past Medical History:  Diagnosis Date  . Arthritis   . Hypertension    History reviewed. No pertinent surgical history. Family History  Problem Relation Age of Onset  . Hyperlipidemia Mother   . Hyperlipidemia Father   . Hearing loss Father   . Diabetes Father   . Arthritis Father     Allergies: Penicillins Current Outpatient Medications on File Prior to Visit  Medication Sig Dispense Refill  . esomeprazole (NEXIUM) 40 MG capsule Take 1 capsule (40 mg total) by mouth daily. (Patient taking differently: Take 40 mg by mouth 2 (two) times daily before a meal. Take two tablets by mouth before breakfast & two tablets before dinner.) 90 capsule 0   No current facility-administered medications on file prior to visit.     Social History   Tobacco Use  . Smoking status: Current Every Day Smoker  . Smokeless tobacco: Former Engineer, water Use Topics  . Alcohol use: Not Currently  . Drug use: Never     Review of Systems  Constitutional: Positive for fatigue. Negative for appetite change, chills and fever.  HENT: Positive for trouble swallowing.   Respiratory: Negative for cough.   Cardiovascular: Negative for chest pain and palpitations.  Gastrointestinal: Positive for abdominal pain. Negative for constipation, nausea and vomiting.  Musculoskeletal: Positive for back pain.      Objective:    BP 108/70 (BP Location: Left Arm, Patient Position: Sitting, Cuff Size: Large)   Pulse 95   Temp 98.5 F (36.9 C)   Wt 153 lb 6.4 oz (69.6 kg)   SpO2 97%   BMI 19.70 kg/m  BP Readings from Last 3 Encounters:  03/14/18 108/70  02/16/18 122/76  02/02/18 112/72   Wt Readings from Last 3 Encounters:  03/14/18 153 lb 6.4 oz (69.6 kg)  02/16/18 160 lb 9.6 oz (72.8 kg)  02/02/18 161 lb 3.2 oz (73.1 kg)    Physical Exam Vitals signs reviewed.  Constitutional:      Appearance: Normal appearance. He is well-developed.  Cardiovascular:     Rate and Rhythm: Regular rhythm.     Heart sounds: Normal heart sounds.  Pulmonary:     Effort: Pulmonary effort is normal. No respiratory distress.     Breath sounds: Normal breath sounds. No wheezing or rales.  Abdominal:     General: Bowel sounds are  normal. There is no distension.     Palpations: Abdomen is soft. Abdomen is not rigid. There is no fluid wave or mass.     Tenderness: There is no abdominal tenderness. There is no guarding or rebound. Negative signs include Murphy's sign and McBurney's sign.  Skin:    General: Skin is warm and dry.  Neurological:     Mental Status: He is alert.  Psychiatric:        Speech: Speech normal.        Behavior: Behavior normal.        Assessment & Plan:   Problem List Items Addressed This Visit      Digestive   Gastroesophageal reflux disease    Largely improved, however very concerned with use  NSAIDs. Long conversation regards to this especially in the setting of suspected peptic ulcer disease.   Patient will stop [employer] powder, ibuprofen as of today.  Strongly advised patient to speak with GI in regards to EGD.   He will call and schedule his follow-up with GI , he verbalized understanding of all        Other   Chronic midline low back pain without sciatica    Breakthrough symptoms . patient will continue tramadol as been prescribed.  Will restart Lyrica and titrate back up.      Relevant Medications   pregabalin (LYRICA) 75 MG capsule   traMADol (ULTRAM) 50 MG tablet   Anxiety    Trial of BuSpar once patient has been on Lyrica again for 3 weeks.  Close follow-up      Relevant Medications   busPIRone (BUSPAR) 7.5 MG tablet   Fatigue - Primary    Suspect multifactorial including poor sleep quality, lack of exercise.  Pending labs at this time.      Relevant Orders   CBC with Differential/Platelet   Comprehensive metabolic panel   Hemoglobin A1c   Lipid panel   TSH   VITAMIN D 25 Hydroxy (Vit-D Deficiency, Fractures)       I am having Cordaryl C. Meckel start on pregabalin and busPIRone. I am also having him maintain his esomeprazole and traMADol.   Meds ordered this encounter  Medications  . pregabalin (LYRICA) 75 MG capsule    Sig: For first week, take  BID, then increase to two tablets twice per day for one week, then increase to two tablets 3 times per day.    Dispense:  180 capsule    Refill:  0    Order Specific Question:   Supervising Provider    Answer:   Duncan Dull L [[id number]]  . busPIRone (BUSPAR) 7.5 MG tablet    Sig: Take 1 tablet (7.5 mg total) by mouth 2 (two) times daily for 30 days.    Dispense:  60 tablet    Refill:  2    Order Specific Question:   Supervising Provider    Answer:   Duncan Dull L [[id number]]  . traMADol (ULTRAM) 50 MG tablet    Sig: TAKE TWO TABLETS EVERY 12 HOURS AS NEEDED FOR PAIN    Dispense:  120 tablet    Refill:  1    May fill on 02/15/2018 or later. Last Rx filled 01/16/2018.    Order Specific Question:   Supervising  Provider    Answer:   Sherlene Shams [[id number]]    Return precautions given.   Risks, benefits, and alternatives of the medications and treatment plan prescribed today were discussed, and patient expressed understanding.  Education regarding symptom management and diagnosis given to patient on AVS.  Continue to follow with Allegra Grana, FNP for routine health maintenance.   [name] and I agreed with plan.   [name], FNP

## 2018-03-14 NOTE — Assessment & Plan Note (Signed)
Trial of BuSpar once patient has been on Lyrica again for 3 weeks.  Close follow-up

## 2018-03-15 ENCOUNTER — Encounter: Payer: Self-pay | Admitting: Family

## 2018-03-22 ENCOUNTER — Ambulatory Visit: Payer: Managed Care, Other (non HMO) | Admitting: Gastroenterology

## 2018-04-03 ENCOUNTER — Encounter: Payer: Self-pay | Admitting: Family

## 2018-04-04 ENCOUNTER — Encounter: Payer: Self-pay | Admitting: Family

## 2018-04-06 ENCOUNTER — Encounter: Payer: Self-pay | Admitting: Family

## 2018-04-06 ENCOUNTER — Telehealth: Payer: Self-pay

## 2018-04-06 MED ORDER — PREGABALIN 150 MG PO CAPS: 150.0000 mg | ORAL_CAPSULE | Freq: Two times a day (BID) | ORAL | 1 refills | Status: DC

## 2018-04-06 NOTE — Telephone Encounter (Signed)
I looked up patient on [place] Controlled Substances Reporting System and saw no activity that raised concern of inappropriate use.  Refilled lyrica

## 2018-04-15 ENCOUNTER — Encounter: Payer: Self-pay | Admitting: Family

## 2018-04-18 ENCOUNTER — Encounter: Payer: Self-pay | Admitting: Family

## 2018-04-18 ENCOUNTER — Ambulatory Visit (INDEPENDENT_AMBULATORY_CARE_PROVIDER_SITE_OTHER): Payer: Managed Care, Other (non HMO) | Admitting: Family

## 2018-04-18 DIAGNOSIS — R200 Anesthesia of skin: Secondary | ICD-10-CM | POA: Diagnosis not present

## 2018-04-18 DIAGNOSIS — K219 Gastro-esophageal reflux disease without esophagitis: Secondary | ICD-10-CM | POA: Diagnosis not present

## 2018-04-18 NOTE — Assessment & Plan Note (Signed)
Waxing and waning improvement.  Patient is well-appearing today, he is nontoxic in appearance.  He is not experiencing any pain during our visit. discussed with patient at length and actually I suspect he is not taking his Nexium appropriately.  Advised him to take Nexium 1 hour before breakfast and 1 hour before dinnertime.  I think this may be why the medication not quite as effective.  If this does not further help with symptoms, I may adjunct with Pepcid AC.  Also advised him strongly against restarting the [facility] powder, ibuprofen.  He will pay close attention let me know how he is doing.  If no improvement, patient will let me know

## 2018-04-18 NOTE — Progress Notes (Signed)
This visit type was conducted due to national recommendations for restrictions regarding the COVID-19 pandemic (e.g. social distancing).  This format is felt to be most appropriate for this patient at this time.  All issues noted in this document were discussed and addressed.  No physical exam was performed (except for noted visual exam findings with Video Visits). Virtual Visit via Video Note  I connected with@  on 04/18/18 at 11:30 AM EDT by a video enabled telemedicine application and verified that I am speaking with the correct person using two identifiers.  Location patient: home Location provider:work  Persons participating in the virtual visit: patient, provider  I discussed the limitations of evaluation and management by telemedicine and the availability of in person appointments. The patient expressed understanding and agreed to proceed.   HPI:  Epigastric pain has gotten worse over past week, not as bad 'as once was.' Feels fine 'right now.'  Pain occurs middle or to right side of chest after eats.  No pain when he is not eating.  Increased pain over the past week does correlate with starting back on BC powder and ibuprofen on nexium 40mg  BID first thing in morning and also in the afternoon "when he remembers"  Awaiting until virus is better until EGD with GI. Avoiding all tomatoes as that is trigger.   Has started for honey and vinegar with some relief. On nexium   Has stopped BC and ibuprofen however this week with weather changing has started back to take again due to chronic back pain.   Has had a couple  episodes over the past 2 weeks which he notices from his elbow down to his fingers to wake up from sleep being numb.  It resolves after a few minutes . Only occurs when sleeping. Sleeps with arms bent. Doesn't occur during the day or with activity. No weakness in upper extremities.     ROS: See pertinent positives and negatives per HPI.  Past Medical History:  Diagnosis  Date  . Arthritis   . Hypertension     History reviewed. No pertinent surgical history.  Family History  Problem Relation Age of Onset  . Hyperlipidemia Mother   . Hyperlipidemia Father   . Hearing loss Father   . Diabetes Father   . Arthritis Father     SOCIAL HX: former smoker  Current Outpatient Medications:  .  esomeprazole (NEXIUM) 40 MG capsule, Take 1 capsule (40 mg total) by mouth daily. (Patient taking differently: Take 40 mg by mouth 2 (two) times daily before a meal. Take two tablets by mouth before breakfast & two tablets before dinner.), Disp: 90 capsule, Rfl: 0 .  pregabalin (LYRICA) 150 MG capsule, Take 1 capsule (150 mg total) by mouth 2 (two) times daily., Disp: 60 capsule, Rfl: 1 .  traMADol (ULTRAM) 50 MG tablet, TAKE TWO TABLETS EVERY 12 HOURS AS NEEDED FOR PAIN, Disp: 120 tablet, Rfl: 1  EXAM:  VITALS per patient if applicable:  GENERAL: alert, oriented, appears well and in no acute distress  HEENT: atraumatic, conjunttiva clear, no obvious abnormalities on inspection of external nose and ears  NECK: normal movements of the head and neck  LUNGS: on inspection no signs of respiratory distress, breathing rate appears normal, no obvious gross SOB, gasping or wheezing  CV: no obvious cyanosis  MS: moves all visible extremities without noticeable abnormality  PSYCH/NEURO: pleasant and cooperative, no obvious depression or anxiety, speech and thought processing grossly intact  ASSESSMENT AND PLAN:  Discussed  the following assessment and plan:  Gastroesophageal reflux disease, esophagitis presence not specified  Numbness in both hands - Plan: B12 and Folate Panel Problem List Items Addressed This Visit      Digestive   Gastroesophageal reflux disease - Primary    Waxing and waning improvement.  Patient is well-appearing today, he is nontoxic in appearance.  He is not experiencing any pain during our visit. discussed with patient at length and actually  I suspect he is not taking his Nexium appropriately.  Advised him to take Nexium 1 hour before breakfast and 1 hour before dinnertime.  I think this may be why the medication not quite as effective.  If this does not further help with symptoms, I may adjunct with Pepcid AC.  Also advised him strongly against restarting the [facility] powder, ibuprofen.  He will pay close attention let me know how he is doing.  If no improvement, patient will let me know        Other   Numbness in both hands    New, self-limiting.  Only occurs with sleep.  Limited in exam obviously today.  Etiology nonspecific at this time . discussed with patient differentials including a nerve impingement at the elbow, and/or carpal tunnel particularly with his legs and his arms being bent with sleep.  Advised him to monitor the symptoms or let me know if it were to increase.  Pending labs at this time particularly TSH, B12 and folate.  Patient let me know how he is doing.      Relevant Orders   B12 and Folate Panel         I discussed the assessment and treatment plan with the patient. The patient was provided an opportunity to ask questions and all were answered. The patient agreed with the plan and demonstrated an understanding of the instructions.   The patient was advised to call back or seek an in-person evaluation if the symptoms worsen or if the condition fails to improve as anticipated.   Rennie PlowmanMargaret , FNP

## 2018-04-18 NOTE — Patient Instructions (Addendum)
Take nexium ONE hour prior breakfast and dinner. Let me  Know if this helps and certainly if new or worsening symptoms.   Stop BC powder, ibuprofen; really this is likely  Let me know   Labs as scheduled.

## 2018-04-18 NOTE — Assessment & Plan Note (Addendum)
New, self-limiting.  Only occurs with sleep.  Limited in exam obviously today.  Etiology nonspecific at this time . discussed with patient differentials including a nerve impingement at the elbow, and/or carpal tunnel particularly with his legs and his arms being bent with sleep.  Advised him to monitor the symptoms or let me know if it were to increase.  Pending labs at this time particularly TSH, B12 and folate.  Patient let me know how he is doing.

## 2018-04-19 ENCOUNTER — Other Ambulatory Visit: Payer: Self-pay

## 2018-04-19 ENCOUNTER — Other Ambulatory Visit (INDEPENDENT_AMBULATORY_CARE_PROVIDER_SITE_OTHER): Payer: Managed Care, Other (non HMO)

## 2018-04-19 DIAGNOSIS — R200 Anesthesia of skin: Secondary | ICD-10-CM

## 2018-04-19 DIAGNOSIS — R5383 Other fatigue: Secondary | ICD-10-CM

## 2018-04-19 LAB — TSH: TSH: 3.62 u[IU]/mL (ref 0.35–4.50)

## 2018-04-19 LAB — CBC WITH DIFFERENTIAL/PLATELET
Basophils Absolute: 0 10*3/uL (ref 0.0–0.1)
Basophils Relative: 0.5 % (ref 0.0–3.0)
Eosinophils Absolute: 0.3 10*3/uL (ref 0.0–0.7)
Eosinophils Relative: 3.3 % (ref 0.0–5.0)
HCT: 41.2 % (ref 39.0–52.0)
Hemoglobin: 13.6 g/dL (ref 13.0–17.0)
Lymphocytes Relative: 35.1 % (ref 12.0–46.0)
Lymphs Abs: 2.7 10*3/uL (ref 0.7–4.0)
MCHC: 33.1 g/dL (ref 30.0–36.0)
MCV: 89.4 fl (ref 78.0–100.0)
Monocytes Absolute: 0.6 10*3/uL (ref 0.1–1.0)
Monocytes Relative: 7.5 % (ref 3.0–12.0)
Neutro Abs: 4.2 10*3/uL (ref 1.4–7.7)
Neutrophils Relative %: 53.6 % (ref 43.0–77.0)
Platelets: 296 10*3/uL (ref 150.0–400.0)
RBC: 4.61 Mil/uL (ref 4.22–5.81)
RDW: 14.6 % (ref 11.5–15.5)
WBC: 7.8 10*3/uL (ref 4.0–10.5)

## 2018-04-19 LAB — COMPREHENSIVE METABOLIC PANEL
ALT: 15 U/L (ref 0–53)
AST: 17 U/L (ref 0–37)
Albumin: 4.5 g/dL (ref 3.5–5.2)
Alkaline Phosphatase: 99 U/L (ref 39–117)
BUN: 16 mg/dL (ref 6–23)
CO2: 29 mEq/L (ref 19–32)
Calcium: 9.3 mg/dL (ref 8.4–10.5)
Chloride: 99 mEq/L (ref 96–112)
Creatinine, Ser: 0.94 mg/dL (ref 0.40–1.50)
GFR: 87.33 mL/min (ref >60.00)
Glucose, Bld: 124 mg/dL — ABNORMAL HIGH (ref 70–99)
Potassium: 4.2 mEq/L (ref 3.5–5.1)
Sodium: 138 mEq/L (ref 135–145)
Total Bilirubin: 0.4 mg/dL (ref 0.2–1.2)
Total Protein: 6.9 g/dL (ref 6.0–8.3)

## 2018-04-19 LAB — LDL CHOLESTEROL, DIRECT: Direct LDL: 150 mg/dL

## 2018-04-19 LAB — LIPID PANEL
Cholesterol: 247 mg/dL — ABNORMAL HIGH (ref 0–200)
HDL: 44.9 mg/dL (ref >39.00)
NonHDL: 202.48
Total CHOL/HDL Ratio: 6
Triglycerides: 240 mg/dL — ABNORMAL HIGH (ref 0.0–149.0)
VLDL: 48 mg/dL — ABNORMAL HIGH (ref 0.0–40.0)

## 2018-04-19 LAB — VITAMIN D 25 HYDROXY (VIT D DEFICIENCY, FRACTURES): VITD: 29.96 ng/mL — ABNORMAL LOW (ref 30.00–100.00)

## 2018-04-19 LAB — B12 AND FOLATE PANEL
Folate: 14.7 ng/mL (ref >5.9)
Vitamin B-12: 459 pg/mL (ref 211–911)

## 2018-04-19 LAB — HEMOGLOBIN A1C: Hgb A1c MFr Bld: 6.2 % (ref 4.6–6.5)

## 2018-04-23 ENCOUNTER — Encounter: Payer: Self-pay | Admitting: Family

## 2018-04-25 ENCOUNTER — Telehealth: Payer: Self-pay

## 2018-04-25 ENCOUNTER — Encounter: Payer: Self-pay | Admitting: Family

## 2018-04-25 NOTE — Telephone Encounter (Signed)
Copied from CRM [id number]. Topic: General - Other >> Apr 24, 2018  4:15 PM Jay Schlichter wrote: Reason for [id number]:[phone] Please call pt about labs  Thanks

## 2018-04-25 NOTE — Telephone Encounter (Signed)
I have sent mychart message response.

## 2018-05-02 ENCOUNTER — Encounter: Payer: Self-pay | Admitting: Family

## 2018-05-05 ENCOUNTER — Other Ambulatory Visit: Payer: Self-pay | Admitting: Family

## 2018-05-05 DIAGNOSIS — M545 Low back pain, unspecified: Secondary | ICD-10-CM

## 2018-05-05 DIAGNOSIS — G8929 Other chronic pain: Secondary | ICD-10-CM

## 2018-05-07 ENCOUNTER — Other Ambulatory Visit: Payer: Self-pay

## 2018-05-07 NOTE — Telephone Encounter (Signed)
I looked up patient on  Controlled Substances Reporting System and saw no activity that raised concern of inappropriate use.   refilled 

## 2018-05-08 ENCOUNTER — Encounter: Payer: Self-pay | Admitting: Family

## 2018-05-15 ENCOUNTER — Encounter: Payer: Self-pay | Admitting: Family

## 2018-05-21 ENCOUNTER — Encounter: Payer: Self-pay | Admitting: Family

## 2018-05-23 NOTE — Progress Notes (Signed)
This visit type was conducted due to national recommendations for restrictions regarding the COVID-19 pandemic (e.g. social distancing).  This format is felt to be most appropriate for this patient at this time.  All issues noted in this document were discussed and addressed.  No physical exam was performed (except for noted visual exam findings with Video Visits). Virtual Visit via Video Note  I connected with@  on 05/25/18 at 10:30 AM EDT by a video enabled telemedicine application and verified that I am speaking with the correct person using two identifiers.  Location patient: home Location provider:work  Persons participating in the virtual visit: patient, provider  I discussed the limitations of evaluation and management by telemedicine and the availability of in person appointments. The patient expressed understanding and agreed to proceed.   HPI:  Has 'felt great'  this past week, energy has improved.   Had an episode on right side of chest a week ago after eating pizza.  Denies any associated diaphoresis, nausea, left arm pain, shortness of breath, exertional chest pain.  Has since started carafate and pain has resolved.   Feels well with activity such as swinging 5lb hammer which he has done this week, states no cp, sob.   Low back pain-unchanged. Notes that is exacerbated by rain.  Doing well on tramadol , lyrica regimen.   Interested in Tramadol ER as was easier in the past, less pill burden. Has been on the past.   Does want to any imaging of chest.  CXR 11/2017 in ED - no acute findings.   GI consult 05/16/18.   Does Velo. ( has nicotine , no tobacco).   Former smoker.   Trouble sustaining erection over past month. Never had this problem in past. Drinks 4 ounces of red wine before bed. Thinks stress contributory. No penile discharge, penile pain or swelling. No testicular pain, trouble urinating.   No h/o MI.   ROS: See pertinent positives and negatives per  HPI.  Past Medical History:  Diagnosis Date  . Arthritis   . Hypertension     History reviewed. No pertinent surgical history.  Family History  Problem Relation Age of Onset  . Hyperlipidemia Mother   . Hyperlipidemia Father   . Hearing loss Father   . Diabetes Father   . Arthritis Father     SOCIAL HX: former smoker   Current Outpatient Medications:  .  esomeprazole (NEXIUM) 40 MG capsule, Take 1 capsule (40 mg total) by mouth daily. (Patient taking differently: Take 40 mg by mouth 2 (two) times daily before a meal. Take two tablets by mouth before breakfast & two tablets before dinner.), Disp: 90 capsule, Rfl: 0 .  pregabalin (LYRICA) 150 MG capsule, Take 1 capsule (150 mg total) by mouth 2 (two) times daily., Disp: 60 capsule, Rfl: 1 .  sucralfate (CARAFATE) 1 g tablet, Take 1 tablet by mouth 2 (two) times daily., Disp: , Rfl:  .  sildenafil (VIAGRA) 50 MG tablet, Take 0.5 tablets (25 mg total) by mouth daily as needed for erectile dysfunction (take 0.5-4h before sexual activity)., Disp: 10 tablet, Rfl: 2 .  traMADol (ULTRAM-ER) 100 MG 24 hr tablet, Take 1 tablet (100 mg total) by mouth daily., Disp: 60 tablet, Rfl: 1  EXAM:  VITALS per patient if applicable:  GENERAL: alert, oriented, appears well and in no acute distress  HEENT: atraumatic, conjunttiva clear, no obvious abnormalities on inspection of external nose and ears  NECK: normal movements of the head and neck  LUNGS: on inspection no signs of respiratory distress, breathing rate appears normal, no obvious gross SOB, gasping or wheezing  CV: no obvious cyanosis  MS: moves all visible extremities without noticeable abnormality  PSYCH/NEURO: pleasant and cooperative, no obvious depression or anxiety, speech and thought processing grossly intact  ASSESSMENT AND PLAN:  Discussed the following assessment and plan:  Chronic midline low back pain without sciatica - Plan: traMADol (ULTRAM-ER) 100 MG 24 hr  tablet, pregabalin (LYRICA) 150 MG capsule  Gastroesophageal reflux disease, esophagitis presence not specified  Erectile dysfunction, unspecified erectile dysfunction type - Plan: sildenafil (VIAGRA) 50 MG tablet Problem List Items Addressed This Visit      Digestive   Gastroesophageal reflux disease    Improved with carafate. No symptoms today. He will continue to follow with GI. Declines any imaging of chest or abdomen today.       Relevant Medications   sucralfate (CARAFATE) 1 g tablet     Other   Chronic midline low back pain without sciatica - Primary    Chronic, unchanged.  Advised patient that he would prefer to take tramadol ER that seems reasonable.  I did ask that he takes tramadol 100 mg for 5 days as we are changing formulations.  After 5 days he may increased dose to 200 mg ER daily.  Next refill I will refill at 200 mg extended release.  We will continue Lyrica. He will let me know how he is doing  I looked up patient on  Controlled Substances Reporting System and saw no activity that raised concern of inappropriate use.        Relevant Medications   traMADol (ULTRAM-ER) 100 MG 24 hr tablet   pregabalin (LYRICA) 150 MG capsule   Erectile dysfunction    New.  Patient declines further evaluation with urology, or even testosterone pulmonary clinic.  I congratulated him on smoking cessation which certainly can contribute.  Otherwise patient is of normal weight, no other comorbidities.  Very little alcohol use although I did advise this can interfere with sustained ability to have an erection. Suspect stress is contributory.  Patient will start low-dose Viagra with close vigilance of symptoms.  He will let me know how he is doing      Relevant Medications   sildenafil (VIAGRA) 50 MG tablet         I discussed the assessment and treatment plan with the patient. The patient was provided an opportunity to ask questions and all were answered. The patient agreed with the  plan and demonstrated an understanding of the instructions.   The patient was advised to call back or seek an in-person evaluation if the symptoms worsen or if the condition fails to improve as anticipated.   Rennie PlowmanMargaret Arnett, FNP

## 2018-05-25 ENCOUNTER — Encounter: Payer: Self-pay | Admitting: Family

## 2018-05-25 ENCOUNTER — Other Ambulatory Visit: Payer: Self-pay

## 2018-05-25 ENCOUNTER — Ambulatory Visit (INDEPENDENT_AMBULATORY_CARE_PROVIDER_SITE_OTHER): Payer: Managed Care, Other (non HMO) | Admitting: Family

## 2018-05-25 DIAGNOSIS — G8929 Other chronic pain: Secondary | ICD-10-CM | POA: Diagnosis not present

## 2018-05-25 DIAGNOSIS — N529 Male erectile dysfunction, unspecified: Secondary | ICD-10-CM | POA: Diagnosis not present

## 2018-05-25 DIAGNOSIS — M545 Low back pain, unspecified: Secondary | ICD-10-CM

## 2018-05-25 DIAGNOSIS — K219 Gastro-esophageal reflux disease without esophagitis: Secondary | ICD-10-CM | POA: Diagnosis not present

## 2018-05-25 MED ORDER — TRAMADOL HCL ER 100 MG PO TB24: 100.0000 mg | ORAL_TABLET | Freq: Every day | ORAL | 1 refills | Status: DC

## 2018-05-25 MED ORDER — SILDENAFIL CITRATE 50 MG PO TABS: 25.0000 mg | ORAL_TABLET | Freq: Every day | ORAL | 2 refills | Status: DC | PRN

## 2018-05-25 MED ORDER — PREGABALIN 150 MG PO CAPS: 150.0000 mg | ORAL_CAPSULE | Freq: Two times a day (BID) | ORAL | 1 refills | Status: DC

## 2018-05-25 NOTE — Assessment & Plan Note (Signed)
Chronic, unchanged.  Advised patient that he would prefer to take tramadol ER that seems reasonable.  I did ask that he takes tramadol 100 mg for 5 days as we are changing formulations.  After 5 days he may increased dose to 200 mg ER daily.  Next refill I will refill at 200 mg extended release.  We will continue Lyrica. He will let me know how he is doing  I looked up patient on [place] Controlled Substances Reporting System and saw no activity that raised concern of inappropriate use.

## 2018-05-25 NOTE — Assessment & Plan Note (Signed)
New.  Patient declines further evaluation with urology, or even testosterone pulmonary clinic.  I congratulated him on smoking cessation which certainly can contribute.  Otherwise patient is of normal weight, no other comorbidities.  Very little alcohol use although I did advise this can interfere with sustained ability to have an erection. Suspect stress is contributory.  Patient will start low-dose Viagra with close vigilance of symptoms.  He will let me know how he is doing

## 2018-05-25 NOTE — Patient Instructions (Addendum)
Take 100mg  ER tramadol daily for 5 days, then take 200mg  total daily.  Trial viagra. ( read literature below)  Let me know how you are doing on all.   Let me know if you would like consult with urology or further evaluation.    Sildenafil tablets (Erectile Dysfunction) What is this medicine? SILDENAFIL (sil DEN a fil) is used to treat erection problems in men. This medicine may be used for other purposes; ask your health care provider or pharmacist if you have questions. COMMON BRAND NAME(S): Viagra What should I tell my health care provider before I take this medicine? They need to know if you have any of these conditions: -bleeding disorders -eye or vision problems, including a rare inherited eye disease called retinitis pigmentosa -anatomical deformation of the penis, Peyronie's disease, or history of priapism (painful and prolonged erection) -heart disease, angina, a history of heart attack, irregular heart beats, or other heart problems -high or low blood pressure -history of blood diseases, like sickle cell anemia or leukemia -history of stomach bleeding -kidney disease -liver disease -stroke -an unusual or allergic reaction to sildenafil, other medicines, foods, dyes, or preservatives -pregnant or trying to get pregnant -breast-feeding How should I use this medicine? Take this medicine by mouth with a glass of water. Follow the directions on the prescription label. The dose is usually taken 1 hour before sexual activity. You should not take the dose more than once per day. Do not take your medicine more often than directed. Talk to your pediatrician regarding the use of this medicine in children. This medicine is not used in children for this condition. Overdosage: If you think you have taken too much of this medicine contact a poison control center or emergency room at once. NOTE: This medicine is only for you. Do not share this medicine with others. What if I miss a  dose? This does not apply. Do not take double or extra doses. What may interact with this medicine? Do not take this medicine with any of the following medications: -cisapride -nitrates like amyl nitrite, isosorbide dinitrate, isosorbide mononitrate, nitroglycerin -riociguat This medicine may also interact with the following medications: -antiviral medicines for HIV or AIDS -bosentan -certain medicines for benign prostatic hyperplasia (BPH) -certain medicines for blood pressure -certain medicines for fungal infections like ketoconazole and itraconazole -cimetidine -erythromycin -rifampin This list may not describe all possible interactions. Give your health care provider a list of all the medicines, herbs, non-prescription drugs, or dietary supplements you use. Also tell them if you smoke, drink alcohol, or use illegal drugs. Some items may interact with your medicine. What should I watch for while using this medicine? If you notice any changes in your vision while taking this drug, call your doctor or health care professional as soon as possible. Stop using this medicine and call your health care provider right away if you have a loss of sight in one or both eyes. Contact your doctor or health care professional right away if you have an erection that lasts longer than 4 hours or if it becomes painful. This may be a sign of a serious problem and must be treated right away to prevent permanent damage. If you experience symptoms of nausea, dizziness, chest pain or arm pain upon initiation of sexual activity after taking this medicine, you should refrain from further activity and call your doctor or health care professional as soon as possible. Do not drink alcohol to excess (examples, 5 glasses of wine or 5  shots of whiskey) when taking this medicine. When taken in excess, alcohol can increase your chances of getting a headache or getting dizzy, increasing your heart rate or lowering your blood  pressure. Using this medicine does not protect you or your partner against HIV infection (the virus that causes AIDS) or other sexually transmitted diseases. What side effects may I notice from receiving this medicine? Side effects that you should report to your doctor or health care professional as soon as possible: -allergic reactions like skin rash, itching or hives, swelling of the face, lips, or tongue -breathing problems -changes in hearing -changes in vision -chest pain -fast, irregular heartbeat -prolonged or painful erection -seizures Side effects that usually do not require medical attention (report to your doctor or health care professional if they continue or are bothersome): -back pain -dizziness -flushing -headache -indigestion -muscle aches -nausea -stuffy or runny nose This list may not describe all possible side effects. Call your doctor for medical advice about side effects. You may report side effects to FDA at 1-800-FDA-1088. Where should I keep my medicine? Keep out of reach of children. Store at room temperature between 15 and 30 degrees C (59 and 86 degrees F). Throw away any unused medicine after the expiration date. NOTE: This sheet is a summary. It may not cover all possible information. If you have questions about this medicine, talk to your doctor, pharmacist, or health care provider.  2019 Elsevier/Gold Standard (2014-12-03 12:00:25)

## 2018-05-25 NOTE — Assessment & Plan Note (Signed)
Improved with carafate. No symptoms today. He will continue to follow with GI. Declines any imaging of chest or abdomen today.

## 2018-06-03 ENCOUNTER — Encounter: Payer: Self-pay | Admitting: Family

## 2018-06-04 ENCOUNTER — Other Ambulatory Visit: Payer: Self-pay | Admitting: Family

## 2018-06-04 DIAGNOSIS — M545 Low back pain, unspecified: Secondary | ICD-10-CM

## 2018-06-04 DIAGNOSIS — G8929 Other chronic pain: Secondary | ICD-10-CM

## 2018-06-04 MED ORDER — TRAMADOL HCL ER 200 MG PO TB24: 200.0000 mg | ORAL_TABLET | Freq: Every day | ORAL | 0 refills | Status: DC

## 2018-06-04 NOTE — Progress Notes (Signed)
I looked up patient on [place] Controlled Substances Reporting System and saw no activity that raised concern of inappropriate use.   

## 2018-06-08 ENCOUNTER — Ambulatory Visit: Payer: Managed Care, Other (non HMO) | Admitting: Family

## 2018-06-19 ENCOUNTER — Other Ambulatory Visit: Payer: Self-pay

## 2018-06-20 ENCOUNTER — Encounter: Payer: Self-pay | Admitting: Family

## 2018-06-20 ENCOUNTER — Ambulatory Visit (INDEPENDENT_AMBULATORY_CARE_PROVIDER_SITE_OTHER): Payer: Managed Care, Other (non HMO) | Admitting: Family

## 2018-06-20 DIAGNOSIS — M545 Low back pain, unspecified: Secondary | ICD-10-CM

## 2018-06-20 DIAGNOSIS — K219 Gastro-esophageal reflux disease without esophagitis: Secondary | ICD-10-CM | POA: Diagnosis not present

## 2018-06-20 DIAGNOSIS — G8929 Other chronic pain: Secondary | ICD-10-CM

## 2018-06-20 MED ORDER — TRAMADOL HCL 100 MG PO TABS: 1.0000 | ORAL_TABLET | Freq: Two times a day (BID) | ORAL | 1 refills | Status: DC

## 2018-06-20 MED ORDER — PREGABALIN 150 MG PO CAPS: 150.0000 mg | ORAL_CAPSULE | Freq: Three times a day (TID) | ORAL | 1 refills | Status: DC

## 2018-06-20 NOTE — Patient Instructions (Addendum)
STOP tramadol ER and safely dispose of medication  Start Tramadol 100mg  twice per day  INcrease lyrica to 150mg  three times per day  Continue voltaren gel  Use tylenol   Today we discussed referrals, orders. Physical therapy   I have placed these orders in the system for you.  Please be sure to give [place] a call if you have not heard from our office regarding this. We should hear from [place] within ONE week with information regarding your appointment. If not, please let me know immediately.    Follow up in one month.

## 2018-06-20 NOTE — Assessment & Plan Note (Signed)
Improved. Will follow 

## 2018-06-20 NOTE — Progress Notes (Signed)
This visit type was conducted due to national recommendations for restrictions regarding the COVID-19 pandemic (e.g. social distancing).  This format is felt to be most appropriate for this patient at this time.  All issues noted in this document were discussed and addressed.  No physical exam was performed (except for noted visual exam findings with Video Visits). Virtual Visit via Video Note  I connected with@  on 06/20/18 at 11:30 AM EDT by a video enabled telemedicine application and verified that I am speaking with the correct person using two identifiers.  Location patient: home Location provider:work  Persons participating in the virtual visit: patient, provider  I discussed the limitations of evaluation and management by telemedicine and the availability of in person appointments. The patient expressed understanding and agreed to proceed.  This visit type was conducted due to national recommendations for restrictions regarding the COVID-19 pandemic (e.g. social distancing).  This format is felt to be most appropriate for this patient at this time.  All issues noted in this document were discussed and addressed.  No physical exam was performed (except for noted visual exam findings with Video Visits).     HPI:  Low back pain-  Waxes and wanes. Thinks rain contributes, of late thinks having a flare. Tramadol ER not working during the night. Pain controlled during the day. Would like to go back to tramadol 100mg  BID. Voltaren gel with relief. Doing  Home exercises at home with relief. Would also like to return to prior dose of lyrica 150mg  TID.   GERD- 'doing great'. Controlling with diet modifications, avoiding tomato sauce. no recent flares; plans to wean off nexium.   ROS: See pertinent positives and negatives per HPI.  Past Medical History:  Diagnosis Date  . Arthritis   . Hypertension     History reviewed. No pertinent surgical history.  Family History  Problem Relation Age  of Onset  . Hyperlipidemia Mother   . Hyperlipidemia Father   . Hearing loss Father   . Diabetes Father   . Arthritis Father     SOCIAL HX: former smoker   Current Outpatient Medications:  .  esomeprazole (NEXIUM) 40 MG capsule, Take 1 capsule (40 mg total) by mouth daily. (Patient taking differently: Take 40 mg by mouth 2 (two) times daily before a meal. Take two tablets by mouth before breakfast & two tablets before dinner.), Disp: 90 capsule, Rfl: 0 .  pregabalin (LYRICA) 150 MG capsule, Take 1 capsule (150 mg total) by mouth 3 (three) times daily., Disp: 90 capsule, Rfl: 1 .  sildenafil (VIAGRA) 50 MG tablet, Take 0.5 tablets (25 mg total) by mouth daily as needed for erectile dysfunction (take 0.5-4h before sexual activity)., Disp: 10 tablet, Rfl: 2 .  sucralfate (CARAFATE) 1 g tablet, Take 1 tablet by mouth 2 (two) times daily., Disp: , Rfl:  .  traMADol HCl 100 MG TABS, Take 1 tablet by mouth 2 (two) times a day., Disp: 60 tablet, Rfl: 1   ASSESSMENT AND PLAN:  Discussed the following assessment and plan:   Problem List Items Addressed This Visit      Digestive   Gastroesophageal reflux disease    Improved. 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 We will return to tramadol 100mg  BID and lyrica 150mg  TID.  Strongly encouraged consult pain management, patient politely declines.  Advised that I would not go up  on Lyrica or tramadol at this point as I do not routinely manage chronic pain.  He verbalized understanding of this. Declines XR, MRI lumbar spine.  We discussed physical therapy, patient very willing to do this and referral placed today.  Close follow-up. I looked up patient on [place] Controlled Substances Reporting System and saw no activity that raised concern of inappropriate use.        Relevant Medications   traMADol HCl 100 MG TABS   pregabalin (LYRICA) 150 MG capsule   Other Relevant Orders    Ambulatory referral to Physical Therapy         I discussed the assessment and treatment plan with the patient. The patient was provided an opportunity to ask questions and all were answered. The patient agreed with the plan and demonstrated an understanding of the instructions.   The patient was advised to call back or seek an in-person evaluation if the symptoms worsen or if the condition fails to improve as anticipated.   Rennie PlowmanMargaret , FNP

## 2018-06-20 NOTE — Assessment & Plan Note (Addendum)
Waxes and wanes. We will return to tramadol 100mg  BID and lyrica 150mg  TID.  Strongly encouraged consult pain management, patient politely declines.  Advised that I would not go up on Lyrica or tramadol at this point as I do not routinely manage chronic pain.  He verbalized understanding of this. Declines XR, MRI lumbar spine.  We discussed physical therapy, patient very willing to do this and referral placed today.  Close follow-up. I looked up patient on [place] Controlled Substances Reporting System and saw no activity that raised concern of inappropriate use.

## 2018-06-21 ENCOUNTER — Telehealth: Payer: Self-pay | Admitting: *Deleted

## 2018-06-21 NOTE — Telephone Encounter (Signed)
I have called and spoke with [facility]. PA was only needed on tramadol ER, which patient was taken off of. The plain tramadol Hcl did not requite PA.

## 2018-06-21 NOTE — Telephone Encounter (Signed)
Copied from [place] [phone]. Topic: General - Other >> Jun 21, 2018 12:21 PM Leward Quan A wrote: Reason for CRM: [facility] called to request additional information for Prior auth for traMADol HCl 100 MG TABS. Ryan Ph# [phone] Ref# [id number]

## 2018-06-25 ENCOUNTER — Other Ambulatory Visit: Payer: Self-pay | Admitting: Family

## 2018-06-25 ENCOUNTER — Encounter: Payer: Self-pay | Admitting: Family

## 2018-06-25 DIAGNOSIS — G8929 Other chronic pain: Secondary | ICD-10-CM

## 2018-06-25 DIAGNOSIS — M545 Low back pain, unspecified: Secondary | ICD-10-CM

## 2018-06-26 ENCOUNTER — Telehealth: Payer: Self-pay

## 2018-06-26 NOTE — Telephone Encounter (Signed)
Advise patient I'm out of the office - he will have to wait until tomorrow

## 2018-06-27 NOTE — Telephone Encounter (Signed)
done

## 2018-06-27 NOTE — Telephone Encounter (Signed)
Patient notified

## 2018-06-27 NOTE — Telephone Encounter (Signed)
Let pt know  rx sent     He did not pick up the tramadol 100mg  tablets as too expensive  I looked up patient on [place] Controlled Substances Reporting System and saw no activity that raised concern of inappropriate use.

## 2018-07-25 ENCOUNTER — Encounter: Payer: Self-pay | Admitting: Family

## 2018-07-26 NOTE — Telephone Encounter (Signed)
Patient has been having [place] on right side of chest some pain under breast area. Patient says going up flights up steps he gets short of breath.For the past week , hurting in lower back on same side. Tried to schedule with NP in office patient refused and wants to see PCP. Advised patient if symptoms worsen he needs to be seen immediately at [facility] or ER.

## 2018-07-27 ENCOUNTER — Encounter: Payer: Self-pay | Admitting: Family

## 2018-07-27 ENCOUNTER — Other Ambulatory Visit: Payer: Self-pay

## 2018-07-27 ENCOUNTER — Ambulatory Visit (INDEPENDENT_AMBULATORY_CARE_PROVIDER_SITE_OTHER): Payer: Managed Care, Other (non HMO) | Admitting: Family

## 2018-07-27 DIAGNOSIS — K219 Gastro-esophageal reflux disease without esophagitis: Secondary | ICD-10-CM

## 2018-07-27 DIAGNOSIS — R079 Chest pain, unspecified: Secondary | ICD-10-CM | POA: Diagnosis not present

## 2018-07-27 DIAGNOSIS — M545 Low back pain, unspecified: Secondary | ICD-10-CM

## 2018-07-27 DIAGNOSIS — G8929 Other chronic pain: Secondary | ICD-10-CM

## 2018-07-27 DIAGNOSIS — R300 Dysuria: Secondary | ICD-10-CM | POA: Insufficient documentation

## 2018-07-27 LAB — URINALYSIS, ROUTINE W REFLEX MICROSCOPIC
Bilirubin Urine: NEGATIVE
Ketones, ur: NEGATIVE
Leukocytes,Ua: NEGATIVE
Nitrite: NEGATIVE
Specific Gravity, Urine: 1.02 (ref 1.000–1.030)
Total Protein, Urine: NEGATIVE
Urine Glucose: NEGATIVE
Urobilinogen, UA: 0.2 (ref 0.0–1.0)
pH: 6 (ref 5.0–8.0)

## 2018-07-27 MED ORDER — CYCLOBENZAPRINE HCL 5 MG PO TABS: 5.0000 mg | ORAL_TABLET | Freq: Every evening | ORAL | 1 refills | Status: DC | PRN

## 2018-07-27 NOTE — Addendum Note (Signed)
Addended by: Leeanne Rio on: 07/27/2018 11:53 AM   Modules accepted: Orders

## 2018-07-27 NOTE — Assessment & Plan Note (Signed)
Patient well-appearing over the video today.  No acute respiratory distress.  No shortness of breath or pain with inspiration.  His pain is nonspecific at this time, jointly agreed to proceed with CT angiogram to rule out pulmonary aneurysm, pulmonary effusion would be appropriate.  I ordered stat.  Alternatively considering gallbladder etiology.  Close follow up.

## 2018-07-27 NOTE — Assessment & Plan Note (Signed)
Pending urine studies. 

## 2018-07-27 NOTE — Telephone Encounter (Signed)
Patient would like work note for today and tomorrow , due to pain - send via mychart.

## 2018-07-27 NOTE — Assessment & Plan Note (Signed)
History of chronic low back pain however this pain feels different than prior to patient.  No recent images aside from an x-ray in 2018 which was unrevealing.  We both agree we pursue MRI lumbar, sacrum.  Continue current medication regimen.

## 2018-07-27 NOTE — Progress Notes (Signed)
This visit type was conducted due to national recommendations for restrictions regarding the COVID-19 pandemic (e.g. social distancing).  This format is felt to be most appropriate for this patient at this time.  All issues noted in this document were discussed and addressed.  No physical exam was performed (except for noted visual exam findings with Video Visits). Virtual Visit via Video Note  I connected with@  on 07/27/18 at 10:00 AM EDT by a video enabled telemedicine application and verified that I am speaking with the correct person using two identifiers.  Location patient: home Location provider:work  Persons participating in the virtual visit: patient, provider  I discussed the limitations of evaluation and management by telemedicine and the availability of in person appointments. The patient expressed understanding and agreed to proceed.  Interactive audio and video telecommunications were attempted between this provider and patient, however failed, due to patient having technical difficulties or patient did not have access to video capability.  We continued and completed visit with audio only.    HPI:  Multiple complaints today  Complains of right sided 'under breast' , thinks from picking up something at home. Felt like 'pulled muscle' . Got better at that time.  Now to the left side as well. Tender when  Presses on rib cage. No tenderness today. No rash. No injury.   Notes he has been drinking soda's ' a ton'  In which urine had been dark at that time,  and now drinking one per day. Endorses dysuria for couple of weeks. NO hematuria, penile discharge, flank pain, N, vomiting, abdominal pain.  No concern for STDs.   GERD- on nexium, pepcid , carafate; following with Dr Tobi BastosAnna. When has 'bad reflux' has choking feeling with regurgitation which would resolve with Pepcid AC.  No SOB, CP, left arm pain. NO pain with deep breath.   Continues to use NSAIDs, ibuprofen, BC powder however  plans today to stop ibuprofen/BC .   Chronic back pain- low center of back which is different from 'regular back pain.' Waxes and wanes. Improved with drinking 'more water.' No relief from tramadol 100mg  BID, lyrica. No numbness in legs, saddle anesthesia.   Thinks Anxiety is contributory to all.  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  Never had stress test or echocardiogram with cardiology.   ROS: See pertinent positives and negatives per HPI.  Past Medical History:  Diagnosis Date  . Arthritis   . Hypertension     History reviewed. No pertinent surgical history.  Family History  Problem Relation Age of Onset  . Hyperlipidemia Mother   . Hyperlipidemia Father   . Hearing loss Father   . Diabetes Father   . Arthritis Father     SOCIAL HX: former smoker   Current Outpatient Medications:  .  esomeprazole (NEXIUM) 40 MG capsule, Take 1 capsule (40 mg total) by mouth daily. (Patient taking differently: Take 40 mg by mouth 2 (two) times daily before a meal. Take two tablets by mouth before breakfast & two tablets before dinner.), Disp: 90 capsule, Rfl: 0 .  famotidine (PEPCID) 20 MG tablet, Take 20 mg by mouth 2 (two) times daily., Disp: , Rfl:  .  pregabalin (LYRICA) 150 MG capsule, Take 1 capsule (150 mg total) by mouth 3 (three) times daily., Disp: 90 capsule, Rfl: 1 .  sildenafil (VIAGRA) 50 MG tablet, Take 0.5 tablets (25 mg total) by mouth daily as needed for erectile dysfunction (take 0.5-4h before sexual activity)., Disp: 10 tablet, Rfl:  2 .  sucralfate (CARAFATE) 1 g tablet, Take 1 tablet by mouth 4 (four) times daily -  with meals and at bedtime. , Disp: , Rfl:  .  traMADol (ULTRAM) 50 MG tablet, TAKE TWO TABLETS BY MOUTH EVERY 12 HOURS AS NEEDED FOR PAIN, Disp: 120 tablet, Rfl: 1 .  cyclobenzaprine (FLEXERIL) 5 MG tablet, Take 1 tablet (5 mg total) by mouth at bedtime as needed for muscle spasms., Disp: 30 tablet, Rfl: 1  EXAM:  VITALS per patient if  applicable:  GENERAL: alert, oriented, appears well and in no acute distress  HEENT: atraumatic, conjunttiva clear, no obvious abnormalities on inspection of external nose and ears  NECK: normal movements of the head and neck  LUNGS: on inspection no signs of respiratory distress, breathing rate appears normal, no obvious gross SOB, gasping or wheezing  CV: no obvious cyanosis  MS: moves all visible extremities without noticeable abnormality  PSYCH/NEURO: pleasant and cooperative, no obvious depression or anxiety, speech and thought processing grossly intact  ASSESSMENT AND PLAN:  Discussed the following assessment and plan:  Problem List Items Addressed This Visit      Digestive   Gastroesophageal reflux disease    Refractory. Waxes and wanes.  Advised patient he should maintain close follow-up with GI and he states that he has called and left message with GI today.  Strongly emphasized the importance of avoiding all NSAIDs particularly the concern of gastritis, PUD.  Patient understands .  Advised we will trial voltaren gel which is topical. Will follow      Relevant Medications   famotidine (PEPCID) 20 MG tablet     Other   Chronic midline low back pain without sciatica    History of chronic low back pain however this pain feels different than prior to patient.  No recent images aside from an x-ray in 2018 which was unrevealing.  We both agree we pursue MRI lumbar, sacrum.  Continue current medication regimen.       Relevant Medications   cyclobenzaprine (FLEXERIL) 5 MG tablet   Other Relevant Orders   MR Lumbar Spine Wo Contrast   MR SACRUM SI JOINTS W WO CONTRAST   Dysuria - Primary    Pending urine studies      Relevant Orders   Urinalysis, Routine w reflex microscopic   Urine Culture   Right-sided chest pain    Patient well-appearing over the video today.  No acute respiratory distress.  No shortness of breath or pain with inspiration.  His pain is nonspecific  at this time, jointly agreed to proceed with CT angiogram to rule out pulmonary aneurysm, pulmonary effusion would be appropriate.  I ordered stat.  Alternatively considering gallbladder etiology.  Close follow up.        Other Visit Diagnoses    Chest pain, unspecified type       Relevant Orders   CT Angio Chest W/Cm &/Or Wo Cm        I discussed the assessment and treatment plan with the patient. The patient was provided an opportunity to ask questions and all were answered. The patient agreed with the plan and demonstrated an understanding of the instructions.   The patient was advised to call back or seek an in-person evaluation if the symptoms worsen or if the condition fails to improve as anticipated.   Mable Paris, FNP

## 2018-07-27 NOTE — Telephone Encounter (Signed)
Pt called back to follow up from message.

## 2018-07-27 NOTE — Assessment & Plan Note (Signed)
Refractory. Waxes and wanes.  Advised patient he should maintain close follow-up with GI and he states that he has called and left message with GI today.  Strongly emphasized the importance of avoiding all NSAIDs particularly the concern of gastritis, PUD.  Patient understands .  Advised we will trial voltaren gel which is topical. Will follow

## 2018-07-27 NOTE — Patient Instructions (Addendum)
Stop ALL anti inflammatories- ibuprofen of BC powder  May trial Voltaren gel.  Heat on sides of ribs.   May trial flexeril ; do not take Lyrica and Flexeril at the same time. Be very cautious with this.   Do not drive or operate heavy machinery while on muscle relaxant. Please do not drink alcohol. Only take this medication as needed for acute muscle spasm at bedtime. This medication make you feel drowsy so be very careful.  Stop taking if become too drowsy or somnolent as this puts you at risk for falls. Please contact our office with any questions.   CT Angio today  Today we discussed referrals, orders. Urine, MRI   I have placed these orders in the system for you.  Please be sure to give [place] a call if you have not heard from our office regarding this. We should hear from [place] within ONE week with information regarding your appointment. If not, please let me know immediately.   Please come to office for urine studies.  Close follow up - call to schedule a follow up in one to two weeks, sooner if needed.

## 2018-07-29 ENCOUNTER — Other Ambulatory Visit: Payer: Self-pay | Admitting: Family

## 2018-07-29 DIAGNOSIS — N529 Male erectile dysfunction, unspecified: Secondary | ICD-10-CM

## 2018-07-29 LAB — URINE CULTURE
MICRO NUMBER:: 701401
Result:: NO GROWTH
SPECIMEN QUALITY:: ADEQUATE

## 2018-07-30 ENCOUNTER — Encounter: Payer: Self-pay | Admitting: Family

## 2018-07-30 ENCOUNTER — Other Ambulatory Visit: Payer: Self-pay | Admitting: Family

## 2018-07-30 DIAGNOSIS — R319 Hematuria, unspecified: Secondary | ICD-10-CM

## 2018-07-30 DIAGNOSIS — R300 Dysuria: Secondary | ICD-10-CM

## 2018-07-30 DIAGNOSIS — R079 Chest pain, unspecified: Secondary | ICD-10-CM

## 2018-07-30 NOTE — Progress Notes (Signed)
cxr 

## 2018-07-31 ENCOUNTER — Telehealth: Payer: Self-pay

## 2018-07-31 NOTE — Telephone Encounter (Signed)
I spoke with patient & he is having no new symptoms. He has actually felt better the last two days. He only is having the dull pain under right breast area. He still wants to do CT renal, chest xray, but is most concerned about CT angio. I advised we were still working on this & were waiting on insurance. He did not want to do chest xray or CT renal today due to him being out of town closing on his [place]. I have spoken with Melissa & asked that if this was approved to possibly schedule it for tomorrow.  Patient was advised if any further or worsening chest/back pain to please go to ED. I reminded him that they could actually have these tests done much faster. Patient verbalized understanding.

## 2018-07-31 NOTE — Telephone Encounter (Signed)
-----   Message from Burnard Hawthorne, [place] sent at 07/31/2018  7:12 AM EDT ----- Regarding: Checking In Sarah,   I spoke with [facility] yesterday and she is working with patients insurance to get perhaps Chest X-ray and CT Renal done at [facility] today . Looks like it could be a couple more days before CT Angio Chest is even approved( if it is). Would you call patient and triage for pain and new symptoms ? He may need to go to ED where these tests would not be delayed. I do not want to delay medical care because of his insurance and know Lenna Sciara is doing all she can. Please advise him ED if pain is severe or new symptoms . It has been several days and I am uncomfortable with waiting if patient is in so much pain.   Melissa, Question for you - since I had nearly completed that list from insurance to have CT Angio done, I'm missing the pulmonary piece with PFTs. Do I need to place a referral to pulmonology if patient prefers to wait on tests as outpatient ( versus ED)?   Y'all let me know and certainly want y'all to be in touch so patient knows what is going on.  At home today, but call me with any concerns.  Margaret  [phone]

## 2018-08-01 ENCOUNTER — Other Ambulatory Visit: Payer: Self-pay

## 2018-08-01 ENCOUNTER — Ambulatory Visit (INDEPENDENT_AMBULATORY_CARE_PROVIDER_SITE_OTHER): Payer: Managed Care, Other (non HMO) | Admitting: Family

## 2018-08-01 ENCOUNTER — Encounter: Payer: Self-pay | Admitting: Family

## 2018-08-01 VITALS — BP 126/78 | HR 91 | Temp 98.3°F

## 2018-08-01 DIAGNOSIS — G8929 Other chronic pain: Secondary | ICD-10-CM

## 2018-08-01 DIAGNOSIS — R1013 Epigastric pain: Secondary | ICD-10-CM

## 2018-08-01 DIAGNOSIS — K219 Gastro-esophageal reflux disease without esophagitis: Secondary | ICD-10-CM

## 2018-08-01 DIAGNOSIS — M545 Low back pain, unspecified: Secondary | ICD-10-CM

## 2018-08-01 DIAGNOSIS — R319 Hematuria, unspecified: Secondary | ICD-10-CM

## 2018-08-01 DIAGNOSIS — R079 Chest pain, unspecified: Secondary | ICD-10-CM | POA: Diagnosis not present

## 2018-08-01 NOTE — Telephone Encounter (Signed)
Noted He has OV today

## 2018-08-01 NOTE — Progress Notes (Signed)
Subjective:    Patient ID: Philip Stephens, male    DOB: 11/02/1975, 43 y.o.   MRN: [id number]  CC: Philip Stephens is a 43 y.o. male who presents today for follow up.   HPI: Feels well today, no new complaints.  He was seen by GI earlier today and was started on Dexilant. He continues take Pepcid.  He does state he has been taking ibuprofen and Goody's powder still.  He plans to stop these medications.    He describes episodes of epigastric pain he feels like he can "predict" coming on. No epigastric or chest pain today. Overall improved.  He describes a typical episode as feeling a 'burp is coming on' and suddenly burning in the center of the type chest sometimes with regurgitation.  No vomiting. Overall these episodes have decreased starting prevacid/ pepcid.    He does feel he can press on his upper chest which will cause pain.  He has also noted that when he is 'more anxious' he feels epigastric pain as well.  His wife thinks anxiety is very much contributory.  episodes occur when still or with movement.  They resolve with burping.  Pain has improved with the Prevacid, Pepcid and dietary changes.  Denies exertional chest pain or pressure, numbness or tingling radiating to left arm or jaw, palpitations, dizziness, frequent headaches, changes in vision, or shortness of breath.  He denies any dysuria, hematuria, urinary frequency, flank pain.   He states his low back pain is the best it has been these past  Couple of days.  He feels like he is back to his 'regular, chronic back pain.'  He denies any numbness in his legs or groin.    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      HISTORY:  Past Medical History:  Diagnosis Date  . Arthritis   . Hypertension    History reviewed. No pertinent surgical history. Family History  Problem Relation Age of Onset  . Hyperlipidemia Mother   . Hyperlipidemia Father   .  Hearing loss Father   . Diabetes Father   . Arthritis Father     Allergies: Penicillins Current Outpatient Medications on File Prior to Visit  Medication Sig Dispense Refill  . cyclobenzaprine (FLEXERIL) 5 MG tablet Take 1 tablet (5 mg total) by mouth at bedtime as needed for muscle spasms. 30 tablet 1  . dexlansoprazole (DEXILANT) 60 MG capsule Take 1 capsule by mouth daily.    Marland Kitchen. esomeprazole (NEXIUM) 40 MG capsule Take 1 capsule (40 mg total) by mouth daily. (Patient taking differently: Take 40 mg by mouth 2 (two) times daily before a meal. Take two tablets by mouth before breakfast & two tablets before dinner.) 90 capsule 0  . famotidine (PEPCID) 20 MG tablet Take 20 mg by mouth 2 (two) times daily.    . sildenafil (VIAGRA) 50 MG tablet TAKE 1/2 TABLET BY MOUTH DAILY AS NEEDED FOR ERECTILE DYSFUNCTION (TAKE 30 MINUTES TO 4 HOURS BEFORE SEXUAL ACTIVITY) 10 tablet 1  . sucralfate (CARAFATE) 1 g tablet Take 1 tablet by mouth 4 (four) times daily -  with meals and at bedtime.     . traMADol (ULTRAM) 50 MG tablet TAKE TWO TABLETS BY MOUTH EVERY 12 HOURS AS NEEDED FOR PAIN 120 tablet 1   No current facility-administered medications on file prior to visit.     Social History   Tobacco Use  .  Smoking status: Former Smoker    Quit date: 02/03/2018    Years since quitting: 0.5  . Smokeless tobacco: Former NeurosurgeonUser  . Tobacco comment: Does Velo. ( has nicotine , no tobacco).   Substance Use Topics  . Alcohol use: Not Currently  . Drug use: Never    Review of Systems  Constitutional: Negative for chills and fever.  Respiratory: Negative for cough.   Cardiovascular: Positive for chest pain (epigastric). Negative for palpitations.  Gastrointestinal: Negative for nausea and vomiting.  Genitourinary: Negative for decreased urine volume, discharge, dysuria, hematuria and urgency.  Musculoskeletal: Positive for back pain.  Psychiatric/Behavioral: Negative for sleep disturbance and suicidal ideas.  The patient is nervous/anxious.       Objective:    BP 126/78 (BP Location: Left Arm, Patient Position: Sitting, Cuff Size: Large)   Pulse 91   Temp 98.3 F (36.8 C)   SpO2 98%  BP Readings from Last 3 Encounters:  08/01/18 126/78  03/14/18 108/70  02/16/18 122/76   Wt Readings from Last 3 Encounters:  03/14/18 153 lb 6.4 oz (69.6 kg)  02/16/18 160 lb 9.6 oz (72.8 kg)  02/02/18 161 lb 3.2 oz (73.1 kg)    Physical Exam Vitals signs reviewed.  Constitutional:      Appearance: He is well-developed.  Cardiovascular:     Rate and Rhythm: Regular rhythm.     Heart sounds: Normal heart sounds.  Pulmonary:     Effort: Pulmonary effort is normal. No respiratory distress.     Breath sounds: Normal breath sounds. No wheezing, rhonchi or rales.  Skin:    General: Skin is warm and dry.  Neurological:     Mental Status: He is alert.  Psychiatric:        Speech: Speech normal.        Behavior: Behavior normal.        Assessment & Plan:   Problem List Items Addressed This Visit      Digestive   Gastroesophageal reflux disease    Refractory. I suspect constellation of symptoms are likely related to GERD, peptic ulcer disease Again, advised NO NSAIDS. He will likely pursue EGD once he has seen cardiology.  Following with GI and will follow.       Relevant Medications   dexlansoprazole (DEXILANT) 60 MG capsule     Other   Chronic midline low back pain without sciatica   Relevant Orders   DG Lumbar Spine Complete (Completed)   DG Sacrum/Coccyx (Completed)   Epigastric pain    Atypical for cardiac chest pain.  Typically starts with a "burp".  Relieved with PPI, burping.  Also appears reproducible. EKG today shows sinus rhythm, heart rate 79 no acute ischemia.  When compared to prior EKG November 2019, no significant changes.  However with family history and patient's former history of smoking as well as recurrent epigastric chest pain, I think extremely appropriate for him  to see cardiology ( second opinion with [facility]) at this time.  I placed a referral ; also particularly as [facility] would like cardiology consult prior to EGD as well. Will follow.        Other Visit Diagnoses    Hematuria, unspecified type    -  Primary   Relevant Orders   Urinalysis, Routine w reflex microscopic (Completed)   Chest pain, unspecified type       Relevant Orders   EKG 12-Lead (Completed)   Ambulatory referral to Cardiology   DG Chest 2 View (Completed)  I am having Gladys C. Grzywacz maintain his esomeprazole, sucralfate, traMADol, famotidine, cyclobenzaprine, sildenafil, and dexlansoprazole.   No orders of the defined types were placed in this encounter.   Return precautions given.   Risks, benefits, and alternatives of the medications and treatment plan prescribed today were discussed, and patient expressed understanding.   Education regarding symptom management and diagnosis given to patient on AVS.  Continue to follow with Burnard Hawthorne, FNP for routine health maintenance.   [name] and I agreed with plan.   Mable Paris, FNP

## 2018-08-01 NOTE — Patient Instructions (Addendum)
STOP NEXIUM is likely what Thayer Headings meant to advise today. Dexilant is in the same drug class so you would NOT Need both.   Schedule xrays, urine for tomorrow  Today we discussed referrals, orders. cardiology   I have placed these orders in the system for you.  Please be sure to give [place] a call if you have not heard from our office regarding this. We should hear from [place] within ONE week with information regarding your appointment. If not, please let me know immediately.   STOP GOODYs POWDER   Please continue to follow with GI  Let me know how your pain is and certainly if ever concern of sudden cardiac pain, please dont hesitate to go to emergency room. Better to be safe than sorry.

## 2018-08-02 ENCOUNTER — Ambulatory Visit (INDEPENDENT_AMBULATORY_CARE_PROVIDER_SITE_OTHER): Payer: Managed Care, Other (non HMO)

## 2018-08-02 DIAGNOSIS — G8929 Other chronic pain: Secondary | ICD-10-CM

## 2018-08-02 DIAGNOSIS — M545 Low back pain, unspecified: Secondary | ICD-10-CM

## 2018-08-02 DIAGNOSIS — R079 Chest pain, unspecified: Secondary | ICD-10-CM

## 2018-08-02 LAB — URINALYSIS, ROUTINE W REFLEX MICROSCOPIC
Bilirubin Urine: NEGATIVE
Ketones, ur: NEGATIVE
Leukocytes,Ua: NEGATIVE
Nitrite: NEGATIVE
Specific Gravity, Urine: 1.015 (ref 1.000–1.030)
Total Protein, Urine: NEGATIVE
Urine Glucose: NEGATIVE
Urobilinogen, UA: 0.2 (ref 0.0–1.0)
pH: 5.5 (ref 5.0–8.0)

## 2018-08-03 ENCOUNTER — Encounter: Payer: Self-pay | Admitting: Family

## 2018-08-03 DIAGNOSIS — R1013 Epigastric pain: Secondary | ICD-10-CM | POA: Insufficient documentation

## 2018-08-03 NOTE — Assessment & Plan Note (Addendum)
Atypical for cardiac chest pain.  Typically starts with a "burp".  Relieved with PPI, burping.  Also appears reproducible. EKG today shows sinus rhythm, heart rate 79 no acute ischemia.  When compared to prior EKG November 2019, no significant changes.  However with family history and patient's former history of smoking as well as recurrent epigastric chest pain, I think extremely appropriate for him to see cardiology ( second opinion with [facility]) at this time.  I placed a referral ; also particularly as [facility] would like cardiology consult prior to EGD as well. Will follow.

## 2018-08-03 NOTE — Assessment & Plan Note (Signed)
Refractory. I suspect constellation of symptoms are likely related to GERD, peptic ulcer disease Again, advised NO NSAIDS. He will likely pursue EGD once he has seen cardiology.  Following with GI and will follow.

## 2018-08-06 ENCOUNTER — Ambulatory Visit: Payer: Managed Care, Other (non HMO)

## 2018-08-06 ENCOUNTER — Encounter: Payer: Self-pay | Admitting: Family

## 2018-08-06 ENCOUNTER — Telehealth: Payer: Self-pay | Admitting: Family

## 2018-08-06 NOTE — Telephone Encounter (Signed)
LMTCB if needed. I read note by Joycelyn Schmid over VM & left up to him rather he wanted to go ahead in having done.

## 2018-08-06 NOTE — Telephone Encounter (Signed)
Call pt  He is having MRI lumbar tomorrow With his h/o of chronic pain , I would recommend pursuing. His lumbar Xray showed slight disc narrowing.   However if he symptomatic feels well and want to defer, we can continue to watch and order at a later date

## 2018-08-06 NOTE — Telephone Encounter (Signed)
noted 

## 2018-08-07 ENCOUNTER — Other Ambulatory Visit: Payer: Self-pay

## 2018-08-07 ENCOUNTER — Ambulatory Visit
Admission: RE | Admit: 2018-08-07 | Discharge: 2018-08-07 | Disposition: A | Discharge status: Home / Self Care | Payer: Managed Care, Other (non HMO) | Source: Ambulatory Visit | Attending: Family | Admitting: Family

## 2018-08-07 DIAGNOSIS — G8929 Other chronic pain: Secondary | ICD-10-CM | POA: Insufficient documentation

## 2018-08-07 DIAGNOSIS — M545 Low back pain, unspecified: Secondary | ICD-10-CM

## 2018-08-08 ENCOUNTER — Other Ambulatory Visit: Payer: Self-pay | Admitting: Family

## 2018-08-08 DIAGNOSIS — G8929 Other chronic pain: Secondary | ICD-10-CM

## 2018-08-08 DIAGNOSIS — M545 Low back pain, unspecified: Secondary | ICD-10-CM

## 2018-08-10 ENCOUNTER — Other Ambulatory Visit: Payer: Managed Care, Other (non HMO)

## 2018-08-10 NOTE — Telephone Encounter (Signed)
I looked up patient on [place] Controlled Substances Reporting System and saw no activity that raised concern of inappropriate use.   

## 2018-08-14 ENCOUNTER — Encounter: Payer: Self-pay | Admitting: Family

## 2018-08-15 ENCOUNTER — Telehealth: Payer: Self-pay

## 2018-08-15 ENCOUNTER — Encounter: Payer: Self-pay | Admitting: Family

## 2018-08-15 NOTE — Telephone Encounter (Signed)
I called patient in regards to [employer]. I asked that he call back our office.

## 2018-08-17 ENCOUNTER — Other Ambulatory Visit: Payer: Self-pay | Admitting: Family

## 2018-08-17 ENCOUNTER — Encounter: Payer: Self-pay | Admitting: Family

## 2018-08-17 DIAGNOSIS — G8929 Other chronic pain: Secondary | ICD-10-CM

## 2018-08-17 DIAGNOSIS — M545 Low back pain, unspecified: Secondary | ICD-10-CM

## 2018-08-17 NOTE — Telephone Encounter (Signed)
NO tsh since 3/19 please advise refill.

## 2018-08-17 NOTE — Telephone Encounter (Signed)
Last OV 07/27/18 last , last fill 07/22/18

## 2018-08-17 NOTE — Telephone Encounter (Signed)
[place]; obliged and refilled early he could take with him  I looked up patient on [place] Controlled Substances Reporting System and saw no activity that raised concern of inappropriate use.

## 2018-09-06 ENCOUNTER — Encounter: Payer: Self-pay | Admitting: Family Medicine

## 2018-09-06 ENCOUNTER — Ambulatory Visit (INDEPENDENT_AMBULATORY_CARE_PROVIDER_SITE_OTHER): Payer: Managed Care, Other (non HMO) | Admitting: Family Medicine

## 2018-09-06 VITALS — Ht 74.0 in | Wt 153.0 lb

## 2018-09-06 DIAGNOSIS — K219 Gastro-esophageal reflux disease without esophagitis: Secondary | ICD-10-CM

## 2018-09-06 DIAGNOSIS — R1013 Epigastric pain: Secondary | ICD-10-CM | POA: Diagnosis not present

## 2018-09-06 DIAGNOSIS — M542 Cervicalgia: Secondary | ICD-10-CM
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
## 2018-09-14 ENCOUNTER — Ambulatory Visit: Payer: Managed Care, Other (non HMO) | Admitting: Family

## 2018-09-17 ENCOUNTER — Other Ambulatory Visit: Payer: Self-pay

## 2018-09-17 ENCOUNTER — Ambulatory Visit (INDEPENDENT_AMBULATORY_CARE_PROVIDER_SITE_OTHER): Payer: Managed Care, Other (non HMO)

## 2018-09-17 ENCOUNTER — Other Ambulatory Visit (INDEPENDENT_AMBULATORY_CARE_PROVIDER_SITE_OTHER): Payer: Managed Care, Other (non HMO)

## 2018-09-17 DIAGNOSIS — M542 Cervicalgia: Secondary | ICD-10-CM

## 2018-09-17 DIAGNOSIS — R1013 Epigastric pain: Secondary | ICD-10-CM

## 2018-09-17 DIAGNOSIS — K219 Gastro-esophageal reflux disease without esophagitis: Secondary | ICD-10-CM

## 2018-09-17 LAB — CBC WITH DIFFERENTIAL/PLATELET
Basophils Absolute: 0 10*3/uL (ref 0.0–0.1)
Basophils Relative: 0.4 % (ref 0.0–3.0)
Eosinophils Absolute: 0.3 10*3/uL (ref 0.0–0.7)
Eosinophils Relative: 4.3 % (ref 0.0–5.0)
HCT: 39.9 % (ref 39.0–52.0)
Hemoglobin: 13.2 g/dL (ref 13.0–17.0)
Lymphocytes Relative: 35 % (ref 12.0–46.0)
Lymphs Abs: 2.6 10*3/uL (ref 0.7–4.0)
MCHC: 33 g/dL (ref 30.0–36.0)
MCV: 89.3 fl (ref 78.0–100.0)
Monocytes Absolute: 0.5 10*3/uL (ref 0.1–1.0)
Monocytes Relative: 6.5 % (ref 3.0–12.0)
Neutro Abs: 4 10*3/uL (ref 1.4–7.7)
Neutrophils Relative %: 53.8 % (ref 43.0–77.0)
Platelets: 255 10*3/uL (ref 150.0–400.0)
RBC: 4.46 Mil/uL (ref 4.22–5.81)
RDW: 14.8 % (ref 11.5–15.5)
WBC: 7.5 10*3/uL (ref 4.0–10.5)

## 2018-09-17 LAB — COMPREHENSIVE METABOLIC PANEL
ALT: 11 U/L (ref 0–53)
AST: 14 U/L (ref 0–37)
Albumin: 4.4 g/dL (ref 3.5–5.2)
Alkaline Phosphatase: 76 U/L (ref 39–117)
BUN: 11 mg/dL (ref 6–23)
CO2: 28 mEq/L (ref 19–32)
Calcium: 9.4 mg/dL (ref 8.4–10.5)
Chloride: 103 mEq/L (ref 96–112)
Creatinine, Ser: 0.99 mg/dL (ref 0.40–1.50)
GFR: 82.1 mL/min (ref >60.00)
Glucose, Bld: 80 mg/dL (ref 70–99)
Potassium: 3.9 mEq/L (ref 3.5–5.1)
Sodium: 141 mEq/L (ref 135–145)
Total Bilirubin: 0.3 mg/dL (ref 0.2–1.2)
Total Protein: 6.6 g/dL (ref 6.0–8.3)

## 2018-09-17 LAB — B12 AND FOLATE PANEL
Folate: >24.7 ng/mL (ref >5.9)
Vitamin B-12: >1500 pg/mL — ABNORMAL HIGH (ref 211–911)

## 2018-09-17 LAB — TSH: TSH: 1.21 u[IU]/mL (ref 0.35–4.50)

## 2018-09-17 LAB — MAGNESIUM: Magnesium: 1.9 mg/dL (ref 1.5–2.5)

## 2018-09-19 ENCOUNTER — Other Ambulatory Visit: Payer: Self-pay

## 2018-09-19 ENCOUNTER — Encounter: Payer: Self-pay | Admitting: Family

## 2018-09-19 ENCOUNTER — Ambulatory Visit (INDEPENDENT_AMBULATORY_CARE_PROVIDER_SITE_OTHER): Payer: Managed Care, Other (non HMO) | Admitting: Family

## 2018-09-19 VITALS — Ht 74.0 in | Wt 153.0 lb

## 2018-09-19 DIAGNOSIS — F419 Anxiety disorder, unspecified: Secondary | ICD-10-CM | POA: Diagnosis not present

## 2018-09-19 DIAGNOSIS — R1013 Epigastric pain: Secondary | ICD-10-CM | POA: Diagnosis not present

## 2018-09-19 DIAGNOSIS — R079 Chest pain, unspecified: Secondary | ICD-10-CM

## 2018-09-19 MED ORDER — HYDROXYZINE HCL 25 MG PO TABS: 25.0000 mg | ORAL_TABLET | Freq: Three times a day (TID) | ORAL | 0 refills | Status: DC | PRN

## 2018-09-19 NOTE — Patient Instructions (Addendum)
Trial of atarax  Follow up in one week with one of colleagues.  Today we discussed referrals, orders. [place] of right upper quadrant    I have placed these orders in the system for you.  Please be sure to give [place] a call if you have not heard from our office regarding this. We should hear from [place] within ONE week with information regarding your appointment. If not, please let me know immediately.   Let us know if any symptom were to worsen or change in any way.

## 2018-09-19 NOTE — Assessment & Plan Note (Addendum)
Worse of late; particularly as it relates to his health. He never started buspar. He preferred medication more PRN; have given atarax to trial with close follow up.  Re-emphasized staying hypervigilant in regards to symptoms and if any new or worsening thereof particularly with limitations of virtual medicine today,  advised UC or ED.

## 2018-09-19 NOTE — Progress Notes (Signed)
This visit type was conducted due to national recommendations for restrictions regarding the COVID-19 pandemic (e.g. social distancing).  This format is felt to be most appropriate for this patient at this time.  All issues noted in this document were discussed and addressed.  No physical exam was performed (except for noted visual exam findings with Video Visits). Virtual Visit via Video Note  I connected with@  on 09/19/18 at  2:00 PM EDT by a video enabled telemedicine application and verified that I am speaking with the correct person using two identifiers.  Location patient: home Location provider:work  Persons participating in the virtual visit: patient, provider  I discussed the limitations of evaluation and management by telemedicine and the availability of in person appointments. The patient expressed understanding and agreed to proceed.   HPI:  CC: increased anxiety and panic attacks for the past couple of weeks. Feels anxious talking about it right now in car. Embarrassed, particularly as will have anxiety attacks at work.  Triggered when he starts to worry about health. 'Im a worrier.' He will feel 'lump in his throat' and feel short of breath during anxiety attack. Otherwise, he does not feel SOB. No SOB at this time.  He feels well during our video and states no SOB or right sided chest pain.  No cough, wheezing.    No depression. Never tried buspar.     He also continues to complain of pain over right side of chest and radiating to right mid back, which occurs after eating and other times unpredicatable. Has been on going for several months, unchanged. Not more frequent.  Feels like burping more and more 'gasy'. No abdominal pain, fever, weight loss.  No  epigastric burning, regurgitation, left sided side chest pain.  He continues to follow up with Dr Sharyn LullHarwani , cardiologist; last OV 2 weeks ago, and per patient had EKG which was normal per patient. Unable to see records of this.     Pending Appointment with Dr Mariah MillingGollan 09/25/18  EGD is scheduled 09/27/18. [facility].   Elevated b12 09/17/18- has since stopped b12.   ROS: See pertinent positives and negatives per HPI.  Past Medical History:  Diagnosis Date  . Arthritis   . Hypertension     History reviewed. No pertinent surgical history.  Family History  Problem Relation Age of Onset  . Hyperlipidemia Mother   . Hyperlipidemia Father   . Hearing loss Father   . Diabetes Father   . Arthritis Father     SOCIAL HX: former smoker   Current Outpatient Medications:  .  cyclobenzaprine (FLEXERIL) 5 MG tablet, Take 1 tablet (5 mg total) by mouth at bedtime as needed for muscle spasms., Disp: 30 tablet, Rfl: 1 .  esomeprazole (NEXIUM) 40 MG capsule, Take 1 capsule (40 mg total) by mouth daily. (Patient taking differently: Take 40 mg by mouth 2 (two) times daily before a meal. Take two tablets by mouth before breakfast & two tablets before dinner.), Disp: 90 capsule, Rfl: 0 .  famotidine (PEPCID) 20 MG tablet, Take 20 mg by mouth 2 (two) times daily., Disp: , Rfl:  .  metoprolol succinate (TOPROL-XL) 25 MG 24 hr tablet, Take 1 tablet by mouth daily., Disp: , Rfl:  .  pregabalin (LYRICA) 150 MG capsule, TAKE ONE CAPSULE BY MOUTH THREE TIMES A DAY, Disp: 90 capsule, Rfl: 1 .  sildenafil (VIAGRA) 50 MG tablet, TAKE 1/2 TABLET BY MOUTH DAILY AS NEEDED FOR  ERECTILE DYSFUNCTION (TAKE 30 MINUTES TO 4 HOURS BEFORE SEXUAL ACTIVITY), Disp: 10 tablet, Rfl: 1 .  sucralfate (CARAFATE) 1 g tablet, Take 1 tablet by mouth 4 (four) times daily -  with meals and at bedtime. , Disp: , Rfl:  .  traMADol (ULTRAM) 50 MG tablet, TAKE TWO TABLETS BY MOUTH EVERY 12 HOURS AS NEEDED FOR PAIN, Disp: 120 tablet, Rfl: 1 .  hydrOXYzine (ATARAX/VISTARIL) 25 MG tablet, Take 1 tablet (25 mg total) by mouth every 8 (eight) hours as needed for anxiety., Disp: 30 tablet, Rfl: 0  EXAM:  VITALS per patient if  applicable:  GENERAL: alert, oriented, appears well and in no acute distress  HEENT: atraumatic, conjunttiva clear, no obvious abnormalities on inspection of external nose and ears  NECK: normal movements of the head and neck  LUNGS: on inspection no signs of respiratory distress, breathing rate appears normal, no obvious gross SOB, gasping or wheezing  CV: no obvious cyanosis  MS: moves all visible extremities without noticeable abnormality  PSYCH/NEURO: pleasant and cooperative, no obvious depression or anxiety, speech and thought processing grossly intact  ASSESSMENT AND PLAN:  Discussed the following assessment and plan:  Problem List Items Addressed This Visit      Other   Anxiety - Primary    Worse of late; particularly as it relates to his health. He never started buspar. He preferred medication more PRN; have given atarax to trial with close follow up.  Re-emphasized staying hypervigilant in regards to symptoms and if any new or worsening thereof particularly with limitations of virtual medicine today,  advised UC or ED.       Relevant Medications   hydrOXYzine (ATARAX/VISTARIL) 25 MG tablet   Right-sided chest pain    Patient never pursued CT angio. He feels well at this time and the SOB is most associated with anxiety.   We discussed today the relationship with right sided pain and eating. We have ordered US RUQ to evaluate for gallbladder etiology. Recent liver function tests, bilirubin normal.       Relevant Orders   US ABDOMEN LIMITED RUQ   Epigastric pain    Pending EGD 09/27/18; will follow.          -we discussed possible serious and likely etiologies, options for evaluation and workup, limitations of telemedicine visit vs in person visit, treatment, treatment risks and precautions. Pt prefers to treat via telemedicine empirically rather then risking or undertaking an in person visit at this moment. Patient agrees to seek prompt in person care if worsening,  new symptoms arise, or if is not improving with treatment.   I discussed the assessment and treatment plan with the patient. The patient was provided an opportunity to ask questions and all were answered. The patient agreed with the plan and demonstrated an understanding of the instructions.   The patient was advised to call back or seek an in-person evaluation if the symptoms worsen or if the condition fails to improve as anticipated.   Mable Paris, FNP

## 2018-09-19 NOTE — Assessment & Plan Note (Signed)
Pending EGD 09/27/18; will follow.

## 2018-09-19 NOTE — Assessment & Plan Note (Signed)
Patient never pursued CT angio. He feels well at this time and the SOB is most associated with anxiety.   We discussed today the relationship with right sided pain and eating. We have ordered US RUQ to evaluate for gallbladder etiology. Recent liver function tests, bilirubin normal.

## 2018-09-21 ENCOUNTER — Telehealth: Payer: Self-pay

## 2018-09-21 NOTE — Telephone Encounter (Signed)
Could you please check on referral for [place].  Patient said that Joycelyn Schmid put in an order on 09/19/18 and told him he should have heard from someone that afternoon on Wednesday but has not heard anything.

## 2018-09-21 NOTE — Telephone Encounter (Signed)
Patient c/o of hurting on right side and middle of his chest.  No shortness of breath.  Patient said that pain has gotten worse since Wednesday when he had a visit with Joycelyn Schmid. Patient said that Joycelyn Schmid told him he should have heard from someone regarding [place] on Wednesday afternoon but he hasn't heard anything yet.  Patient was calling to check on status.  Patient said that he feels he can make it through until next week for the referral.

## 2018-09-21 NOTE — Telephone Encounter (Signed)
Copied from [place] [id number]. Topic: General - Inquiry >> Sep 21, 2018  4:08 PM Virl Axe D wrote: Reason for CRM: Pt called to follow up on [place] appt as he has not heard anything yet and stated he is getting worse. Please advise.

## 2018-09-21 NOTE — Telephone Encounter (Signed)
If worsening pain, I recommend evaluation at urgent care.

## 2018-09-21 NOTE — Telephone Encounter (Signed)
Patient given recommendation to go to urgent care.  Patient declined stating he was not going to urgent care to spend a bunch of money for something he already knows. Patient wanted to know when he would hear back from someone regarding scheduling the [place].  Informed pt that I will forward a message to the referral coordinator to check on [place] appt.

## 2018-09-24 ENCOUNTER — Telehealth: Payer: Self-pay

## 2018-09-24 NOTE — Progress Notes (Signed)
Cardiology Office Note  Date:  09/25/2018   ID:  Philip Stephens, DOB Aug 16, 1975, MRN [id number]  PCP:  Philip Hawthorne, FNP   Chief Complaint  Patient presents with  . New Patient (Initial Visit)    Referred by PCP for chest pain. Meds reviewed verbally with patient.     HPI:  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 Terrence Stephens Started on metoprolol for tachycardia Reports sometimes heart rate up to 160 bpm, metoprolol helps Today heart rate in the 60s  Testing has been ordered by primary care, he has not done any of this  Takes BC powders twice a day for joint pain back pain  EKG personally reviewed by myself on todays visit Shows normal sinus rhythm rate 67 bpm no significant ST or T wave changes   PMH:   has a past medical history of Arthritis and Hypertension.  PSH:   History reviewed. No pertinent surgical history.  Current Outpatient Medications  Medication Sig Dispense Refill  . esomeprazole (NEXIUM) 40 MG capsule 10 mg. Take 2 tablets by mouth every morning and 2 tablets by mouth every afternoon.    . famotidine (PEPCID) 20 MG tablet Take 20 mg by mouth 2 (two) times daily.    . metoprolol succinate (TOPROL-XL) 25 MG 24 hr tablet Take 1 tablet by mouth daily.    . pregabalin (LYRICA) 150 MG capsule TAKE ONE CAPSULE BY MOUTH THREE TIMES  A DAY 90 capsule 1  . sucralfate (CARAFATE) 1 g tablet Take 1 tablet by mouth 4 (four) times daily -  with meals and at bedtime.     . traMADol (ULTRAM) 50 MG tablet TAKE TWO TABLETS BY MOUTH EVERY 12 HOURS AS NEEDED FOR PAIN 120 tablet 1  . sildenafil (VIAGRA) 50 MG tablet TAKE 1/2 TABLET BY MOUTH DAILY AS NEEDED FOR ERECTILE DYSFUNCTION (TAKE 30 MINUTES TO 4 HOURS BEFORE SEXUAL ACTIVITY) (Patient not taking: Reported on 09/25/2018) 10 tablet 1   No current facility-administered medications for this visit.      Allergies:   Penicillins   Social History:  The patient  reports that he quit smoking about 7 months ago. He has quit using smokeless tobacco. He reports previous alcohol use. He reports that he does not use drugs.   Family History:   family history includes Arthritis in his father; Diabetes in his father; Hearing loss in his father; Hyperlipidemia in his father and mother.    Review of Systems: Review of Systems  Constitutional: Negative.   HENT: Negative.   Respiratory: Negative.   Cardiovascular: Negative.   Gastrointestinal: Negative.   Musculoskeletal: Negative.   Neurological: Negative.   Psychiatric/Behavioral: Negative.   All other systems reviewed and are negative.    PHYSICAL EXAM: VS:  BP 110/80 (BP Location: Right Arm, Patient Position: Sitting, Cuff  Size: Normal)   Pulse 67   Ht 6\' 2"  (1.88 m)   Wt 158 lb (71.7 kg)   BMI 20.29 kg/m  , BMI Body mass index is 20.29 kg/m. GEN: Well nourished, well developed, in no acute distress HEENT: normal Neck: no JVD, carotid bruits, or masses Cardiac: RRR; no murmurs, rubs, or gallops,no edema  Respiratory:  clear to auscultation bilaterally, normal work of breathing GI: soft, nontender, nondistended, + BS MS: no deformity or atrophy Skin: warm and dry, no rash Neuro:  Strength and sensation are intact Psych: euthymic mood, full affect  Recent Labs: 09/17/2018: ALT 11; BUN 11; Creatinine, Ser 0.99; Hemoglobin  13.2; Magnesium 1.9; Platelets 255.0; Potassium 3.9; Sodium 141; TSH 1.21    Lipid Panel Lab Results  Component Value Date   CHOL 247 (H) 04/19/2018   HDL 44.90 04/19/2018   TRIG 240.0 (H) 04/19/2018      Wt Readings from Last 3 Encounters:  09/25/18 158 lb (71.7 kg)  09/19/18 153 lb (69.4 kg)  09/06/18 153 lb (69.4 kg)       ASSESSMENT AND PLAN:  Problem List Items Addressed This Visit    Anxiety   Right-sided chest pain   Relevant Orders   EKG 12-Lead   CT CARDIAC SCORING   Epigastric pain - Primary   Relevant Orders   EKG 12-Lead   Numbness in both hands     Musculoskeletal rib pain On exam today tenderness right rib Region of muscle spasm in that area Pain seems to radiate around to his thoracic spine He does report having periodic discomfort between scapula and spine consistent with the appropriate level -When he has pain recommended he avoid heavy lifting at work He would probably benefit from seeing a chiropractor Ice, NSAIDs as needed for pain -Reassurance provided  With that said we did discuss long smoking history, hyperlipidemia Suggested CT coronary calcium scoring for risk stratification, and whether he needs to be on a statin For elevated calcium score would treat aggressively  Anxiety Reassurance provided Recommended regular walking program, recent job changes less active  Disposition:   F/U  12 months   Total encounter time more than 60 minutes  Greater than 50% was spent in counseling and coordination of care with the patient  Patient was seen in consultation for 11/06/18 and will be referred back to her office for ongoing care of the issues detailed above Signed, Philip Stephens, M.D., Ph.D. [facility] [place], [place]

## 2018-09-24 NOTE — Telephone Encounter (Signed)
LM asking for patient to call back to schedule him one weel f/u after starting atarax.

## 2018-09-25 ENCOUNTER — Encounter: Payer: Self-pay | Admitting: Cardiovascular Disease

## 2018-09-25 ENCOUNTER — Ambulatory Visit (INDEPENDENT_AMBULATORY_CARE_PROVIDER_SITE_OTHER): Payer: Managed Care, Other (non HMO) | Admitting: Cardiovascular Disease

## 2018-09-25 ENCOUNTER — Encounter: Payer: Self-pay | Admitting: Family

## 2018-09-25 ENCOUNTER — Other Ambulatory Visit: Payer: Self-pay

## 2018-09-25 VITALS — BP 110/80 | HR 67 | Ht 74.0 in | Wt 158.0 lb

## 2018-09-25 DIAGNOSIS — R079 Chest pain, unspecified: Secondary | ICD-10-CM | POA: Diagnosis not present

## 2018-09-25 DIAGNOSIS — R200 Anesthesia of skin: Secondary | ICD-10-CM

## 2018-09-25 DIAGNOSIS — F419 Anxiety disorder, unspecified: Secondary | ICD-10-CM | POA: Diagnosis not present

## 2018-09-25 DIAGNOSIS — R1013 Epigastric pain: Secondary | ICD-10-CM | POA: Diagnosis not present

## 2018-09-25 NOTE — Telephone Encounter (Signed)
Order was placed as routine, typically if Joycelyn Schmid wants something  scheduled asap she will let me know and order it as urgent. He is aware of appointment. Melissa

## 2018-09-25 NOTE — Telephone Encounter (Signed)
Actually,  Her message to him, per mychart,   was that he should hear from [place] within  One WEEK about the ultrasound.  I see that the abdominal ultrasound has been scheduled. Please confirm that patient is aware.

## 2018-09-25 NOTE — Patient Instructions (Addendum)
We will order a CT coronary calcium score For chest pain   Chiropactor: nicole [phone]  Medication Instructions:  No changes  If you need a refill on your cardiac medications before your next appointment, please call your pharmacy.    Lab work: No new labs needed   If you have labs (blood work) drawn today and your tests are completely normal, you will receive your results only by: Marland Kitchen MyChart Message (if you have MyChart) OR . A paper copy in the mail If you have any lab test that is abnormal or we need to change your treatment, we will call you to review the results.   Testing/Procedures: Dr. Mariah Milling has ordered a CT coronary calcium score. This test is done at [address]. This is $150 out of pocket.   Coronary CalciumScan A coronary calcium scan is an imaging test used to look for deposits of calcium and other fatty materials (plaques) in the inner lining of the blood vessels of the heart (coronary arteries). These deposits of calcium and plaques can partly clog and narrow the coronary arteries without producing any symptoms or warning signs. This puts a person at risk for a heart attack. This test can detect these deposits before symptoms develop. Tell a health care provider about:  Any allergies you have.  All medicines you are taking, including vitamins, herbs, eye drops, creams, and over-the-counter medicines.  Any problems you or family members have had with anesthetic medicines.  Any blood disorders you have.  Any surgeries you have had.  Any medical conditions you have.  Whether you are pregnant or may be pregnant. What are the risks? Generally, this is a safe procedure. However, problems may occur, including:  Harm to a pregnant woman and her unborn baby. This test involves the use of radiation. Radiation exposure can be dangerous to a pregnant woman and her unborn baby. If you are pregnant, you generally should not have this procedure  done.  Slight increase in the risk of cancer. This is because of the radiation involved in the test. What happens before the procedure? No preparation is needed for this procedure. What happens during the procedure?  You will undress and remove any jewelry around your neck or chest.  You will put on a hospital gown.  Sticky electrodes will be placed on your chest. The electrodes will be connected to an electrocardiogram (ECG) machine to record a tracing of the electrical activity of your heart.  A CT scanner will take pictures of your heart. During this time, you will be asked to lie still and hold your breath for 2-3 seconds while a picture of your heart is being taken. The procedure may vary among health care providers and hospitals. What happens after the procedure?  You can get dressed.  You can return to your normal activities.  It is up to you to get the results of your test. Ask your health care provider, or the department that is doing the test, when your results will be ready. Summary  A coronary calcium scan is an imaging test used to look for deposits of calcium and other fatty materials (plaques) in the inner lining of the blood vessels of the heart (coronary arteries).  Generally, this is a safe procedure. Tell your health care provider if you are pregnant or may be pregnant.  No preparation is needed for this procedure.  A CT scanner will take pictures of your heart.  You can return to  your normal activities after the scan is done. This information is not intended to replace advice given to you by your health care provider. Make sure you discuss any questions you have with your health care provider. Document Released: 06/18/2007 Document Revised: 11/09/2015 Document Reviewed: 11/09/2015 Elsevier Interactive Patient Education  2017 [place]: At [facility], you and your health needs are our priority.  As part of our continuing mission to  provide you with exceptional heart care, we have created designated Provider Care Teams.  These Care Teams include your primary Cardiologist (physician) and Advanced Practice Providers (APPs -  Physician Assistants and Nurse Practitioners) who all work together to provide you with the care you need, when you need it.  . You will need a follow up appointment as needed .  Marland Kitchen Providers on your designated Care Team:   . Murray Hodgkins, NP . Christell Faith, PA-C . Marrianne Mood, PA-C  Any Other Special Instructions Will Be Listed Below (If Applicable).  For educational health videos Log in to : [email or web address] Or : [email or web address], password : [email or web address]

## 2018-09-28 ENCOUNTER — Ambulatory Visit: Payer: Managed Care, Other (non HMO)

## 2018-09-29 ENCOUNTER — Other Ambulatory Visit: Payer: Self-pay | Admitting: Family

## 2018-09-29 DIAGNOSIS — G8929 Other chronic pain: Secondary | ICD-10-CM

## 2018-09-29 DIAGNOSIS — M545 Low back pain, unspecified: Secondary | ICD-10-CM

## 2018-10-01 ENCOUNTER — Encounter: Payer: Self-pay | Admitting: Family

## 2018-10-11 ENCOUNTER — Other Ambulatory Visit: Payer: Self-pay | Admitting: Family

## 2018-10-11 ENCOUNTER — Encounter: Payer: Self-pay | Admitting: Family

## 2018-10-11 DIAGNOSIS — G8929 Other chronic pain: Secondary | ICD-10-CM

## 2018-10-11 DIAGNOSIS — M545 Low back pain, unspecified: Secondary | ICD-10-CM

## 2018-10-11 NOTE — Telephone Encounter (Signed)
Need to clarify what medication he is taking.  I reviewed meds here and reviewed phone message.  Per message, he was also going to start buspar and was questioning if he could take with flexeril.  Concerned about interaction with all of his medications and need to know all taking.  Just need to clarify before prescribing.

## 2018-10-15 ENCOUNTER — Inpatient Hospital Stay: Admission: RE | Admit: 2018-10-15 | Payer: Managed Care, Other (non HMO) | Source: Ambulatory Visit

## 2018-10-24 ENCOUNTER — Other Ambulatory Visit: Payer: Self-pay | Admitting: Family Medicine

## 2018-10-24 ENCOUNTER — Encounter: Payer: Self-pay | Admitting: Family

## 2018-10-24 DIAGNOSIS — G8929 Other chronic pain: Secondary | ICD-10-CM

## 2018-10-24 DIAGNOSIS — M545 Low back pain, unspecified: Secondary | ICD-10-CM

## 2018-10-24 NOTE — Telephone Encounter (Signed)
No sure what the issue is with lyrica lauren guse sent in 10/24/18  Call pts pharmacy and confirm receipt asap   [place]

## 2018-10-25 NOTE — Telephone Encounter (Signed)
Patient received medication. I just didn't realize I had sent twice.

## 2018-10-26 ENCOUNTER — Encounter: Payer: Self-pay | Admitting: Family

## 2018-10-26 DIAGNOSIS — G8929 Other chronic pain: Secondary | ICD-10-CM

## 2018-10-26 DIAGNOSIS — M542 Cervicalgia: Secondary | ICD-10-CM

## 2018-10-26 DIAGNOSIS — M545 Low back pain, unspecified: Secondary | ICD-10-CM

## 2018-10-26 NOTE — Telephone Encounter (Signed)
PT order in

## 2018-10-29 ENCOUNTER — Ambulatory Visit: Payer: Managed Care, Other (non HMO) | Admitting: Family Medicine

## 2018-10-29 ENCOUNTER — Telehealth: Payer: Self-pay

## 2018-10-29 NOTE — Telephone Encounter (Signed)
Attempted to call LM asking patient to call back or sign on to doxy. Patient did neither. I no showed his appointment for today with Philip Nettle, FNP.

## 2018-10-30 ENCOUNTER — Other Ambulatory Visit: Payer: Self-pay | Admitting: Family

## 2018-10-30 DIAGNOSIS — M545 Low back pain, unspecified: Secondary | ICD-10-CM

## 2018-10-30 DIAGNOSIS — G8929 Other chronic pain: Secondary | ICD-10-CM

## 2018-11-05 ENCOUNTER — Ambulatory Visit (INDEPENDENT_AMBULATORY_CARE_PROVIDER_SITE_OTHER)
Admission: RE | Admit: 2018-11-05 | Discharge: 2018-11-05 | Disposition: A | Discharge status: Home / Self Care | Payer: Self-pay | Source: Ambulatory Visit | Attending: Cardiovascular Disease | Admitting: Cardiovascular Disease

## 2018-11-05 ENCOUNTER — Other Ambulatory Visit: Payer: Self-pay

## 2018-11-05 DIAGNOSIS — R079 Chest pain, unspecified: Secondary | ICD-10-CM

## 2018-11-06 ENCOUNTER — Ambulatory Visit: Payer: Managed Care, Other (non HMO) | Admitting: Physical Therapy

## 2018-11-08 ENCOUNTER — Encounter: Payer: Self-pay | Admitting: Family

## 2018-11-08 ENCOUNTER — Other Ambulatory Visit: Payer: Self-pay | Admitting: Internal Medicine

## 2018-11-08 DIAGNOSIS — G8929 Other chronic pain: Secondary | ICD-10-CM

## 2018-11-08 DIAGNOSIS — M545 Low back pain, unspecified: Secondary | ICD-10-CM

## 2018-11-09 MED ORDER — TRAMADOL HCL 50 MG PO TABS: ORAL_TABLET | ORAL | 0 refills | Status: DC

## 2018-11-09 NOTE — Telephone Encounter (Signed)
Patient last fill was 10/11/18 for 120 on Tramadol I have pended the script patient says he knows he is not due until Sunday but would be no way to fill on the weekend

## 2018-11-09 NOTE — Telephone Encounter (Signed)
rx already refilled by Ander Purpura

## 2018-11-13 ENCOUNTER — Encounter: Payer: Managed Care, Other (non HMO) | Admitting: Physical Therapy

## 2018-11-13 ENCOUNTER — Telehealth: Payer: Self-pay | Admitting: Cardiovascular Disease

## 2018-11-13 NOTE — Progress Notes (Signed)
Virtual Visit via Video Note   This visit type was conducted due to national recommendations for restrictions regarding the COVID-19 Pandemic (e.g. social distancing) in an effort to limit this patient's exposure and mitigate transmission in our community.  Due to his co-morbid illnesses, this patient is at least at moderate risk for complications without adequate follow up.  This format is felt to be most appropriate for this patient at this time.  All issues noted in this document were discussed and addressed.  A limited physical exam was performed with this format.  Please refer to the patient's chart for his consent to telehealth for [facility].   I connected with  Philip Stephens on 11/13/18 by a video enabled telemedicine application and verified that I am speaking with the correct person using two identifiers. I discussed the limitations of evaluation and management by telemedicine. The patient expressed understanding and agreed to proceed.   Evaluation Performed:  Follow-up visit  Date:  11/13/2018   ID:  Philip Stephens, DOB 10/13/1975, MRN [id number]  Patient Location:  [address] [place] [address]   Provider location:   [facility], [place] office  PCP:  Allegra Grana, FNP  Cardiologist:  [facility]   Chief Complaint:      History of Present Illness:    Philip Stephens is a 43 y.o. male who presents via audio/video conferencing for a telehealth visit today.   The patient does not symptoms concerning for COVID-19 infection (fever, chills, cough, or new SHORTNESS OF BREATH).   Patient has a past medical history of Anxiety panic attacks Chronic back pain Former smoker, quit 02/2018 Who presents for follow-up of his chest pain in July 2020  Coronary calcium score 11/2018, results discussed with him  of 3. This was 78th percentile for age and sex matched control.  Denies any further pain concerning for angina  Occasional musculoskeletal pain but otherwise feels well Works in maintenance, lifts heavy items  Discussed his cholesterol, Total cholesterol up to 250 Would like to recheck the numbers before he does any treatment   Other past medical history reviewed He reports that in November 2019, was opening a door when he felt a pop, hurt his chest on the right Went to the ER, "checked out ok" Has had stuttering pain since that time  Prior CV studies:   The following studies were reviewed today:    Past Medical History:  Diagnosis Date  . Arthritis   . Hypertension    No past surgical history on file.    Allergies:   Penicillins   Social History   Tobacco Use  . Smoking status: Former Smoker    Quit date: 02/03/2018    Years since quitting: 0.7  . Smokeless tobacco: Former Neurosurgeon  . Tobacco comment: Does Velo. ( has nicotine , no tobacco).   Substance Use Topics  . Alcohol use: Not Currently  . Drug use: Never     Current Outpatient Medications on File Prior to Visit  Medication Sig Dispense Refill  . cyclobenzaprine (FLEXERIL) 5 MG tablet TAKE ONE TABLET BY MOUTH EVERY NIGHT AT BEDTIME AS NEEDED FOR MUSCLE SPASMS 30 tablet 0  . esomeprazole (NEXIUM) 40 MG capsule 10 mg. Take 2 tablets by mouth every morning and 2 tablets by mouth every afternoon.    . famotidine (PEPCID) 20 MG tablet Take 20 mg by mouth 2 (two) times daily.    . metoprolol  succinate (TOPROL-XL) 25 MG 24 hr tablet Take 1 tablet by mouth daily.    . pregabalin (LYRICA) 150 MG capsule TAKE ONE CAPSULE BY MOUTH THREE TIMES A DAY 90 capsule 0  . sildenafil (VIAGRA) 50 MG tablet TAKE 1/2 TABLET BY MOUTH DAILY AS NEEDED FOR ERECTILE DYSFUNCTION (TAKE 30 MINUTES TO 4 HOURS BEFORE SEXUAL ACTIVITY) (Patient not taking: Reported on 09/25/2018) 10 tablet 1  . sucralfate (CARAFATE) 1 g tablet Take 1 tablet by mouth 4 (four) times daily -  with meals and at bedtime.     . traMADol (ULTRAM) 50 MG tablet TAKE 2 TABLETS BY MOUTH  EVERY 12 HOURS AS NEEDED FOR PAIN 120 tablet 0   No current facility-administered medications on file prior to visit.      Family Hx: The patient's family history includes Arthritis in his father; Diabetes in his father; Hearing loss in his father; Hyperlipidemia in his father and mother.  ROS:   Please see the history of present illness.    Review of Systems  Constitutional: Negative.   HENT: Negative.   Respiratory: Negative.   Cardiovascular: Negative.   Gastrointestinal: Negative.   Musculoskeletal: Negative.   Neurological: Negative.   Psychiatric/Behavioral: Negative.   All other systems reviewed and are negative.    Labs/Other Tests and Data Reviewed:    Recent Labs: 09/17/2018: ALT 11; BUN 11; Creatinine, Ser 0.99; Hemoglobin 13.2; Magnesium 1.9; Platelets 255.0; Potassium 3.9; Sodium 141; TSH 1.21   Recent Lipid Panel Lab Results  Component Value Date/Time   CHOL 247 (H) 04/19/2018 10:21 AM   TRIG 240.0 (H) 04/19/2018 10:21 AM   HDL 44.90 04/19/2018 10:21 AM   CHOLHDL 6 04/19/2018 10:21 AM   LDLDIRECT 150.0 04/19/2018 10:21 AM    Wt Readings from Last 3 Encounters:  09/25/18 158 lb (71.7 kg)  09/19/18 153 lb (69.4 kg)  09/06/18 153 lb (69.4 kg)     Exam:    Vital Signs: Vital signs may also be detailed in the HPI There were no vitals taken for this visit.  Wt Readings from Last 3 Encounters:  09/25/18 158 lb (71.7 kg)  09/19/18 153 lb (69.4 kg)  09/06/18 153 lb (69.4 kg)   Temp Readings from Last 3 Encounters:  08/01/18 98.3 F (36.8 C)  03/14/18 98.5 F (36.9 C)  02/16/18 98 F (36.7 C)   BP Readings from Last 3 Encounters:  09/25/18 110/80  08/01/18 126/78  03/14/18 108/70   Pulse Readings from Last 3 Encounters:  09/25/18 67  08/01/18 91  03/14/18 95     Constitutional:  oriented to person, place, and time. No distress.  HENT:  Head: Grossly normal Eyes:  no discharge. No scleral icterus.  Neck: No JVD, no carotid bruits   Cardiovascular: Regular rate and rhythm, no murmurs appreciated Pulmonary/Chest: Clear to auscultation bilaterally, no wheezes or rails Abdominal: Soft.  no distension.  no tenderness.  Musculoskeletal: Normal range of motion Neurological:  normal muscle tone. Coordination normal. No atrophy Skin: Skin warm and dry Psychiatric: normal affect, pleasant   ASSESSMENT & PLAN:    Problem List Items Addressed This Visit    None     Musculoskeletal rib pain Occasional discomfort, lifting heavy items at work  Coronary calcification Calcium score of 3, minimally elevated He would like to repeat lipid panel before starting a cholesterol medication Reports that he did smoke for many years, now quit    COVID-19 Education: The signs and symptoms of COVID-19 were discussed with  the patient and how to seek care for testing (follow up with PCP or arrange E-visit).  The importance of social distancing was discussed today.  Patient Risk:   After full review of this patients clinical status, I feel that they are at least moderate risk at this time.  Time:   Today, I have spent 15 minutes with the patient with telehealth technology discussing the cardiac and medical problems/diagnoses detailed above   Additional 10 min spent reviewing the chart prior to patient visit today   Medication Adjustments/Labs and Tests Ordered: Current medicines are reviewed at length with the patient today.  Concerns regarding medicines are outlined above.   Tests Ordered: No tests ordered   Medication Changes: No changes made   Disposition: Follow-up in 12 months   Signed, Julien Nordmannimothy William Laske, MD  [facility] [place] [facility] [address], [place], [place] [id number]

## 2018-11-13 NOTE — Telephone Encounter (Signed)
mychart message received from patient. He is wanting to further discuss CT ca scoring results from Dr. Rockey Situ.   I reviewed what I could but he has additional questions.   Pt requested appt to discuss with Dr. Rockey Situ further.   Next wed Dr. Rockey Situ has opening for virtual visit. Pt would like to schedule.   Pt gave verbal consent to tele visit.    CONSENT FOR TELE-HEALTH VISIT -   I hereby voluntarily request, consent and authorize [facility] and its employed or contracted physicians, physician assistants, nurse practitioners or other licensed health care professionals (the Practitioner), to provide me with telemedicine health care services (the "Services") as deemed necessary by the treating Practitioner. I acknowledge and consent to receive the Services by the Practitioner via telemedicine. I understand that the telemedicine visit will involve communicating with the Practitioner through live audiovisual communication technology and the disclosure of certain medical information by electronic transmission. I acknowledge that I have been given the opportunity to request an in-person assessment or other available alternative prior to the telemedicine visit and am voluntarily participating in the telemedicine visit.  I understand that I have the right to withhold or withdraw my consent to the use of telemedicine in the course of my care at any time, without affecting my right to future care or treatment, and that the Practitioner or I may terminate the telemedicine visit at any time. I understand that I have the right to inspect all information obtained and/or recorded in the course of the telemedicine visit and may receive copies of available information for a reasonable fee.  I understand that some of the potential risks of receiving the Services via telemedicine include:  Marland Kitchen Delay or interruption in medical evaluation due to technological equipment failure or disruption; . Information transmitted  may not be sufficient (e.g. poor resolution of images) to allow for appropriate medical decision making by the Practitioner; and/or  . In rare instances, security protocols could fail, causing a breach of personal health information.  Furthermore, I acknowledge that it is my responsibility to provide information about my medical history, conditions and care that is complete and accurate to the best of my ability. I acknowledge that Practitioner's advice, recommendations, and/or decision may be based on factors not within their control, such as incomplete or inaccurate data provided by me or distortions of diagnostic images or specimens that may result from electronic transmissions. I understand that the practice of medicine is not an exact science and that Practitioner makes no warranties or guarantees regarding treatment outcomes. I acknowledge that I will receive a copy of this consent concurrently upon execution via email to the email address I last provided but may also request a printed copy by calling the office of [facility].    I understand that my insurance will be billed for this visit.   I have read or had this consent read to me. . I understand the contents of this consent, which adequately explains the benefits and risks of the Services being provided via telemedicine.  . I have been provided ample opportunity to ask questions regarding this consent and the Services and have had my questions answered to my satisfaction. . I give my informed consent for the services to be provided through the use of telemedicine in my medical care  By participating in this telemedicine visit I agree to the above.

## 2018-11-14 ENCOUNTER — Telehealth (INDEPENDENT_AMBULATORY_CARE_PROVIDER_SITE_OTHER): Payer: Managed Care, Other (non HMO) | Admitting: Cardiovascular Disease

## 2018-11-14 ENCOUNTER — Other Ambulatory Visit: Payer: Self-pay

## 2018-11-14 DIAGNOSIS — I2510 Atherosclerotic heart disease of native coronary artery without angina pectoris: Secondary | ICD-10-CM | POA: Diagnosis not present

## 2018-11-14 DIAGNOSIS — R079 Chest pain, unspecified: Secondary | ICD-10-CM | POA: Diagnosis not present

## 2018-11-14 DIAGNOSIS — F419 Anxiety disorder, unspecified: Secondary | ICD-10-CM

## 2018-11-14 NOTE — Patient Instructions (Signed)

## 2018-11-20 ENCOUNTER — Encounter: Payer: Managed Care, Other (non HMO) | Admitting: Physical Therapy

## 2018-11-20 ENCOUNTER — Ambulatory Visit: Payer: Managed Care, Other (non HMO) | Admitting: Physical Therapy

## 2018-11-21 ENCOUNTER — Ambulatory Visit: Payer: Managed Care, Other (non HMO) | Admitting: Cardiovascular Disease

## 2018-11-26 ENCOUNTER — Encounter: Payer: Managed Care, Other (non HMO) | Admitting: Physical Therapy

## 2018-11-27 ENCOUNTER — Encounter: Payer: Managed Care, Other (non HMO) | Admitting: Physical Therapy

## 2018-11-27 ENCOUNTER — Encounter: Payer: Self-pay | Admitting: Family

## 2018-11-27 DIAGNOSIS — G8929 Other chronic pain: Secondary | ICD-10-CM

## 2018-11-27 DIAGNOSIS — M545 Low back pain, unspecified: Secondary | ICD-10-CM

## 2018-11-27 MED ORDER — PREGABALIN 150 MG PO CAPS: 150.0000 mg | ORAL_CAPSULE | Freq: Three times a day (TID) | ORAL | 1 refills | Status: DC

## 2018-12-03 ENCOUNTER — Encounter: Payer: Managed Care, Other (non HMO) | Admitting: Physical Therapy

## 2018-12-04 ENCOUNTER — Encounter: Payer: Managed Care, Other (non HMO) | Admitting: Physical Therapy

## 2018-12-06 ENCOUNTER — Encounter: Payer: Self-pay | Admitting: Family

## 2018-12-06 DIAGNOSIS — G8929 Other chronic pain: Secondary | ICD-10-CM

## 2018-12-06 DIAGNOSIS — M545 Low back pain, unspecified: Secondary | ICD-10-CM

## 2018-12-06 MED ORDER — TRAMADOL HCL 50 MG PO TABS: ORAL_TABLET | ORAL | 0 refills | Status: DC

## 2018-12-06 NOTE — Telephone Encounter (Signed)
rx ok'd for tramadol.  

## 2018-12-10 ENCOUNTER — Encounter: Payer: Managed Care, Other (non HMO) | Admitting: Physical Therapy

## 2018-12-11 ENCOUNTER — Encounter: Payer: Managed Care, Other (non HMO) | Admitting: Physical Therapy

## 2018-12-12 ENCOUNTER — Other Ambulatory Visit: Payer: Self-pay

## 2018-12-12 DIAGNOSIS — G8929 Other chronic pain: Secondary | ICD-10-CM

## 2018-12-12 DIAGNOSIS — M545 Low back pain, unspecified: Secondary | ICD-10-CM

## 2018-12-12 MED ORDER — CYCLOBENZAPRINE HCL 5 MG PO TABS: ORAL_TABLET | ORAL | 1 refills | Status: DC

## 2018-12-17 ENCOUNTER — Ambulatory Visit (INDEPENDENT_AMBULATORY_CARE_PROVIDER_SITE_OTHER): Payer: Managed Care, Other (non HMO) | Admitting: Family

## 2018-12-17 ENCOUNTER — Other Ambulatory Visit: Payer: Self-pay

## 2018-12-17 ENCOUNTER — Encounter: Payer: Managed Care, Other (non HMO) | Admitting: Physical Therapy

## 2018-12-17 ENCOUNTER — Encounter: Payer: Self-pay | Admitting: Family

## 2018-12-17 VITALS — Ht 74.0 in | Wt 152.0 lb

## 2018-12-17 DIAGNOSIS — I2584 Coronary atherosclerosis due to calcified coronary lesion: Secondary | ICD-10-CM

## 2018-12-17 DIAGNOSIS — M545 Low back pain, unspecified: Secondary | ICD-10-CM

## 2018-12-17 DIAGNOSIS — K219 Gastro-esophageal reflux disease without esophagitis: Secondary | ICD-10-CM

## 2018-12-17 DIAGNOSIS — I2510 Atherosclerotic heart disease of native coronary artery without angina pectoris: Secondary | ICD-10-CM

## 2018-12-17 DIAGNOSIS — F419 Anxiety disorder, unspecified: Secondary | ICD-10-CM

## 2018-12-17 DIAGNOSIS — G8929 Other chronic pain: Secondary | ICD-10-CM

## 2018-12-17 MED ORDER — CYCLOBENZAPRINE HCL 10 MG PO TABS: 10.0000 mg | ORAL_TABLET | Freq: Three times a day (TID) | ORAL | 0 refills | Status: DC | PRN

## 2018-12-17 NOTE — Assessment & Plan Note (Addendum)
At baseline to improved. No longer on buspar. Declines counseling or other intervention at this time.  Will follow

## 2018-12-17 NOTE — Patient Instructions (Signed)
I increased your Flexeril as we discussed today.  Please be mindful of the below.  Do not drive or operate heavy machinery while on muscle relaxant. Please do not drink alcohol. Only take this medication as needed for acute muscle spasm at bedtime. This medication make you feel drowsy so be very careful.  Stop taking if become too drowsy or somnolent as this puts you at risk for falls. Please contact our office with any questions.   Stay safe and happy holidays!!

## 2018-12-17 NOTE — Progress Notes (Signed)
Virtual Visit via Video Note  I connected with@  on 12/17/18 at  4:00 PM EST by a video enabled telemedicine application and verified that I am speaking with the correct person using two identifiers.  Location patient: home Location provider: home office Persons participating in the virtual visit: patient, provider  I discussed the limitations of evaluation and management by telemedicine and the availability of in person appointments. The patient expressed understanding and agreed to proceed.  Interactive audio and video telecommunications were attempted between this provider and patient, however failed, due to patient having technical difficulties or patient did not have access to video capability.  We continued and completed visit with audio only.    HPI:  Feeling much better,  Feels 'back normal.'  Continues to have anxiety however improved. No longer on buspar.   Would like refill of flexeril as had taken in the past flexeril 10mg  tid prn which has been helpful for back pain. This is not overly sedating for him  Back pain is stable on regimen   GERD- largely resolved.  continues to take nexium, however is weaning off the nexium . Never had the upper GI done.   Has seen Dr Rockey Situ 09/25/18 and again - CT Cardiac minimal cardiac score.   ROS: See pertinent positives and negatives per HPI.  Past Medical History:  Diagnosis Date  . Arthritis   . Hypertension     History reviewed. No pertinent surgical history.  Family History  Problem Relation Age of Onset  . Hyperlipidemia Mother   . Hyperlipidemia Father   . Hearing loss Father   . Diabetes Father   . Arthritis Father     SOCIAL HX: former smoker   Current Outpatient Medications:  .  esomeprazole (NEXIUM) 10 MG packet, 10 mg daily at 12 noon. Take 1 tablets by mouth every morning and 2 tablets by mouth every afternoon., Disp: , Rfl:  .  metoprolol succinate (TOPROL-XL) 25 MG 24 hr tablet, Take 1 tablet by mouth  daily., Disp: , Rfl:  .  pregabalin (LYRICA) 150 MG capsule, Take 1 capsule (150 mg total) by mouth 3 (three) times daily., Disp: 90 capsule, Rfl: 1 .  sildenafil (VIAGRA) 50 MG tablet, TAKE 1/2 TABLET BY MOUTH DAILY AS NEEDED FOR ERECTILE DYSFUNCTION (TAKE 30 MINUTES TO 4 HOURS BEFORE SEXUAL ACTIVITY), Disp: 10 tablet, Rfl: 1 .  traMADol (ULTRAM) 50 MG tablet, TAKE 2 TABLETS BY MOUTH EVERY 12 HOURS AS NEEDED FOR PAIN, Disp: 120 tablet, Rfl: 0 .  cyclobenzaprine (FLEXERIL) 10 MG tablet, Take 1 tablet (10 mg total) by mouth 3 (three) times daily as needed for muscle spasms., Disp: 30 tablet, Rfl: 0    ASSESSMENT AND PLAN:  Discussed the following assessment and plan:  Chronic midline low back pain without sciatica - Plan: cyclobenzaprine (FLEXERIL) 10 MG tablet  Anxiety  Coronary artery calcification - Plan: VITAMIN D 25 Hydroxy (Vit-D Deficiency, Fractures), Lipid panel-future  Gastroesophageal reflux disease, unspecified whether esophagitis present Problem List Items Addressed This Visit      Cardiovascular and Mediastinum   Coronary artery calcification    Pending lipid panel      Relevant Orders   VITAMIN D 25 Hydroxy (Vit-D Deficiency, Fractures)   Lipid panel-future     Digestive   Gastroesophageal reflux disease    Happy to see this is much improved.  Patient is currently weaning off Nexium.  Will follow        Other   Chronic midline low  back pain without sciatica - Primary   Relevant Medications   cyclobenzaprine (FLEXERIL) 10 MG tablet   Anxiety    At baseline to improved. No longer on buspar. Declines counseling or other intervention at this time.  Will follow          -we discussed possible serious and likely etiologies, options for evaluation and workup, limitations of telemedicine visit vs in person visit, treatment, treatment risks and precautions. Pt prefers to treat via telemedicine empirically rather then risking or undertaking an in person visit at  this moment. Patient agrees to seek prompt in person care if worsening, new symptoms arise, or if is not improving with treatment.   I discussed the assessment and treatment plan with the patient. The patient was provided an opportunity to ask questions and all were answered. The patient agreed with the plan and demonstrated an understanding of the instructions.   The patient was advised to call back or seek an in-person evaluation if the symptoms worsen or if the condition fails to improve as anticipated.   [name], FNP

## 2018-12-17 NOTE — Assessment & Plan Note (Signed)
Happy to see this is much improved.  Patient is currently weaning off Nexium.  Will follow

## 2018-12-17 NOTE — Assessment & Plan Note (Signed)
Pending lipid panel 

## 2018-12-18 ENCOUNTER — Encounter: Payer: Managed Care, Other (non HMO) | Admitting: Physical Therapy

## 2018-12-31 ENCOUNTER — Other Ambulatory Visit: Payer: Managed Care, Other (non HMO)

## 2019-01-01 ENCOUNTER — Encounter: Payer: Self-pay | Admitting: Family

## 2019-01-01 ENCOUNTER — Other Ambulatory Visit: Payer: Self-pay

## 2019-01-01 ENCOUNTER — Ambulatory Visit (INDEPENDENT_AMBULATORY_CARE_PROVIDER_SITE_OTHER): Payer: Managed Care, Other (non HMO) | Admitting: Family

## 2019-01-01 VITALS — BP 136/74 | HR 95 | Ht 74.0 in | Wt 168.0 lb

## 2019-01-01 DIAGNOSIS — R079 Chest pain, unspecified: Secondary | ICD-10-CM

## 2019-01-01 DIAGNOSIS — E785 Hyperlipidemia, unspecified: Secondary | ICD-10-CM

## 2019-01-01 DIAGNOSIS — F419 Anxiety disorder, unspecified: Secondary | ICD-10-CM

## 2019-01-01 DIAGNOSIS — R1013 Epigastric pain: Secondary | ICD-10-CM | POA: Diagnosis not present

## 2019-01-01 NOTE — Patient Instructions (Addendum)
Medication Instructions:  No medication changes today.  *If you need a refill on your cardiac medications before your next appointment, please call your pharmacy*  Lab Work: No alb work today.  If you have labs (blood work) drawn today and your tests are completely normal, you will receive your results only by: Marland Kitchen MyChart Message (if you have MyChart) OR . A paper copy in the mail If you have any lab test that is abnormal or we need to change your treatment, we will call you to review the results.  Testing/Procedures: You had an EKG today. It showed normal sinus rhythm. This is a good result!  Follow-Up: At [facility], you and your health needs are our priority.  As part of our continuing mission to provide you with exceptional heart care, we have created designated Provider Care Teams.  These Care Teams include your primary Cardiologist (physician) and Advanced Practice Providers (APPs -  Physician Assistants and Nurse Practitioners) who all work together to provide you with the care you need, when you need it.  Your next appointment:   1 year(s)  The format for your next appointment:   In Person  Provider:    You may see Ida Rogue, MD or one of the following Advanced Practice Providers on your designated Care Team:    Murray Hodgkins, NP  Christell Faith, PA-C  Marrianne Mood, PA-C  Other Instructions  Recommend you follow up with your primary care regarding a medication for anxiety. May be good to discuss referral to therapy with your primary care provider if you are interested.  Look up Youtube videos for 'abdominal strengthening for beginners' or 'lower back yoga'.    Living With Anxiety  After being diagnosed with an anxiety disorder, you may be relieved to know why you have felt or behaved a certain way. It is natural to also feel overwhelmed about the treatment ahead and what it will mean for your life. With care and support, you can manage this condition and  recover from it. How to cope with anxiety Dealing with stress Stress is your body's reaction to life changes and events, both good and bad. Stress can last just a few hours or it can be ongoing. Stress can play a major role in anxiety, so it is important to learn both how to cope with stress and how to think about it differently. Talk with your health care provider or a counselor to learn more about stress reduction. He or she may suggest some stress reduction techniques, such as:  Music therapy. This can include creating or listening to music that you enjoy and that inspires you.  Mindfulness-based meditation. This involves being aware of your normal breaths, rather than trying to control your breathing. It can be done while sitting or walking.  Centering prayer. This is a kind of meditation that involves focusing on a word, phrase, or sacred image that is meaningful to you and that brings you peace.  Deep breathing. To do this, expand your stomach and inhale slowly through your nose. Hold your breath for 3-5 seconds. Then exhale slowly, allowing your stomach muscles to relax.  Self-talk. This is a skill where you identify thought patterns that lead to anxiety reactions and correct those thoughts.  Muscle relaxation. This involves tensing muscles then relaxing them. Choose a stress reduction technique that fits your lifestyle and personality. Stress reduction techniques take time and practice. Set aside 5-15 minutes a day to do them. Therapists can offer training in  these techniques. The training may be covered by some insurance plans. Other things you can do to manage stress include:  Keeping a stress diary. This can help you learn what triggers your stress and ways to control your response.  Thinking about how you respond to certain situations. You may not be able to control everything, but you can control your reaction.  Making time for activities that help you relax, and not feeling  guilty about spending your time in this way. Therapy combined with coping and stress-reduction skills provides the best chance for successful treatment. Medicines Medicines can help ease symptoms. Medicines for anxiety include:  Anti-anxiety drugs.  Antidepressants.  Beta-blockers. Medicines may be used as the main treatment for anxiety disorder, along with therapy, or if other treatments are not working. Medicines should be prescribed by a health care provider. Relationships Relationships can play a big part in helping you recover. Try to spend more time connecting with trusted friends and family members. Consider going to couples counseling, taking family education classes, or going to family therapy. Therapy can help you and others better understand the condition. How to recognize changes in your condition Everyone has a different response to treatment for anxiety. Recovery from anxiety happens when symptoms decrease and stop interfering with your daily activities at home or work. This may mean that you will start to:  Have better concentration and focus.  Sleep better.  Be less irritable.  Have more energy.  Have improved memory. It is important to recognize when your condition is getting worse. Contact your health care provider if your symptoms interfere with home or work and you do not feel like your condition is improving. Where to find help and support: You can get help and support from these sources:  Self-help groups.  Online and [employer].  A trusted spiritual leader.  Couples counseling.  Family education classes.  Family therapy. Follow these instructions at home:  Eat a healthy diet that includes plenty of vegetables, fruits, whole grains, low-fat dairy products, and lean protein. Do not eat a lot of foods that are high in solid fats, added sugars, or salt.  Exercise. Most adults should do the following: ? Exercise for at least 150 minutes each  week. The exercise should increase your heart rate and make you sweat (moderate-intensity exercise). ? Strengthening exercises at least twice a week.  Cut down on caffeine, tobacco, alcohol, and other potentially harmful substances.  Get the right amount and quality of sleep. Most adults need 7-9 hours of sleep each night.  Make choices that simplify your life.  Take over-the-counter and prescription medicines only as told by your health care provider.  Avoid caffeine, alcohol, and certain over-the-counter cold medicines. These may make you feel worse. Ask your pharmacist which medicines to avoid.  Keep all follow-up visits as told by your health care provider. This is important. Questions to ask your health care provider  Would I benefit from therapy?  How often should I follow up with a health care provider?  How long do I need to take medicine?  Are there any long-term side effects of my medicine?  Are there any alternatives to taking medicine? Contact a health care provider if:  You have a hard time staying focused or finishing daily tasks.  You spend many hours a day feeling worried about everyday life.  You become exhausted by worry.  You start to have headaches, feel tense, or have nausea.  You urinate more than normal.  You have diarrhea. Get help right away if:  You have a racing heart and shortness of breath.  You have thoughts of hurting yourself or others. If you ever feel like you may hurt yourself or others, or have thoughts about taking your own life, get help right away. You can go to your nearest emergency department or call:  Your local emergency services (911 in the [place]  A suicide crisis helpline, such as the National Suicide Prevention Lifeline at (509)047-76761-(786)090-2485. This is open 24-hours a day. Summary  Taking steps to deal with stress can help calm you.  Medicines cannot cure anxiety disorders, but they can help ease symptoms.  Family,  friends, and partners can play a big part in helping you recover from an anxiety disorder. This information is not intended to replace advice given to you by your health care provider. Make sure you discuss any questions you have with your health care provider. Document Released: 12/15/2015 Document Revised: 12/02/2016 Document Reviewed: 12/15/2015 Elsevier Patient Education  2020 [employer]

## 2019-01-01 NOTE — Progress Notes (Signed)
Office Visit    Patient Name: Philip Stephens Date of Encounter: 01/01/2019  Primary Care Provider:  Burnard Hawthorne, FNP Primary Cardiologist:  Ida Rogue, MD Electrophysiologist:  None   Chief Complaint    Philip Stephens is a 43 y.o. male with a hx of anxiety, chronic back pain, tobacco abuse, HLD  presents today for chest discomfort after prednisone.   Past Medical History    Past Medical History:  Diagnosis Date  . Arthritis   . Hypertension    History reviewed. No pertinent surgical history.  Allergies  Allergies  Allergen Reactions  . Penicillins Swelling    History of Present Illness    Philip Stephens is a 43 y.o. male with a hx of anxiety, chronic back pain, tobacco abuse, HLD last seen by Dr. Rockey Situ 11/14/18. He works in [employer]. Has three children.   He was initially evaluated for chest pain 07/2018. Per his report 11/2017 opening a door when he felt a "pop" and hurt his chest on the right at ED it "checked out okay". Previously seen by Dr. Terrence Dupont and started on Metoprolol for tachycardia. Coronary calcium score 11/2018 of 3. This was 78th percentile for age and sex matched control. Was previously recommended for cholesterol lowering medications for total cholesterol of 250, but requested to recheck level prior to starting therapy.  He started PT 09/2018 for lower back pain that has been able to continue due to time constraints.  He was diagnosed with a stomach ulcer last year. He started back taking his Carafate as he is recently had more symptoms.  He also takes Nexium.  He had some chest pain with taking prednisone.  It resolved after stopping prednisone.  He took a Flexeril at the time of the event.  The chest pain was across the bottom of his chest.    EKGs/Labs/Other Studies Reviewed:   The following studies were reviewed today:  CT Calcium scoring 11/05/18 FINDINGS: Non-cardiac: See separate report from [facility]  [facility]. Ascending Aorta: Normal Caliber.  No Calcifications. Pericardium: Normal Coronary arteries: Normal coronary origins. Mild calcifications in the RCA.   IMPRESSION: Coronary calcium score of 3. This was 78th percentile for age and sex matched control.    EKG:  EKG is ordered today.  The ekg ordered today demonstrates SR 95 bpm with PR 104.  Recent Labs: 09/17/2018: ALT 11; BUN 11; Creatinine, Ser 0.99; Hemoglobin 13.2; Magnesium 1.9; Platelets 255.0; Potassium 3.9; Sodium 141; TSH 1.21  Recent Lipid Panel    Component Value Date/Time   CHOL 247 (H) 04/19/2018 1021   TRIG 240.0 (H) 04/19/2018 1021   HDL 44.90 04/19/2018 1021   CHOLHDL 6 04/19/2018 1021   VLDL 48.0 (H) 04/19/2018 1021   LDLDIRECT 150.0 04/19/2018 1021    Home Medications   Current Meds  Medication Sig  . cyclobenzaprine (FLEXERIL) 10 MG tablet Take 1 tablet (10 mg total) by mouth 3 (three) times daily as needed for muscle spasms.  Marland Kitchen esomeprazole (NEXIUM) 10 MG packet 10 mg daily at 12 noon. Take 1 tablets by mouth every morning and 2 tablets by mouth every afternoon.  . metoprolol succinate (TOPROL-XL) 25 MG 24 hr tablet Take 1 tablet by mouth daily.  . pregabalin (LYRICA) 150 MG capsule Take 1 capsule (150 mg total) by mouth 3 (three) times daily.  . sildenafil (VIAGRA) 50 MG tablet TAKE 1/2 TABLET BY MOUTH DAILY AS NEEDED FOR ERECTILE DYSFUNCTION (TAKE 30 MINUTES TO 4 HOURS BEFORE SEXUAL  ACTIVITY)  . traMADol (ULTRAM) 50 MG tablet TAKE 2 TABLETS BY MOUTH EVERY 12 HOURS AS NEEDED FOR PAIN    Review of Systems      Review of Systems  Constitution: Negative for chills, fever and malaise/fatigue.  Cardiovascular: Positive for chest pain. Negative for dyspnea on exertion, leg swelling, near-syncope, orthopnea, palpitations and syncope.  Respiratory: Negative for cough, shortness of breath and wheezing.   Musculoskeletal: Positive for back pain (chronic).  Gastrointestinal: Negative for nausea and  vomiting.  Neurological: Negative for dizziness, light-headedness and weakness.  Psychiatric/Behavioral: The patient is nervous/anxious.    All other systems reviewed and are otherwise negative except as noted above.  Physical Exam    VS:  BP 136/74 (BP Location: Left Arm, Patient Position: Sitting, Cuff Size: Normal)   Pulse 95   Ht 6\' 2"  (1.88 m)   Wt 168 lb (76.2 kg)   SpO2 98%   BMI 21.57 kg/m  , BMI Body mass index is 21.57 kg/m. GEN: Well nourished, well developed, in no acute distress. HEENT: normal. Neck: Supple, no JVD, carotid bruits, or masses. Cardiac: RRR, no murmurs, rubs, or gallops. No clubbing, cyanosis, edema.  Radials/PT 2+ and equal bilaterally.  Respiratory:  Respirations regular and unlabored, clear to auscultation bilaterally. GI: Soft, nontender, nondistended MS: No deformity or atrophy. Skin: Warm and dry, no rash. Neuro:  Strength and sensation are intact. Psych: Normal affect.  Accessory Clinical Findings    ECG personally reviewed by me today - SR 95 bpm with short PR 104 - no acute ST/T wave changes.  Assessment & Plan    1. R-sided chest pain - Chest pain after taking prednisone which was EKG today SR with short PR, no acute ST/T wave changes. Pain resolved with PRN Flexeril. No chest pain today. Ca score in November of 3. Not cardiac in origin. Likely side effect of prednisone. No indication for ischemic evaluation.  2. Anxiety - Likely contributory to his symptoms. Encouraged him to follow up with his PCP regarding anxiolytic therapy which has been previously recommended to him. Recommended he discuss referral for therapy for anxiety with his PCP.  3. HLD - Upcoming lipid panel with his PCP. Not presently on lipid lowering therapy. Would recommend goal of LDL <100. 4. Chronic back pain - Not participating in PT due to time constraints.  Encouraged him to do PT exercises at home. 5. GERD -presently takes Nexium.  Tells me he has previously been  diagnosed with an ulcer that was caused by anxiety but never had an EGD done.  Encouraged him to continue his Nexium.  Likely contributory to his discomfort he is he is tender to palpation in epigastric area. 6. Tachycardia - Well controlled on ToprolXL.   Disposition: Follow up in 1 year(s) with Dr. December or APP.   Mariah Milling, NP 01/01/2019, 2:17 PM

## 2019-01-04 ENCOUNTER — Other Ambulatory Visit: Payer: Self-pay | Admitting: Internal Medicine

## 2019-01-04 DIAGNOSIS — M545 Low back pain, unspecified: Secondary | ICD-10-CM

## 2019-01-04 DIAGNOSIS — G8929 Other chronic pain: Secondary | ICD-10-CM

## 2019-01-06 ENCOUNTER — Encounter: Payer: Self-pay | Admitting: Family

## 2019-01-07 NOTE — Telephone Encounter (Signed)
I looked up patient on [place] Controlled Substances Reporting System and saw no activity that raised concern of inappropriate use.   

## 2019-01-08 ENCOUNTER — Telehealth: Payer: Self-pay

## 2019-01-08 NOTE — Telephone Encounter (Signed)
I called and spoke in detail with patient. He stated that he was feeling better today, but pain had actually started a few days after talking with you last. He was seeing Dr. Yves Dill & he was on second round of prednisone for his neck/bain pain. He feels like the pain in his chest is somehow related to his neck. When he has his neck pain his arms also go numb especially at night. I asked if he had called Dr. Margaretann Loveless office to let them know what was going on & how he was feeling. Pt stated that he had not. I asked that he do so. I advised if in pain that he go to UC due to no appointments available here. Pt refused UC at this time.

## 2019-01-08 NOTE — Telephone Encounter (Signed)
Noted. Agree with advice.  

## 2019-01-10 ENCOUNTER — Telehealth: Payer: Self-pay

## 2019-01-10 NOTE — Telephone Encounter (Signed)
-----   Message from Allegra Grana, FNP sent at 01/10/2019  9:59 AM EST ----- Regarding: Call pt Thanks for booking GI appt for Monday  Please however advise patient the following as to when to seek medical attention prior since I'm not in office :  If abdominal pain is severe or he starts to feels lightheaded , OR has Fever, vomiting , unable to stay hydrated , coffee ground like stools, he will have to ED.  Make sure he understands the dark stool could be bleeding from GI tract, which can come from bleeding ulcer. NO alcohol OR NSAIDs

## 2019-01-10 NOTE — Telephone Encounter (Signed)
I spoke with patient after reading mychart message. Due to no available appointments in our office & patient refusing UC I have made him an appointment with GI on Monday @ 2p. I have called patient & informed him of this. Pt will keep [place] updated. He was advised that if worsening abdominal pain, light headedness, fever or coffee ground-like stools to please proceed to ED. Pt verbalized understanding.

## 2019-01-14 ENCOUNTER — Other Ambulatory Visit: Payer: Self-pay | Admitting: Gastroenterology

## 2019-01-14 DIAGNOSIS — R1011 Right upper quadrant pain: Secondary | ICD-10-CM

## 2019-01-16 ENCOUNTER — Telehealth: Payer: Self-pay | Admitting: Family

## 2019-01-16 DIAGNOSIS — R079 Chest pain, unspecified: Secondary | ICD-10-CM

## 2019-01-16 NOTE — Telephone Encounter (Signed)
Patient scheduled for fasting labs first available that patient could make it to due to work. Scheduled 01/21/19.

## 2019-01-16 NOTE — Telephone Encounter (Signed)
Call pt - we need to schedule fasting labs.    States that feels fine right now.  Then 'all the sudden' will feel pain under my 'right boob' and shoulder. States is 'not pain' , it feel more numbness across chest and down both arms which is 'random'.     One week ago, took 4 ibuprofen a week ago, for back pain, and suddenly had pain under right boob. Taking the [facility] powder as well and it 'stays away, pain is improved.'  Saw Wylie Hail 3 days ago at Dr Horace Porteous office for black stool which has 'cleared up'. Ordered a chest xray. Awaiting on US gallbladder.Planning on EGD if [place] doesn't 'show anything.' On nexium.  Reviewed note from [facility] 09/18/18. Discussed ESI. Tried prednisone which he thinks also aggravated.   Discussed concern for nerve impingement and PUD. Advised continue to follow with Viann Shove and Tyler Deis. Pending  Labs.

## 2019-01-17 ENCOUNTER — Other Ambulatory Visit: Payer: Managed Care, Other (non HMO)

## 2019-01-21 ENCOUNTER — Other Ambulatory Visit (INDEPENDENT_AMBULATORY_CARE_PROVIDER_SITE_OTHER): Payer: Managed Care, Other (non HMO)

## 2019-01-21 ENCOUNTER — Other Ambulatory Visit: Payer: Self-pay

## 2019-01-21 DIAGNOSIS — R079 Chest pain, unspecified: Secondary | ICD-10-CM

## 2019-01-22 ENCOUNTER — Ambulatory Visit: Payer: Managed Care, Other (non HMO)

## 2019-01-22 LAB — CBC WITH DIFFERENTIAL/PLATELET
Basophils Absolute: 0.1 10*3/uL (ref 0.0–0.1)
Basophils Relative: 1.2 % (ref 0.0–3.0)
Eosinophils Absolute: 0.2 10*3/uL (ref 0.0–0.7)
Eosinophils Relative: 2.7 % (ref 0.0–5.0)
HCT: 40.4 % (ref 39.0–52.0)
Hemoglobin: 13.5 g/dL (ref 13.0–17.0)
Lymphocytes Relative: 37.6 % (ref 12.0–46.0)
Lymphs Abs: 3 10*3/uL (ref 0.7–4.0)
MCHC: 33.4 g/dL (ref 30.0–36.0)
MCV: 90.3 fl (ref 78.0–100.0)
Monocytes Absolute: 0.5 10*3/uL (ref 0.1–1.0)
Monocytes Relative: 6.9 % (ref 3.0–12.0)
Neutro Abs: 4.1 10*3/uL (ref 1.4–7.7)
Neutrophils Relative %: 51.6 % (ref 43.0–77.0)
Platelets: 285 10*3/uL (ref 150.0–400.0)
RBC: 4.47 Mil/uL (ref 4.22–5.81)
RDW: 12.8 % (ref 11.5–15.5)
WBC: 7.9 10*3/uL (ref 4.0–10.5)

## 2019-01-22 LAB — COMPREHENSIVE METABOLIC PANEL
ALT: 13 U/L (ref 0–53)
AST: 17 U/L (ref 0–37)
Albumin: 4.6 g/dL (ref 3.5–5.2)
Alkaline Phosphatase: 84 U/L (ref 39–117)
BUN: 15 mg/dL (ref 6–23)
CO2: 28 mEq/L (ref 19–32)
Calcium: 9.5 mg/dL (ref 8.4–10.5)
Chloride: 102 mEq/L (ref 96–112)
Creatinine, Ser: 1.06 mg/dL (ref 0.40–1.50)
GFR: 75.76 mL/min (ref >60.00)
Glucose, Bld: 76 mg/dL (ref 70–99)
Potassium: 4 mEq/L (ref 3.5–5.1)
Sodium: 140 mEq/L (ref 135–145)
Total Bilirubin: 0.2 mg/dL (ref 0.2–1.2)
Total Protein: 6.9 g/dL (ref 6.0–8.3)

## 2019-01-22 LAB — LIPID PANEL
Cholesterol: 242 mg/dL — ABNORMAL HIGH (ref 0–200)
HDL: 42 mg/dL (ref >39.00)
NonHDL: 200.45
Total CHOL/HDL Ratio: 6
Triglycerides: 367 mg/dL — ABNORMAL HIGH (ref 0.0–149.0)
VLDL: 73.4 mg/dL — ABNORMAL HIGH (ref 0.0–40.0)

## 2019-01-22 LAB — VITAMIN D 25 HYDROXY (VIT D DEFICIENCY, FRACTURES): VITD: 26.68 ng/mL — ABNORMAL LOW (ref 30.00–100.00)

## 2019-01-22 LAB — TSH: TSH: 2.05 u[IU]/mL (ref 0.35–4.50)

## 2019-01-22 LAB — LDL CHOLESTEROL, DIRECT: Direct LDL: 152 mg/dL

## 2019-01-22 LAB — HEMOGLOBIN A1C: Hgb A1c MFr Bld: 6.1 % (ref 4.6–6.5)

## 2019-01-23 ENCOUNTER — Other Ambulatory Visit: Payer: Self-pay | Admitting: Internal Medicine

## 2019-01-23 DIAGNOSIS — M545 Low back pain, unspecified: Secondary | ICD-10-CM

## 2019-01-23 DIAGNOSIS — G8929 Other chronic pain: Secondary | ICD-10-CM

## 2019-01-24 ENCOUNTER — Telehealth: Payer: Self-pay | Admitting: Lab

## 2019-01-24 ENCOUNTER — Encounter: Payer: Self-pay | Admitting: Family

## 2019-01-24 ENCOUNTER — Other Ambulatory Visit: Payer: Self-pay | Admitting: Lab

## 2019-01-24 DIAGNOSIS — M545 Low back pain, unspecified: Secondary | ICD-10-CM

## 2019-01-24 DIAGNOSIS — G8929 Other chronic pain: Secondary | ICD-10-CM

## 2019-01-24 NOTE — Telephone Encounter (Signed)
Error Duplicate Rx

## 2019-01-24 NOTE — Telephone Encounter (Signed)
THERE IS NO MESSAGE ATTACHED

## 2019-01-25 ENCOUNTER — Encounter: Payer: Self-pay | Admitting: Family

## 2019-01-25 ENCOUNTER — Other Ambulatory Visit: Payer: Self-pay

## 2019-01-25 DIAGNOSIS — G8929 Other chronic pain: Secondary | ICD-10-CM

## 2019-01-25 DIAGNOSIS — M545 Low back pain, unspecified: Secondary | ICD-10-CM

## 2019-01-25 MED ORDER — PREGABALIN 150 MG PO CAPS: 150.0000 mg | ORAL_CAPSULE | Freq: Three times a day (TID) | ORAL | 1 refills | Status: DC

## 2019-01-25 NOTE — Telephone Encounter (Signed)
I looked up patient on [place] Controlled Substances Reporting System and saw no activity that raised concern of inappropriate use.   

## 2019-01-25 NOTE — Telephone Encounter (Signed)
Error it was a Rx

## 2019-01-29 ENCOUNTER — Ambulatory Visit
Admission: RE | Admit: 2019-01-29 | Discharge: 2019-01-29 | Disposition: A | Discharge status: Home / Self Care | Payer: Managed Care, Other (non HMO) | Source: Ambulatory Visit | Attending: Gastroenterology | Admitting: Gastroenterology

## 2019-01-29 ENCOUNTER — Other Ambulatory Visit: Payer: Self-pay

## 2019-01-29 DIAGNOSIS — R1011 Right upper quadrant pain: Secondary | ICD-10-CM | POA: Diagnosis not present

## 2019-02-01 ENCOUNTER — Ambulatory Visit (INDEPENDENT_AMBULATORY_CARE_PROVIDER_SITE_OTHER): Payer: Managed Care, Other (non HMO) | Admitting: Family

## 2019-02-01 ENCOUNTER — Encounter: Payer: Self-pay | Admitting: Family

## 2019-02-01 ENCOUNTER — Other Ambulatory Visit: Payer: Self-pay

## 2019-02-01 VITALS — Ht 74.0 in | Wt 172.0 lb

## 2019-02-01 DIAGNOSIS — R079 Chest pain, unspecified: Secondary | ICD-10-CM

## 2019-02-01 DIAGNOSIS — R200 Anesthesia of skin: Secondary | ICD-10-CM

## 2019-02-01 DIAGNOSIS — R0782 Intercostal pain: Secondary | ICD-10-CM

## 2019-02-01 NOTE — Assessment & Plan Note (Addendum)
Patient describes "weird" sensation coming down his arms and occasional numbness.  He has a history of low back pain, working diagnosis of nerve impingement.   Pending MRI cervical and thoracic spine.  After we get his results, I will share with Dr. Yves Dill.

## 2019-02-01 NOTE — Progress Notes (Deleted)
   Subjective:    Patient ID: Philip Stephens, male    DOB: 1975/07/14, 44 y.o.   MRN: [id number]  CC: [name] is a 44 y.o. male who presents today for follow up.   HPI: HPI  HISTORY:  Past Medical History:  Diagnosis Date  . Arthritis   . Hypertension    No past surgical history on file. Family History  Problem Relation Age of Onset  . Hyperlipidemia Mother   . Hyperlipidemia Father   . Hearing loss Father   . Diabetes Father   . Arthritis Father     Allergies: Penicillins Current Outpatient Medications on File Prior to Visit  Medication Sig Dispense Refill  . cyclobenzaprine (FLEXERIL) 10 MG tablet Take 1 tablet (10 mg total) by mouth 3 (three) times daily as needed for muscle spasms. 30 tablet 0  . esomeprazole (NEXIUM) 10 MG packet 10 mg daily at 12 noon. Take 1 tablets by mouth every morning and 2 tablets by mouth every afternoon.    . metoprolol succinate (TOPROL-XL) 25 MG 24 hr tablet Take 1 tablet by mouth daily.    . pregabalin (LYRICA) 150 MG capsule Take 1 capsule (150 mg total) by mouth 3 (three) times daily. 90 capsule 1  . sildenafil (VIAGRA) 50 MG tablet TAKE 1/2 TABLET BY MOUTH DAILY AS NEEDED FOR ERECTILE DYSFUNCTION (TAKE 30 MINUTES TO 4 HOURS BEFORE SEXUAL ACTIVITY) 10 tablet 1  . traMADol (ULTRAM) 50 MG tablet TAKE TWO TABLETS BY MOUTH EVERY 12 HOURS AS NEEDED FOR PAIN 120 tablet 1   No current facility-administered medications on file prior to visit.    Social History   Tobacco Use  . Smoking status: Former Smoker    Quit date: 02/03/2018    Years since quitting: 0.9  . Smokeless tobacco: Former Neurosurgeon  . Tobacco comment: Does Velo. ( has nicotine , no tobacco).   Substance Use Topics  . Alcohol use: Not Currently  . Drug use: Never    Review of Systems    Objective:    There were no vitals taken for this visit. BP Readings from Last 3 Encounters:  01/01/19 136/74  09/25/18 110/80  08/01/18 126/78   Wt Readings from Last 3  Encounters:  01/01/19 168 lb (76.2 kg)  12/17/18 152 lb (68.9 kg)  09/25/18 158 lb (71.7 kg)    Physical Exam     Assessment & Plan:   Problem List Items Addressed This Visit    None       I am having [name] maintain his sildenafil, metoprolol succinate, esomeprazole, cyclobenzaprine, traMADol, and pregabalin.   No orders of the defined types were placed in this encounter.   Return precautions given.   Risks, benefits, and alternatives of the medications and treatment plan prescribed today were discussed, and patient expressed understanding.   Education regarding symptom management and diagnosis given to patient on AVS.  Continue to follow with Allegra Grana, FNP for routine health maintenance.   [name] and I agreed with plan.   [name], FNP

## 2019-02-01 NOTE — Assessment & Plan Note (Signed)
Patient politely declined coming in the office today, and preferred virtual visit which we did today.  He was very articulate about where his pain is;  primarily on the right side under the breast however sometimes it presents under both of the breasts.  It responds to tramadol, Lyrica and also CBD oil.  He does not have associated exertional chest pain, sob.  This pain is overall improved this past year.  Discussed pursuing a CT of the chest now that his ultrasound of gallbladder returned normal 3 days ago.   he does not appear to have typical GERD symptoms at this time although he has had these in the past.  At this time, working diagnosis is more musculoskeletal ( ?  Costochondritis) as I explained to patient.  I reminded him to stay vigilant as we still not have a firm diagnosis.  Advised again for him to stop using BC powder because at one time we had been quite concerned about peptic ulcer disease, GERD . Close follow-up

## 2019-02-01 NOTE — Progress Notes (Signed)
Virtual Visit via Video Note  I connected with@  on 02/01/19 at 12:00 PM EST by a video enabled telemedicine application and verified that I am speaking with the correct person using two identifiers.  Location patient: home Location provider:work  Persons participating in the virtual visit: patient, provider  I discussed the limitations of evaluation and management by telemedicine and the availability of in person appointments. The patient expressed understanding and agreed to proceed.   HPI: Feels weird 'muscle tense' under breast today, going on for a year. 'by far better'.  Typically not associated with eating. Has 'backed off nexium'.   'felt like Jayvyn yesterday'  Decided not to come in for in person visit, decided to do virtual visit.   Has 'weird feeling' under breasts and then down both arms which is is chief concern. Primarily under right breast is were he feels this sensation of pain. Can notice at time when when he takes a deep breath.  No exertional chest pain, shortness of breath.   He has also noticed that his posterior neck has started to hurt which is new for him.  Occasionally he has numbness coming down his arms. No weakness.    Started CDB oil and symptom has completely resolved. He is excited about this.  Tramadol and lyrica very helpful with pain as wel.   No weight loss, epigastric pain, burping, belching.   H/o of low back pain   Works at [employer]. Will lift heavy stuff, walking, going up stairs.   Takes BC Powder once per day.  ROS: See pertinent positives and negatives per HPI.  Past Medical History:  Diagnosis Date  . Arthritis   . Hypertension     History reviewed. No pertinent surgical history.  Family History  Problem Relation Age of Onset  . Hyperlipidemia Mother   . Hyperlipidemia Father   . Hearing loss Father   . Diabetes Father   . Arthritis Father     SOCIAL HX: former smoker   Current Outpatient Medications:  .  cyclobenzaprine  (FLEXERIL) 10 MG tablet, Take 1 tablet (10 mg total) by mouth 3 (three) times daily as needed for muscle spasms., Disp: 30 tablet, Rfl: 0 .  esomeprazole (NEXIUM) 10 MG packet, 10 mg daily at 12 noon. Take 1 tablets by mouth every morning and 2 tablets by mouth every afternoon., Disp: , Rfl:  .  metoprolol succinate (TOPROL-XL) 25 MG 24 hr tablet, Take 1 tablet by mouth daily., Disp: , Rfl:  .  pregabalin (LYRICA) 150 MG capsule, Take 1 capsule (150 mg total) by mouth 3 (three) times daily., Disp: 90 capsule, Rfl: 1 .  sildenafil (VIAGRA) 50 MG tablet, TAKE 1/2 TABLET BY MOUTH DAILY AS NEEDED FOR ERECTILE DYSFUNCTION (TAKE 30 MINUTES TO 4 HOURS BEFORE SEXUAL ACTIVITY), Disp: 10 tablet, Rfl: 1 .  traMADol (ULTRAM) 50 MG tablet, TAKE TWO TABLETS BY MOUTH EVERY 12 HOURS AS NEEDED FOR PAIN, Disp: 120 tablet, Rfl: 1  EXAM:  VITALS per patient if applicable:  GENERAL: alert, oriented, appears well and in no acute distress  HEENT: atraumatic, conjunttiva clear, no obvious abnormalities on inspection of external nose and ears  NECK: normal movements of the head and neck  LUNGS: on inspection no signs of respiratory distress, breathing rate appears normal, no obvious gross SOB, gasping or wheezing  CV: no obvious cyanosis  MS: moves all visible extremities without noticeable abnormality  PSYCH/NEURO: pleasant and cooperative, no obvious depression or anxiety, speech and thought processing grossly  intact  ASSESSMENT AND PLAN:  Discussed the following assessment and plan:  Intercostal pain - Plan: CT Chest Wo Contrast, MR Thoracic Spine Wo Contrast  Numbness in both hands - Plan: MR Cervical Spine Wo Contrast  Right-sided chest pain  Chest pain, unspecified type - Plan: MR Thoracic Spine Wo Contrast Problem List Items Addressed This Visit      Other   Numbness in both hands    Patient describes "weird" sensation coming down his arms and occasional numbness.  He has a history of low back  pain, working diagnosis of nerve impingement.   Pending MRI cervical and thoracic spine.  After we get his results, I will share with Dr. Yves Dill.       Relevant Orders   MR Cervical Spine Wo Contrast   Right-sided chest pain    Patient politely declined coming in the office today, and preferred virtual visit which we did today.  He was very articulate about where his pain is;  primarily on the right side under the breast however sometimes it presents under both of the breasts.  It responds to tramadol, Lyrica and also CBD oil.  He does not have associated exertional chest pain, sob.  This pain is overall improved this past year.  Discussed pursuing a CT of the chest now that his ultrasound of gallbladder returned normal 3 days ago.   he does not appear to have typical GERD symptoms at this time although he has had these in the past.  At this time, working diagnosis is more musculoskeletal ( ?  Costochondritis) as I explained to patient.  I reminded him to stay vigilant as we still not have a firm diagnosis.  Advised again for him to stop using BC powder because at one time we had been quite concerned about peptic ulcer disease, GERD . Close follow-up       Other Visit Diagnoses    Intercostal pain    -  Primary   Relevant Orders   CT Chest Wo Contrast   MR Thoracic Spine Wo Contrast   Chest pain, unspecified type       Relevant Orders   MR Thoracic Spine Wo Contrast      -we discussed possible serious and likely etiologies, options for evaluation and workup, limitations of telemedicine visit vs in person visit, treatment, treatment risks and precautions. Pt prefers to treat via telemedicine empirically rather then risking or undertaking an in person visit at this moment. Patient agrees to seek prompt in person care if worsening, new symptoms arise, or if is not improving with treatment.   I discussed the assessment and treatment plan with the patient. The patient was provided an opportunity  to ask questions and all were answered. The patient agreed with the plan and demonstrated an understanding of the instructions.   The patient was advised to call back or seek an in-person evaluation if the symptoms worsen or if the condition fails to improve as anticipated.   [name], FNP

## 2019-02-08 ENCOUNTER — Other Ambulatory Visit: Payer: Self-pay | Admitting: Family

## 2019-02-08 ENCOUNTER — Encounter: Payer: Self-pay | Admitting: Family

## 2019-02-08 DIAGNOSIS — R079 Chest pain, unspecified: Secondary | ICD-10-CM

## 2019-02-11 ENCOUNTER — Encounter: Payer: Self-pay | Admitting: Family

## 2019-02-12 ENCOUNTER — Encounter: Payer: Self-pay | Admitting: Family

## 2019-02-12 ENCOUNTER — Telehealth: Payer: Self-pay | Admitting: Family

## 2019-02-12 DIAGNOSIS — R1013 Epigastric pain: Secondary | ICD-10-CM

## 2019-02-12 NOTE — Telephone Encounter (Signed)
Spoke with pt and he stated that he is going to have the d-dimer done tomorrow. Pt also stated that he does not want to do the PFT right now.

## 2019-02-12 NOTE — Telephone Encounter (Signed)
LMTCB

## 2019-02-12 NOTE — Telephone Encounter (Signed)
I called pt regarding pt going to [facility] to get D Dimer done. A also advised that Claris Che will be in contact with pt. Pt understood.

## 2019-02-12 NOTE — Telephone Encounter (Signed)
Pt was returning your call.

## 2019-02-12 NOTE — Telephone Encounter (Signed)
Call pt  Advise him that I had peer to peer with his insurance this morning in regards to his images We have MRI thoracic approved... that is it.   They have denied CT chest and MRI c spine at this time.   Please advise that to have chest imaging, we can two things. They advised he needs pulmonary function testing- I can set this up with this pulmonology which is essentially a referral that I can place.   We can also get a lab called a d dimer which can be elevated if a patient were to have a blood clot. I advise him to go at [facility] TODAY and have this done. If this is elevated, he would be able to have stat CTA.    My notes:  Wells score 0. Low risk PE.   Will add on d dimer.

## 2019-02-13 NOTE — Telephone Encounter (Signed)
noted 

## 2019-02-14 ENCOUNTER — Other Ambulatory Visit
Admission: RE | Admit: 2019-02-14 | Discharge: 2019-02-14 | Disposition: A | Discharge status: Home / Self Care | Payer: Managed Care, Other (non HMO) | Attending: Family | Admitting: Family

## 2019-02-14 DIAGNOSIS — R1013 Epigastric pain: Secondary | ICD-10-CM | POA: Diagnosis not present

## 2019-02-14 LAB — FIBRIN DERIVATIVES D-DIMER (ARMC ONLY): Fibrin derivatives D-dimer (ARMC): 113.71 ng/mL (FEU) (ref 0.00–499.00)

## 2019-02-18 ENCOUNTER — Other Ambulatory Visit: Payer: Self-pay | Admitting: Family

## 2019-02-18 DIAGNOSIS — R1013 Epigastric pain: Secondary | ICD-10-CM

## 2019-02-19 ENCOUNTER — Other Ambulatory Visit: Payer: Self-pay | Admitting: Gastroenterology

## 2019-02-19 DIAGNOSIS — R1011 Right upper quadrant pain: Secondary | ICD-10-CM

## 2019-02-19 DIAGNOSIS — R1013 Epigastric pain: Secondary | ICD-10-CM

## 2019-02-22 ENCOUNTER — Ambulatory Visit: Admission: RE | Admit: 2019-02-22 | Payer: Managed Care, Other (non HMO) | Source: Ambulatory Visit

## 2019-02-25 ENCOUNTER — Ambulatory Visit
Admission: RE | Admit: 2019-02-25 | Discharge: 2019-02-25 | Disposition: A | Discharge status: Home / Self Care | Payer: Managed Care, Other (non HMO) | Source: Ambulatory Visit | Attending: Gastroenterology | Admitting: Gastroenterology

## 2019-02-25 ENCOUNTER — Ambulatory Visit
Admission: RE | Admit: 2019-02-25 | Discharge: 2019-02-25 | Disposition: A | Discharge status: Home / Self Care | Payer: Managed Care, Other (non HMO) | Source: Ambulatory Visit | Attending: Family | Admitting: Family

## 2019-02-25 ENCOUNTER — Other Ambulatory Visit: Payer: Self-pay

## 2019-02-25 DIAGNOSIS — R200 Anesthesia of skin: Secondary | ICD-10-CM | POA: Insufficient documentation

## 2019-02-25 DIAGNOSIS — R1011 Right upper quadrant pain: Secondary | ICD-10-CM | POA: Diagnosis present

## 2019-02-25 DIAGNOSIS — R1013 Epigastric pain: Secondary | ICD-10-CM

## 2019-02-25 DIAGNOSIS — R079 Chest pain, unspecified: Secondary | ICD-10-CM | POA: Insufficient documentation

## 2019-02-25 DIAGNOSIS — R0782 Intercostal pain: Secondary | ICD-10-CM | POA: Insufficient documentation

## 2019-02-28 ENCOUNTER — Encounter: Payer: Self-pay | Admitting: Family

## 2019-03-04 ENCOUNTER — Other Ambulatory Visit: Payer: Self-pay | Admitting: Family

## 2019-03-04 DIAGNOSIS — M545 Low back pain, unspecified: Secondary | ICD-10-CM

## 2019-03-04 DIAGNOSIS — G8929 Other chronic pain: Secondary | ICD-10-CM

## 2019-03-04 MED ORDER — TRAMADOL HCL 50 MG PO TABS: ORAL_TABLET | ORAL | 1 refills | Status: DC

## 2019-03-11 ENCOUNTER — Encounter: Payer: Self-pay | Admitting: Family

## 2019-03-11 ENCOUNTER — Ambulatory Visit (INDEPENDENT_AMBULATORY_CARE_PROVIDER_SITE_OTHER): Payer: Managed Care, Other (non HMO) | Admitting: Family

## 2019-03-11 ENCOUNTER — Other Ambulatory Visit: Payer: Self-pay | Admitting: Family

## 2019-03-11 ENCOUNTER — Other Ambulatory Visit: Payer: Self-pay

## 2019-03-11 DIAGNOSIS — R079 Chest pain, unspecified: Secondary | ICD-10-CM

## 2019-03-11 DIAGNOSIS — R0782 Intercostal pain: Secondary | ICD-10-CM

## 2019-03-11 NOTE — Assessment & Plan Note (Addendum)
Chronic, comes and goes for past year. Understandably patient is frustrated and wants to know reason with his pain.  Agreed we need to start looking at this another vantage point.  I no longer think that GERD, PUD most likely reason for symptoms.  We did discuss chronic back pain and recent MRIs of thoracic and cervical spine causing a nerve entrapment,  although no significant stenosis was seen in images  I did discuss with him one differential that I was considering somewhat, Anterior cutaneous nerve entrapment syndrome, however very much advised I was not familiar in making this diagnosis but there were some features that I found interesting and similar to his current presentation.  I also consider costochondritis on the differential however I have not experienced it for go to go on as long as his pain has.   I am also concerned in regards to the shortness of breath as a feature that occurs when pain is present.  No respiratory distress or shortness of breath while walking the patient today in the office however, adamantly agreed we need to have chest imaging.  I ordered a CTA today. He is a former smoker.  We will also consider pulmonology referral.  For now, I would advise continued close vigilance, trial of over-the-counter capsaicin for possible nerve component of pain.  He will also try Elavil as prescribed by [facility] although he will take half to one fourth a tablet, as he had felt sleepy when he took the 10 mg.

## 2019-03-11 NOTE — Progress Notes (Signed)
Subjective:    Patient ID: Philip Stephens, male    DOB: 1975-06-05, 44 y.o.   MRN: [id number]  CC: Philip Stephens is a 44 y.o. male who presents today for follow up.   HPI: Right anterior chest pain for a year, comes and goes . Last 4 weeks had felt well until last week. Describes as someone inside 'my chest' , like a 'hollow feeling.' Pain feels good first thing in the morning. When gets day going, pain will start to hurt. Cannot sleep on right side or roll over quick due to the pain.  Radiates to the right scapula.  This morning , he tried to work under a car and then pain will start. When pain is  Has noticed that when neck gives him problems at night, notices that his right chest will start at night. Pain improved with THC topical and heating pad. Typically sleeps with heating pad on chest.  Pain with deep breath on right side. Will feel sob with breath.  Pain with pressing right side of chest. Changed from tempuritic to sleep number bed and thinks may be part  No cough, wheezing. Tried chircopracotor 6 months ago and neck pain has been worse.   No longer drinking coffee.   HA at crown of head x 3 weeks, 'pressure.'  No vision changes.  No dyspepsia, belching, burping. Tried amitriptyline ( elavil) 10mg , felt tired the next day.   Former smoker    Chest Xray 01/14/2019  HISTORY:  Past Medical History:  Diagnosis Date  . Arthritis   . Hypertension    History reviewed. No pertinent surgical history. Family History  Problem Relation Age of Onset  . Hyperlipidemia Mother   . Hyperlipidemia Father   . Hearing loss Father   . Diabetes Father   . Arthritis Father     Allergies: Penicillins Current Outpatient Medications on File Prior to Visit  Medication Sig Dispense Refill  . cyclobenzaprine (FLEXERIL) 10 MG tablet Take 1 tablet (10 mg total) by mouth 3 (three) times daily as needed for muscle spasms. 30 tablet 0  . esomeprazole (NEXIUM) 10 MG packet 10 mg daily at 12  noon. Take 1 tablets by mouth every morning and 2 tablets by mouth every afternoon.    . metoprolol succinate (TOPROL-XL) 25 MG 24 hr tablet Take 1 tablet by mouth daily.    . pregabalin (LYRICA) 150 MG capsule Take 1 capsule (150 mg total) by mouth 3 (three) times daily. 90 capsule 1  . sildenafil (VIAGRA) 50 MG tablet TAKE 1/2 TABLET BY MOUTH DAILY AS NEEDED FOR ERECTILE DYSFUNCTION (TAKE 30 MINUTES TO 4 HOURS BEFORE SEXUAL ACTIVITY) 10 tablet 1  . traMADol (ULTRAM) 50 MG tablet TAKE TWO TABLETS BY MOUTH EVERY 12 HOURS AS NEEDED FOR PAIN 120 tablet 1   No current facility-administered medications on file prior to visit.    Social History   Tobacco Use  . Smoking status: Former Smoker    Quit date: 02/03/2018    Years since quitting: 1.0  . Smokeless tobacco: Former Systems developer  . Tobacco comment: Does Velo. ( has nicotine , no tobacco).   Substance Use Topics  . Alcohol use: Not Currently  . Drug use: Never    Review of Systems  Constitutional: Negative for chills and fever.  Respiratory: Positive for shortness of breath. Negative for cough, chest tightness and wheezing.   Cardiovascular: Positive for chest pain (right sided). Negative for palpitations.  Gastrointestinal: Negative for nausea and  vomiting.  Musculoskeletal: Positive for back pain and neck stiffness.      Objective:    BP 118/78   Pulse 81   Temp (!) 97.3 F (36.3 C)   Ht 6\' 2"  (1.88 m)   Wt 166 lb 12.8 oz (75.7 kg)   SpO2 98%   BMI 21.42 kg/m  BP Readings from Last 3 Encounters:  03/11/19 118/78  01/01/19 136/74  09/25/18 110/80   Wt Readings from Last 3 Encounters:  03/11/19 166 lb 12.8 oz (75.7 kg)  02/01/19 172 lb (78 kg)  01/01/19 168 lb (76.2 kg)    Physical Exam Vitals reviewed.  Constitutional:      Appearance: He is well-developed.  Cardiovascular:     Rate and Rhythm: Regular rhythm.     Heart sounds: Normal heart sounds.  Pulmonary:     Effort: Pulmonary effort is normal. No respiratory  distress.     Breath sounds: Normal breath sounds. No wheezing, rhonchi or rales.  Chest:     Chest wall: No tenderness.     Comments: No pain elicited with movement of the right shoulder.  No tenderness with palpation on exam. Skin:    General: Skin is warm and dry.  Neurological:     Mental Status: He is alert.  Psychiatric:        Speech: Speech normal.        Behavior: Behavior normal.        Assessment & Plan:   Problem List Items Addressed This Visit      Other   Right-sided chest pain    Chronic, comes and goes for past year. Understandably patient is frustrated and wants to know reason with his pain.  Agreed we need to start looking at this another vantage point.  I no longer think that GERD, PUD most likely reason for symptoms.  We did discuss chronic back pain and recent MRIs of thoracic and cervical spine causing a nerve entrapment,  although no significant stenosis was seen in images  I did discuss with him one differential that I was considering somewhat, Anterior cutaneous nerve entrapment syndrome, however very much advised I was not familiar in making this diagnosis but there were some features that I found interesting and similar to his current presentation.  I also consider costochondritis on the differential however I have not experienced it for go to go on as long as his pain has.   I am also concerned in regards to the shortness of breath as a feature that occurs when pain is present.  No respiratory distress or shortness of breath while walking the patient today in the office however, adamantly agreed we need to have chest imaging.  I ordered a CTA today. He is a former smoker.  We will also consider pulmonology referral.  For now, I would advise continued close vigilance, trial of over-the-counter capsaicin for possible nerve component of pain.  He will also try Elavil as prescribed by 01/03/19 GI although he will take half to one fourth a tablet, as he had felt  sleepy when he took the 10 mg.      Relevant Orders   CT Angio Chest W/Cm &/Or Wo Cm    Other Visit Diagnoses    Intercostal pain       Relevant Orders   CT Angio Chest W/Cm &/Or Wo Cm       I am having Kaemon C. Crowson maintain his sildenafil, metoprolol succinate, esomeprazole, cyclobenzaprine, pregabalin, and  traMADol.   No orders of the defined types were placed in this encounter.   Return precautions given.   Risks, benefits, and alternatives of the medications and treatment plan prescribed today were discussed, and patient expressed understanding.   Education regarding symptom management and diagnosis given to patient on AVS.  Continue to follow with Allegra Grana, FNP for routine health maintenance.   [name] and I agreed with plan.   [name], FNP

## 2019-03-11 NOTE — Patient Instructions (Signed)
Trial OTC Capsaicin - this is for nerve pain  We will pursue CT of the chest, if not approved, I will refer you to pulmonology .

## 2019-03-12 NOTE — Telephone Encounter (Signed)
Patient would like to speak to Maralyn Sago. He has information that is needed to process CT scan.

## 2019-03-14 ENCOUNTER — Other Ambulatory Visit: Payer: Self-pay | Admitting: Gastroenterology

## 2019-03-14 DIAGNOSIS — R1013 Epigastric pain: Secondary | ICD-10-CM

## 2019-03-14 DIAGNOSIS — R0789 Other chest pain: Secondary | ICD-10-CM

## 2019-03-14 DIAGNOSIS — R1011 Right upper quadrant pain: Secondary | ICD-10-CM

## 2019-03-18 ENCOUNTER — Ambulatory Visit: Payer: Managed Care, Other (non HMO) | Admitting: Family

## 2019-03-19 ENCOUNTER — Ambulatory Visit: Payer: Managed Care, Other (non HMO) | Admitting: Family

## 2019-03-19 ENCOUNTER — Other Ambulatory Visit: Payer: Self-pay

## 2019-03-19 ENCOUNTER — Ambulatory Visit
Admission: RE | Admit: 2019-03-19 | Discharge: 2019-03-19 | Disposition: A | Discharge status: Home / Self Care | Payer: Managed Care, Other (non HMO) | Source: Ambulatory Visit | Attending: Family | Admitting: Family

## 2019-03-19 DIAGNOSIS — R0782 Intercostal pain: Secondary | ICD-10-CM | POA: Insufficient documentation

## 2019-03-19 DIAGNOSIS — R079 Chest pain, unspecified: Secondary | ICD-10-CM | POA: Diagnosis not present

## 2019-03-19 LAB — POCT I-STAT CREATININE: Creatinine, Ser: 1.1 mg/dL (ref 0.61–1.24)

## 2019-03-19 MED ORDER — IOHEXOL 350 MG/ML SOLN
75.0000 mL | Freq: Once | INTRAVENOUS | Status: AC | PRN
  Administered 2019-03-19: 75 mL via INTRAVENOUS

## 2019-03-20 ENCOUNTER — Telehealth: Payer: Self-pay | Admitting: Family

## 2019-03-20 DIAGNOSIS — G8929 Other chronic pain: Secondary | ICD-10-CM

## 2019-03-20 DIAGNOSIS — M545 Low back pain, unspecified: Secondary | ICD-10-CM

## 2019-03-20 MED ORDER — PREDNISONE 20 MG PO TABS: 20.0000 mg | ORAL_TABLET | Freq: Every day | ORAL | 0 refills | Status: DC

## 2019-03-20 NOTE — Telephone Encounter (Signed)
Spoke with patient  Having posterior neck pain today.  Feels pain in right chest is related to a nerve. Was given prednisone from dr Yves Dill office which was helpful and would like to take a low dose prednisone for a couple of days  Would like prednisone. He doesn't want to take NSAIDs. I have sent in prednisone 20mg  x 3 days.  Sees pulmonology in April He will call Chasnis today to make a follow up.  He will let me know how he is doing

## 2019-03-24 ENCOUNTER — Other Ambulatory Visit: Payer: Self-pay | Admitting: Family

## 2019-03-24 DIAGNOSIS — M545 Low back pain, unspecified: Secondary | ICD-10-CM

## 2019-03-24 DIAGNOSIS — G8929 Other chronic pain: Secondary | ICD-10-CM

## 2019-04-02 ENCOUNTER — Other Ambulatory Visit: Payer: Self-pay

## 2019-04-02 ENCOUNTER — Encounter: Payer: Self-pay | Admitting: Family

## 2019-04-02 ENCOUNTER — Telehealth: Payer: Self-pay | Admitting: Family Medicine

## 2019-04-02 ENCOUNTER — Encounter: Payer: Self-pay | Admitting: Family Medicine

## 2019-04-02 ENCOUNTER — Telehealth (INDEPENDENT_AMBULATORY_CARE_PROVIDER_SITE_OTHER): Payer: Managed Care, Other (non HMO) | Admitting: Family Medicine

## 2019-04-02 DIAGNOSIS — M542 Cervicalgia: Secondary | ICD-10-CM

## 2019-04-02 DIAGNOSIS — H9313 Tinnitus, bilateral: Secondary | ICD-10-CM

## 2019-04-02 DIAGNOSIS — G8929 Other chronic pain: Secondary | ICD-10-CM | POA: Diagnosis not present

## 2019-04-02 DIAGNOSIS — H9319 Tinnitus, unspecified ear: Secondary | ICD-10-CM | POA: Insufficient documentation

## 2019-04-02 DIAGNOSIS — R079 Chest pain, unspecified: Secondary | ICD-10-CM

## 2019-04-02 NOTE — Assessment & Plan Note (Addendum)
Chronic issue.  Extensive work-up has been unremarkable.  Potentially could be musculoskeletal.  I discussed with his PCP and we opted for referral to sports medicine in [place] for a second opinion.  He will also keep his appointment with pulmonology given the bulla on CT scan.  He will continue his chronic pain medications.

## 2019-04-02 NOTE — Telephone Encounter (Signed)
I spoke with patient & he stated that he still is having some chest discomfort, but no bad episodes like he has had. Currently he is having an issue with ringing in his ears. He said that it has progressively gotten worse over the last week & is very high pitched. He isn't sure if this is tinnitus or he if he's done damage from working so many years with our wearing earplugs? He also said that his neck was once again bothering him & was unsure if he needed to see maybe a chiropractor? He said that he bought a neck pillow to see if that would help, but it hasn't. I also asked if he had made follow-up to se Dr. Yves Dill & he said that he hadn't, but would after we hung up the phone.  I was able to schedule patient for a virtual today with Dr. Birdie Sons for a the tinnitus.

## 2019-04-02 NOTE — Assessment & Plan Note (Signed)
Potentially a muscular issue given negative MRI.  We will have him see sports medicine.

## 2019-04-02 NOTE — Telephone Encounter (Signed)
I called and informed the patient that a referral was placed for sports medicine specialist in [place] and that they will call him to schedule and he understood.  Philip Stephens,cma

## 2019-04-02 NOTE — Progress Notes (Signed)
Virtual Visit via video Note  This visit type was conducted due to national recommendations for restrictions regarding the COVID-19 pandemic (e.g. social distancing).  This format is felt to be most appropriate for this patient at this time.  All issues noted in this document were discussed and addressed.  No physical exam was performed (except for noted visual exam findings with Video Visits).   I connected with Philip Stephens today at 12:00 PM EDT by a video enabled telemedicine application or telephone and verified that I am speaking with the correct person using two identifiers. Location patient: home Location provider: work Persons participating in the virtual visit: patient, provider  I discussed the limitations, risks, security and privacy concerns of performing an evaluation and management service by telephone and the availability of in person appointments. I also discussed with the patient that there may be a patient responsible charge related to this service. The patient expressed understanding and agreed to proceed.  Reason for visit: same day  HPI: Tinnitus: Patient notes this has been going on for some time now.  No fullness though does note hearing loss left greater than right.  Tinnitus is bilateral left greater than right.  High-pitched.  Occasional feeling as though he is off balance.  No new medications though he does take quite a bit of BC powders.  He does have a history of noise exposure without ear protection.  Chronic neck pain: Notes the muscles in his neck are sore.  This has been going on for some time.  This does produce some headaches.  He is status post MRI cervical spine with no obvious cause for his neck pain.  Right-sided chest pain: This has been a chronic issue for the last year.  No cause has been found.  He has seen several cardiologist.  He has seen GI.  MRI cervical spine and thoracic spine without cause as well.  He continues to have intermittent issues  with this.  Recent CT angio with no PE though did reveal bulla on the right side though no pneumothorax.  Sees pulmonology next week.  He does take tramadol as well as Flexeril for this.   ROS: See pertinent positives and negatives per HPI.  Past Medical History:  Diagnosis Date  . Arthritis   . Hypertension     No past surgical history on file.  Family History  Problem Relation Age of Onset  . Hyperlipidemia Mother   . Hyperlipidemia Father   . Hearing loss Father   . Diabetes Father   . Arthritis Father     SOCIAL HX: Former smoker   Current Outpatient Medications:  .  cyclobenzaprine (FLEXERIL) 10 MG tablet, TAKE ONE TABLET BY MOUTH THREE TIMES A DAY AS NEEDED FOR MUSCLE SPASMS, Disp: 30 tablet, Rfl: 1 .  esomeprazole (NEXIUM) 10 MG packet, 10 mg daily at 12 noon. Take 1 tablets by mouth every morning and 2 tablets by mouth every afternoon., Disp: , Rfl:  .  metoprolol succinate (TOPROL-XL) 25 MG 24 hr tablet, Take 1 tablet by mouth daily., Disp: , Rfl:  .  pregabalin (LYRICA) 150 MG capsule, TAKE ONE CAPSULE BY MOUTH THREE TIMES A DAY  **DNF 01/27/19**, Disp: 90 capsule, Rfl: 1 .  sildenafil (VIAGRA) 50 MG tablet, TAKE 1/2 TABLET BY MOUTH DAILY AS NEEDED FOR ERECTILE DYSFUNCTION (TAKE 30 MINUTES TO 4 HOURS BEFORE SEXUAL ACTIVITY), Disp: 10 tablet, Rfl: 1 .  traMADol (ULTRAM) 50 MG tablet, TAKE TWO TABLETS BY MOUTH EVERY 12  HOURS AS NEEDED FOR PAIN, Disp: 120 tablet, Rfl: 1  EXAM:  VITALS per patient if applicable:  GENERAL: alert, oriented, appears well and in no acute distress  HEENT: atraumatic, conjunttiva clear, no obvious abnormalities on inspection of external nose and ears  NECK: normal movements of the head and neck  LUNGS: on inspection no signs of respiratory distress, breathing rate appears normal, no obvious gross SOB, gasping or wheezing  CV: no obvious cyanosis  MS: moves all visible extremities without noticeable abnormality  PSYCH/NEURO: pleasant  and cooperative, no obvious depression or anxiety, speech and thought processing grossly intact  ASSESSMENT AND PLAN:  Discussed the following assessment and plan:  Right-sided chest pain Chronic issue.  Extensive work-up has been unremarkable.  Potentially could be musculoskeletal.  I discussed with his PCP and we opted for referral to sports medicine in [place] for a second opinion.  He will also keep his appointment with pulmonology given the bulla on CT scan.  He will continue his chronic pain medications.  Chronic neck pain Potentially a muscular issue given negative MRI.  We will have him see sports medicine.  Tinnitus Potentially related to aspirin that is in [facility] powders.  I have asked him to discontinue the aspirin.  We will also refer to ENT.   Orders Placed This Encounter  Procedures  . Ambulatory referral to Sports Medicine    Referral Priority:   Routine    Referral Type:   Consultation    Number of Visits Requested:   1  . Ambulatory referral to ENT    Referral Priority:   Routine    Referral Type:   Consultation    Referral Reason:   Specialty Services Required    Requested Specialty:   Otolaryngology    Number of Visits Requested:   1    No orders of the defined types were placed in this encounter.    I discussed the assessment and treatment plan with the patient. The patient was provided an opportunity to ask questions and all were answered. The patient agreed with the plan and demonstrated an understanding of the instructions.   The patient was advised to call back or seek an in-person evaluation if the symptoms worsen or if the condition fails to improve as anticipated.    Tommi Rumps, MD

## 2019-04-02 NOTE — Telephone Encounter (Signed)
Please call the patient and let him know that I spoke with Claris Che regarding his chronic chest pain.  We think there is the potential that it could be a musculoskeletal issue and I would like for him to see a sports medicine specialist in [place] for further evaluation.  I have placed a referral for him.

## 2019-04-02 NOTE — Assessment & Plan Note (Signed)
Potentially related to aspirin that is in [facility]: [facility] Rhonda W powders.  I have asked him to discontinue the aspirin.  We will also refer to ENT.

## 2019-04-10 ENCOUNTER — Other Ambulatory Visit: Payer: Self-pay

## 2019-04-10 ENCOUNTER — Ambulatory Visit (INDEPENDENT_AMBULATORY_CARE_PROVIDER_SITE_OTHER): Payer: Managed Care, Other (non HMO) | Admitting: Pulmonary Disease

## 2019-04-10 ENCOUNTER — Encounter: Payer: Self-pay | Admitting: Pulmonary Disease

## 2019-04-10 VITALS — BP 118/74 | HR 86 | Ht 74.0 in | Wt 166.6 lb

## 2019-04-10 DIAGNOSIS — J449 Chronic obstructive pulmonary disease, unspecified: Secondary | ICD-10-CM

## 2019-04-10 DIAGNOSIS — R0602 Shortness of breath: Secondary | ICD-10-CM

## 2019-04-10 DIAGNOSIS — R0789 Other chest pain: Secondary | ICD-10-CM | POA: Diagnosis not present

## 2019-04-10 MED ORDER — STIOLTO RESPIMAT 2.5-2.5 MCG/ACT IN AERS: 2.0000 | INHALATION_SPRAY | Freq: Every day | RESPIRATORY_TRACT | 0 refills | Status: DC

## 2019-04-10 NOTE — Patient Instructions (Signed)
Your chest pain is likely related to nerve damage that you have likely from a torn muscle at the time of your injury  We will give you a trial of Stiolto 2 inhalations daily this is an inhaler to see if that helps your shortness of breath, please let us know how the Stiolto is doing for you so we can call in the prescription to your pharmacy  We will do a breathing test to evaluate your shortness of breath  We will see you in follow-up in 2 months time call sooner should any new difficulties arise

## 2019-04-11 ENCOUNTER — Encounter: Payer: Self-pay | Admitting: Pulmonary Disease

## 2019-04-11 NOTE — Progress Notes (Signed)
Subjective:    Patient ID: Philip Stephens, male    DOB: 05/02/1975, 44 y.o.   MRN: [id number]  HPI Philip Stephens is a 80 year old former smoker (quit 02/2018) who presents for evaluation of right-sided chest pain.  The patient is kindly referred by [name], FNP.  Patient also describes some dyspnea particularly with heavy exertion.  He states that symptoms started approximately November 2019 after he went to pull a sliding door open and felt a "pop" in his chest.  Since then he has noted this chest pain.  He states that initially it was quite unbearable.  It did respond to nonsteroidals somewhat.  He now admits that this is been improving slowly.  Recently on 16 March he had a CT angio chest with 2 bullae noted.  No PE and no chest wall abnormality or lesions.  The bulla in question is a very small 1 on the right and a larger bulla in the medial aspect of the left lower lobe.  Patient has had no fevers, chills or sweats.  He has not had any orthopnea or paroxysmal nocturnal dyspnea.  He does endorse indigestion and heartburn which has been somewhat improved by esomeprazole.  He notes occasional joint pain or swelling particularly when he has been working hard.  He has not had any cough or sputum production no hemoptysis.  No lower extremity edema.  He reiterates that his symptoms are improving.   Past medical history, surgical history and family history have been reviewed.  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  No exposure to inorganic dusts or fumes.  Does not endorse any other exposures.  As noted he smoked 2 packs of cigarettes from age 57 until he quit smoking in February 2020.  Review of Systems A 10 point review of systems was performed and it is as noted above otherwise negative.    Objective:   Physical Exam BP 118/74 (BP Location: Left Arm, Cuff Size: Normal)   Pulse 86   Ht 6\' 2"  (1.88 m)   Wt 166 lb 9.6 oz (75.6 kg)   SpO2 97%   BMI 21.39 kg/m    GENERAL: Well-developed well-nourished gentleman in no acute distress.  Fully ambulatory. HEAD: Normocephalic, atraumatic.  EYES: Pupils equal, round, reactive to light.  No scleral icterus.  MOUTH: Nose/mouth/throat not examined due to masking requirements for COVID 19.   NECK: Supple. No thyromegaly. No nodules. No JVD.  Trachea midline. PULMONARY: Coarse breath sounds, no other adventitious sounds. CARDIOVASCULAR: S1 and S2. Regular rate and rhythm.  No rubs murmurs gallops heard. GASTROINTESTINAL: Nondistended abdomen. MUSCULOSKELETAL: No joint deformity, no clubbing, no edema.  No chest wall tenderness. NEUROLOGIC: Awake, alert, no focal deficits.  Speech is fluent. SKIN: Intact,warm,dry.  No rashes PSYCH: Somewhat anxious, speech at times pressured.  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 [place], MD [facility]  *This note was dictated using voice recognition software/Dragon.  Despite best efforts to proofread, errors can occur which can  change the meaning.  Any change was purely unintentional.

## 2019-04-12 ENCOUNTER — Telehealth: Payer: Self-pay | Admitting: Pulmonary Disease

## 2019-04-12 ENCOUNTER — Other Ambulatory Visit: Payer: Self-pay

## 2019-04-12 ENCOUNTER — Encounter: Payer: Self-pay | Admitting: Family Medicine

## 2019-04-12 ENCOUNTER — Ambulatory Visit (INDEPENDENT_AMBULATORY_CARE_PROVIDER_SITE_OTHER): Payer: Managed Care, Other (non HMO) | Admitting: Family Medicine

## 2019-04-12 ENCOUNTER — Other Ambulatory Visit: Admission: RE | Admit: 2019-04-12 | Payer: Managed Care, Other (non HMO) | Source: Ambulatory Visit

## 2019-04-12 VITALS — BP 112/82 | HR 85 | Ht 74.0 in | Wt 167.0 lb

## 2019-04-12 DIAGNOSIS — R079 Chest pain, unspecified: Secondary | ICD-10-CM | POA: Diagnosis not present

## 2019-04-12 MED ORDER — DULOXETINE HCL 30 MG PO CPEP: 30.0000 mg | ORAL_CAPSULE | Freq: Every day | ORAL | 1 refills | Status: DC
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
## 2019-04-12 NOTE — Patient Instructions (Signed)
Thank you for coming in today. Attend PT.  Start Cymbalta. This will help with pain.  Recheck with me in 1 month.  Let me know if you have a problem sooner.    We may consider the treatment like this in the future.   Intercostal Nerve Block Patient Information  Description: The twelve intercostal nerves arise from the first thru twelfth thoracic nerve roots.  The nerve begins at the spine and wraps around the body, lying in a groove underneath each rib.  Each intercostal nerve innervates a specific strip of skin and body walk of the abdomen and chest.  Therefore, injuries of the chest wall or abdominal wall result in pain that is transmitted back to the brian via the intercostal nerves.  Examples of such injuries include rib fractures and incisions for lung and gall bladder surgery.  Occasionally, pain may persist long after an injury or surgical incision secondary to inflammation and irritation of the intercostal nerve.  The longstanding pain is known as intercostal neuralgia.  An intercostal nerve block is preformed to eliminate pain either temporarily or permanently.  A small needle is placed below the rib and local anesthetic (like Novocaine) and possibly steroid is injected.  Usually 2-4 intercostal nerves are blocked at a time depending on the problem.  The patient will experience a slight "pin-prick" sensation for each injection.  Shortly thereafter, the strip of skin that is innervated by the blocked intercostal nerve will feel numb.  Persistent pain that is only temporarily relieved with local anesthetic may require a more permanent block. This procedure is called Cryoneurolysis and entails placing a small probe beneath the rib to freeze the nerve.  Conditions that may be treated by intercostal nerve blocks:   Rib fractures  Longstanding pain from surgery of the chest or abdomen (intercostal neuralgia)  Pain from chest tubes  Pain from trauma to the chest  Preparation for the  injections:  1. Do not eat any solid food or dairy products within 8 hours of your appointment. 2. You may drink clear liquids up to 3 hours before appointment.  Clear liquids include water, black coffee, juice or soda.  No milk or cream please. 3. You may take your regular medication, including pain medications, with a sip of water before your appointment.  Diabetics should hold regular insulin (if take separately) and take 1/2 normal NPH dose the morning of the procedure.   Carry some sugar containing items with you to your appointment. 4. A driver must accompany you and be prepared to drive you home after your procedure. 5. Bring all your current medications with you. 6. An IV may be inserted and sedation may be given at the discretion of the physician. 7. A blood pressure cuff, EKG and other monitors will often be applied during the procedure.  Some patients may need to have extra oxygen administered for a short period. 8. You will be asked to provide medical information, including your allergies, prior to the procedure.  We must know immediately if you are taking blood thinners (like Coumadin/Warfarin) or if you are allergic to IV iodine contrast (dye). We must know if you could possible be pregnant.  Possible side-effects:   Bleeding from needle site  Infection (rare)  Nerve injury (rare)  Numbness & tingling of skin  Collapsed lung requiring chest tube (rare)  Local anesthetic toxicity (rare)  Light-headedness (temporary)  Pain at injection site (several days)  Decreased blood pressure (temporary)  Shortness of breath  Jittery/shaking  sensation (temporary)  Call if you experience:   Difficulty breathing or hives (go directly to the emergency room)  Redness, inflammation or drainage at the injection site  Severe pain at the site of the injection  Any new symptoms which are concerning   Please note:  Your pain may subside immediately but may return several hours  after the injection.  Often, more than one injection is required to reduce the pain. Also, if several temporary blocks with local anesthetic are ineffective, a more permanent block with cryolysis may be necessary.  This will be discussed with you should this be the case.

## 2019-04-12 NOTE — Telephone Encounter (Signed)
PFT R/S to Tues 04/23/2019 at 4:15. Pt to arrive at 4:00 to [facility].  COVID Test to be done on Monday 04/22/2019 at [facility] between 8:00 am to 11:00 am. LMOVM for pt to return call to confirm appointments.  Message sent to Teena Dunk to schedule COVID test.  Appointment information mailed to patient along with PFT instructions. Rhonda J Cobb

## 2019-04-15 ENCOUNTER — Ambulatory Visit: Payer: Managed Care, Other (non HMO)

## 2019-04-15 ENCOUNTER — Encounter: Payer: Self-pay | Admitting: Family

## 2019-04-16 NOTE — Telephone Encounter (Signed)
ATC patient to touch base with him in regards to his PFT being rescheduled. Left voicemail with all the information. Explained that he could call the office back if he had any questions. Nothing further needed at this time

## 2019-04-17 ENCOUNTER — Telehealth (INDEPENDENT_AMBULATORY_CARE_PROVIDER_SITE_OTHER): Payer: Managed Care, Other (non HMO) | Admitting: Family

## 2019-04-17 ENCOUNTER — Encounter: Payer: Self-pay | Admitting: Family

## 2019-04-17 DIAGNOSIS — K219 Gastro-esophageal reflux disease without esophagitis: Secondary | ICD-10-CM

## 2019-04-17 DIAGNOSIS — R079 Chest pain, unspecified: Secondary | ICD-10-CM

## 2019-04-17 NOTE — Progress Notes (Signed)
Virtual Visit via Video Note  I connected with@  on 04/17/19 at  3:00 PM EDT by a video enabled telemedicine application and verified that I am speaking with the correct person using two identifiers.  Location patient: home Location provider:work  Persons participating in the virtual visit: patient, provider  I discussed the limitations of evaluation and management by telemedicine and the availability of in person appointments. The patient expressed understanding and agreed to proceed.   HPI: Follow up  Continues to have right sided chest wall pain, improved. ' I think I am getting better.' Pain is 'bearable'.  'im trying to push myself more'. Mowing grass and feels that is aggravated.  Flexeril is helpful. Lyrica and tramadol are helpful.  Dr Denyse Amass 04/12/19 - recommended PT. started on cymbalta, however felt very groggy on medication. Since stopped.   SOB unchanged. 'only SOB when goes up 3 flights of stairs.'  Charlynn Grimes- 04/15/19 - continue PT.  Dr Gonzalez-04/10/2019- pending LFTs  GERD- taking nexium, pepcid. No epigastric burning, regurgitation. Continues to have trigger foods such hot foods.  Takes [employer] daily.  ROS: See pertinent positives and negatives per HPI.  Past Medical History:  Diagnosis Date  . Arthritis   . Hypertension     No past surgical history on file.  Family History  Problem Relation Age of Onset  . Hyperlipidemia Mother   . Hyperlipidemia Father   . Hearing loss Father   . Diabetes Father   . Arthritis Father        Current Outpatient Medications:  .  cyclobenzaprine (FLEXERIL) 10 MG tablet, TAKE ONE TABLET BY MOUTH THREE TIMES A DAY AS NEEDED FOR MUSCLE SPASMS, Disp: 30 tablet, Rfl: 1 .  esomeprazole (NEXIUM) 10 MG packet, 10 mg daily at 12 noon. Take 1 tablets by mouth every morning and 1 tablets by mouth every afternoon., Disp: , Rfl:  .  famotidine (PEPCID) 20 MG tablet, Take 20 mg by mouth 2 (two) times daily., Disp: , Rfl:  .   metoprolol succinate (TOPROL-XL) 25 MG 24 hr tablet, Take 1 tablet by mouth daily., Disp: , Rfl:  .  pregabalin (LYRICA) 150 MG capsule, TAKE ONE CAPSULE BY MOUTH THREE TIMES A DAY  **DNF 01/27/19**, Disp: 90 capsule, Rfl: 1 .  Tiotropium Bromide-Olodaterol (STIOLTO RESPIMAT) 2.5-2.5 MCG/ACT AERS, Inhale 2 puffs into the lungs daily., Disp: 4 g, Rfl: 0 .  traMADol (ULTRAM) 50 MG tablet, TAKE TWO TABLETS BY MOUTH EVERY 12 HOURS AS NEEDED FOR PAIN, Disp: 120 tablet, Rfl: 1  EXAM:  VITALS per patient if applicable:  GENERAL: alert, oriented, appears well and in no acute distress  HEENT: atraumatic, conjunttiva clear, no obvious abnormalities on inspection of external nose and ears  NECK: normal movements of the head and neck  LUNGS: on inspection no signs of respiratory distress, breathing rate appears normal, no obvious gross SOB, gasping or wheezing  CV: no obvious cyanosis  MS: moves all visible extremities without noticeable abnormality  PSYCH/NEURO: pleasant and cooperative, no obvious depression or anxiety, speech and thought processing grossly intact  ASSESSMENT AND PLAN:  Discussed the following assessment and plan:  No diagnosis found. Problem List Items Addressed This Visit    None      -we discussed possible serious and likely etiologies, options for evaluation and workup, limitations of telemedicine visit vs in person visit, treatment, treatment risks and precautions. Pt prefers to treat via telemedicine empirically rather then risking or undertaking an in person visit at  this moment. Patient agrees to seek prompt in person care if worsening, new symptoms arise, or if is not improving with treatment.   I discussed the assessment and treatment plan with the patient. The patient was provided an opportunity to ask questions and all were answered. The patient agreed with the plan and demonstrated an understanding of the instructions.   The patient was advised to call back or  seek an in-person evaluation if the symptoms worsen or if the condition fails to improve as anticipated.   Mable Paris, FNP

## 2019-04-17 NOTE — Assessment & Plan Note (Signed)
Stable. Controlled with Nexium, pepcid. Advised against Goodys as I have in the past. Will follow

## 2019-04-17 NOTE — Assessment & Plan Note (Signed)
Some improvement. Pleased with patient saying 'bearable'. Advised patient to start PT as referred by Dr Denyse Amass. No changes to medications today.

## 2019-04-19 ENCOUNTER — Other Ambulatory Visit: Payer: Self-pay | Admitting: Family

## 2019-04-19 ENCOUNTER — Encounter: Payer: Self-pay | Admitting: Family

## 2019-04-19 DIAGNOSIS — I2510 Atherosclerotic heart disease of native coronary artery without angina pectoris: Secondary | ICD-10-CM

## 2019-04-19 DIAGNOSIS — I2584 Coronary atherosclerosis due to calcified coronary lesion: Secondary | ICD-10-CM

## 2019-04-20 NOTE — Progress Notes (Signed)
Date:  04/22/2019   ID:  Philip Stephens, DOB [birth year], MRN [id number]  Patient Location:  [place] [address]   Provider location:   [facility], [place] office  PCP:  Burnard Hawthorne, FNP  Cardiologist:  Patsy Baltimore   Chief Complaint  Patient presents with  . office visit    Pt states still having Right side chest pain/ fatigue. Meds verbally reviewed w/ pt.     History of Present Illness:    Philip Stephens is a 44 y.o. male past medical history of Anxiety panic attacks Chronic back pain Former smoker, quit 02/2018 Who presents for follow-up of his chest pain   November 2019, was opening a door when he felt a pop, hurt his chest on the right Went to the ER, "checked out ok" Has had stuttering pain since that time On today's visit reports that he continues to have pain  Describes it as pain, right lower rib area tender on palpation Radiating to back,  Hurts after mowing After walking up steps Cant sleep on right side Previously seen by sports medication and APP for Dr. Sharlet Salina   also reports having some neck problems  Anxious about having chest pain back pain Ears ringing at times Previously declined medication for anxiety through primary care  Used to work in maintenance, Used to lifts heavy items Now doing nonexertional stuff at [employer]  Numerous recent imaging studies reviewed with him MR cervical and thoracic: No acute finding  CT chest: no acute finding  Upper GI/egd Nothing acute  Previous studies reviewed Coronary calcium score 11/2018, results discussed with him  of 3. This was 78th percentile for age and sex matched control.  Discussed his cholesterol, Total cholesterol up to 240  EKG personally reviewed by myself on todays visit Shows normal sinus rhythm rate 83 bpm no significant ST-T wave changes   Prior CV studies:   The following studies were reviewed today:    Past Medical  History:  Diagnosis Date  . Arthritis   . Hypertension    History reviewed. No pertinent surgical history.    Allergies:   Penicillins   Social History   Tobacco Use  . Smoking status: Former Smoker    Packs/day: 2.00    Years: 28.00    Pack years: 56.00    Types: Cigarettes    Quit date: 02/19/2018    Years since quitting: 1.1  . Smokeless tobacco: Former Systems developer  . Tobacco comment: Does [employer]. ( has nicotine , no tobacco).   Substance Use Topics  . Alcohol use: Not Currently  . Drug use: Never     Current Outpatient Medications on File Prior to Visit  Medication Sig Dispense Refill  . cyclobenzaprine (FLEXERIL) 10 MG tablet TAKE ONE TABLET BY MOUTH THREE TIMES A DAY AS NEEDED FOR MUSCLE SPASMS 30 tablet 1  . esomeprazole (NEXIUM) 10 MG packet 10 mg daily at 12 noon. Take 1 tablets by mouth every morning and 1 tablets by mouth every afternoon.    . famotidine (PEPCID) 20 MG tablet Take 20 mg by mouth 2 (two) times daily.    . metoprolol succinate (TOPROL-XL) 25 MG 24 hr tablet Take 1 tablet by mouth daily.    . pregabalin (LYRICA) 150 MG capsule TAKE ONE CAPSULE BY MOUTH THREE TIMES A DAY  **DNF 01/27/19** 90 capsule 1  . Tiotropium Bromide-Olodaterol (STIOLTO RESPIMAT) 2.5-2.5 MCG/ACT AERS Inhale 2 puffs into  the lungs daily. 4 g 0  . traMADol (ULTRAM) 50 MG tablet TAKE TWO TABLETS BY MOUTH EVERY 12 HOURS AS NEEDED FOR PAIN 120 tablet 1   No current facility-administered medications on file prior to visit.     Family Hx: The patient's family history includes Arthritis in his father; Diabetes in his father; Hearing loss in his father; Hyperlipidemia in his father and mother.  ROS:   Please see the history of present illness.    Review of Systems  Constitutional: Negative.   HENT: Negative.   Respiratory: Negative.   Cardiovascular: Negative.   Gastrointestinal: Negative.   Musculoskeletal: Negative.   Neurological: Negative.   Psychiatric/Behavioral: Negative.   All  other systems reviewed and are negative.    Labs/Other Tests and Data Reviewed:    Recent Labs: 09/17/2018: Magnesium 1.9 01/21/2019: ALT 13; BUN 15; Hemoglobin 13.5; Platelets 285.0; Potassium 4.0; Sodium 140; TSH 2.05 03/19/2019: Creatinine, Ser 1.10   Recent Lipid Panel Lab Results  Component Value Date/Time   CHOL 242 (H) 01/21/2019 02:58 PM   TRIG 367.0 (H) 01/21/2019 02:58 PM   HDL 42.00 01/21/2019 02:58 PM   CHOLHDL 6 01/21/2019 02:58 PM   LDLDIRECT 152.0 01/21/2019 02:58 PM    Wt Readings from Last 3 Encounters:  04/22/19 168 lb 8 oz (76.4 kg)  04/12/19 167 lb (75.8 kg)  04/10/19 166 lb 9.6 oz (75.6 kg)     Exam:    BP 116/78 (BP Location: Left Arm, Patient Position: Sitting, Cuff Size: Normal)   Pulse 83   Ht 6\' 2"  (1.88 m)   Wt 168 lb 8 oz (76.4 kg)   SpO2 97%   BMI 21.63 kg/m  Constitutional:  oriented to person, place, and time. No distress.  HENT:  Head: Grossly normal Eyes:  no discharge. No scleral icterus.  Neck: No JVD, no carotid bruits  Cardiovascular: Regular rate and rhythm, no murmurs appreciated Pulmonary/Chest: Clear to auscultation bilaterally, no wheezes or rails Abdominal: Soft.  no distension.  no tenderness.  Musculoskeletal: Normal range of motion Neurological:  normal muscle tone. Coordination normal. No atrophy Skin: Skin warm and dry Psychiatric: normal affect, pleasant   ASSESSMENT & PLAN:    Problem List Items Addressed This Visit      Cardiology Problems   Coronary artery calcification - Primary   Relevant Orders   EKG 12-Lead     Other   Anxiety    Other Visit Diagnoses    Hyperlipidemia, unspecified hyperlipidemia type         Musculoskeletal rib pain Occasional discomfort, lifting heavy items at work Pain after walking up steps, laying on his right side, with palpation Noncardiac in nature Reports he is scheduled to start physical therapy Numerous imaging studies reviewed with him Reassurance  provided  Coronary calcification Calcium score of 3, minimally elevated This would not cause his chest pain which is clearly atypical, musculoskeletal  Hyperlipidemia He is inclined not to start a cholesterol medication at this time in the setting of chronic pain  Prior smoking history Reports that he did smoke for many years, now quit   Total encounter time more than 25 minutes  Greater than 50% was spent in counseling and coordination of care with the patient  Disposition: Follow-up as needed   Signed, , MD  [facility] [address], [place], [place]

## 2019-04-22 ENCOUNTER — Ambulatory Visit: Payer: Managed Care, Other (non HMO) | Admitting: Family

## 2019-04-22 ENCOUNTER — Other Ambulatory Visit: Payer: Self-pay

## 2019-04-22 ENCOUNTER — Other Ambulatory Visit
Admission: RE | Admit: 2019-04-22 | Discharge: 2019-04-22 | Disposition: A | Discharge status: Home / Self Care | Payer: Managed Care, Other (non HMO) | Source: Ambulatory Visit | Attending: Pulmonary Disease | Admitting: Pulmonary Disease

## 2019-04-22 ENCOUNTER — Ambulatory Visit (INDEPENDENT_AMBULATORY_CARE_PROVIDER_SITE_OTHER): Payer: Managed Care, Other (non HMO) | Admitting: Cardiovascular Disease

## 2019-04-22 ENCOUNTER — Encounter: Payer: Self-pay | Admitting: Cardiovascular Disease

## 2019-04-22 VITALS — BP 116/78 | HR 83 | Ht 74.0 in | Wt 168.5 lb

## 2019-04-22 DIAGNOSIS — E785 Hyperlipidemia, unspecified: Secondary | ICD-10-CM

## 2019-04-22 DIAGNOSIS — I2510 Atherosclerotic heart disease of native coronary artery without angina pectoris: Secondary | ICD-10-CM | POA: Diagnosis not present

## 2019-04-22 DIAGNOSIS — Z01812 Encounter for preprocedural laboratory examination: Secondary | ICD-10-CM | POA: Insufficient documentation

## 2019-04-22 DIAGNOSIS — F419 Anxiety disorder, unspecified: Secondary | ICD-10-CM | POA: Diagnosis not present

## 2019-04-22 DIAGNOSIS — I2584 Coronary atherosclerosis due to calcified coronary lesion: Secondary | ICD-10-CM

## 2019-04-22 DIAGNOSIS — Z20822 Contact with and (suspected) exposure to covid-19: Secondary | ICD-10-CM | POA: Insufficient documentation

## 2019-04-22 LAB — SARS CORONAVIRUS 2 (TAT 6-24 HRS): SARS Coronavirus 2: NEGATIVE

## 2019-04-22 NOTE — Patient Instructions (Signed)

## 2019-04-23 ENCOUNTER — Other Ambulatory Visit: Payer: Managed Care, Other (non HMO)

## 2019-04-23 ENCOUNTER — Ambulatory Visit: Payer: Managed Care, Other (non HMO)

## 2019-04-24 ENCOUNTER — Telehealth: Payer: Self-pay

## 2019-04-24 NOTE — Telephone Encounter (Signed)
I received a message from PFT stating that patient did not show for his testing.

## 2019-04-24 NOTE — Telephone Encounter (Signed)
04/24/2019 at 11:47 am LMOVM for pt to return call to R/S COVID test and PFT per Dr. Jayme Cloud. Rhonda J Cobb

## 2019-04-24 NOTE — Telephone Encounter (Signed)
The impressive thing was that he showed up for his Covid test.  We will need to reschedule

## 2019-04-24 NOTE — Telephone Encounter (Signed)
Can you please reach out to patient to have his PFT rescheduled. Thank you

## 2019-04-25 ENCOUNTER — Other Ambulatory Visit: Payer: Self-pay | Admitting: Family

## 2019-04-25 DIAGNOSIS — M545 Low back pain, unspecified: Secondary | ICD-10-CM

## 2019-04-25 DIAGNOSIS — G8929 Other chronic pain: Secondary | ICD-10-CM

## 2019-05-05 ENCOUNTER — Encounter: Payer: Self-pay | Admitting: Family

## 2019-05-10 NOTE — Telephone Encounter (Signed)
LMTCB

## 2019-05-13 ENCOUNTER — Ambulatory Visit: Payer: Managed Care, Other (non HMO) | Admitting: Family Medicine

## 2019-05-13 DIAGNOSIS — Z0289 Encounter for other administrative examinations: Secondary | ICD-10-CM

## 2019-05-13 NOTE — Progress Notes (Deleted)
   I, Christoper Fabian, LAT, ATC, am serving as scribe for Dr. Clementeen Graham.  Philip Stephens is a 44 y.o. male who presents to [facility] today for f/u of chronic R-sided chest pain x over one year after forcefully closing a sliding door and feeling a "pop" in his chest.  He was last seen by Dr. Denyse Amass on 04/12/19 and was prescribed Cymbalta and referred to PT.  Since his last visit, pt reports  Diagnostic testing: Chest CT- 03/19/19; C-spine and T-spine MRI- 02/25/19   Pertinent review of systems: ***  Relevant historical information: ***   Exam:  There were no vitals taken for this visit. General: Well Developed, well nourished, and in no acute distress.   MSK: ***    Lab and Radiology Results No results found for this or any previous visit (from the past 72 hour(s)). No results found.     Assessment and Plan: 44 y.o. male with ***   PDMP not reviewed this encounter. No orders of the defined types were placed in this encounter.  No orders of the defined types were placed in this encounter.    Discussed warning signs or symptoms. Please see discharge instructions. Patient expresses understanding.   ***

## 2019-05-17 ENCOUNTER — Encounter: Payer: Self-pay | Admitting: Family

## 2019-05-17 ENCOUNTER — Telehealth (INDEPENDENT_AMBULATORY_CARE_PROVIDER_SITE_OTHER): Payer: Managed Care, Other (non HMO) | Admitting: Family

## 2019-05-17 DIAGNOSIS — M7989 Other specified soft tissue disorders: Secondary | ICD-10-CM | POA: Insufficient documentation

## 2019-05-17 DIAGNOSIS — M545 Low back pain, unspecified: Secondary | ICD-10-CM

## 2019-05-17 DIAGNOSIS — G8929 Other chronic pain: Secondary | ICD-10-CM | POA: Diagnosis not present

## 2019-05-17 NOTE — Assessment & Plan Note (Signed)
Etiology nonspecific however stiffness, swelling raises concern for autoimmune. Pending labs.

## 2019-05-17 NOTE — Assessment & Plan Note (Addendum)
Overall at baseline. Advised patient that is pain in under treated and I would advise consult with pain management. Patient continues to decline at this time. Strongly emphasized doing PT as referred by Dr Denyse Amass when work schedule allows. Will continue current regimen.

## 2019-05-17 NOTE — Progress Notes (Signed)
Virtual Visit via Video Note  I connected with@  on 05/17/19 at 10:30 AM EDT by a video enabled telemedicine application and verified that I am speaking with the correct person using two identifiers.  Location patient: home Location provider:work  Persons participating in the virtual visit: patient, provider  I discussed the limitations of evaluation and management by telemedicine and the availability of in person appointments. The patient expressed understanding and agreed to proceed.   HPI: Follow up neck, chest pain, and low back pain. Feels '150% better than was in the past. Feels quality of life has improved.  Continues to 'nerve' pain on right side of chest with overall improvement. Feels very sure that pain in chest is related to 'nerve pain'. Fatigue and 'overworking' with [employer] and can tell that nerve pain is worse. Work has become more physical with lifting. No fever. Endorses stiffness in  Hands, pip joint swelling, and neck in the morning. Interested in acupuncture.  Chronic low back pain- at baseline. Wearing low back brace all the time. Taking 4 tramadol total per day. Compliant with lyrica. Not sure that tramadol is as effective as has been in the past.  No sob, exertional chest pain, left arm numbness, dizziness, HA, vision changes.   Works at [employer].   Consult Dr Denyse Amass 04/12/19- planning on PT however hasnt time.   ROS: See pertinent positives and negatives per HPI.  Past Medical History:  Diagnosis Date  . Arthritis   . Hypertension     History reviewed. No pertinent surgical history.  Family History  Problem Relation Age of Onset  . Hyperlipidemia Mother   . Hyperlipidemia Father   . Hearing loss Father   . Diabetes Father   . Arthritis Father        Current Outpatient Medications:  .  cyclobenzaprine (FLEXERIL) 10 MG tablet, TAKE ONE TABLET BY MOUTH THREE TIMES A DAY AS NEEDED FOR MUSCLE SPASMS, Disp: 30 tablet, Rfl: 1 .  esomeprazole (NEXIUM) 10 MG  packet, 10 mg daily before breakfast. Take 1 tablets by mouth every morning., Disp: , Rfl:  .  famotidine (PEPCID) 20 MG tablet, Take 20 mg by mouth 2 (two) times daily., Disp: , Rfl:  .  metoprolol succinate (TOPROL-XL) 25 MG 24 hr tablet, Take 1 tablet by mouth daily., Disp: , Rfl:  .  pregabalin (LYRICA) 150 MG capsule, TAKE ONE CAPSULE BY MOUTH THREE TIMES A DAY  **DNF 01/27/19**, Disp: 90 capsule, Rfl: 1 .  Tiotropium Bromide-Olodaterol (STIOLTO RESPIMAT) 2.5-2.5 MCG/ACT AERS, Inhale 2 puffs into the lungs daily., Disp: 4 g, Rfl: 0 .  traMADol (ULTRAM) 50 MG tablet, TAKE TWO TABLETS BY MOUTH EVERY 12 HOURS AS NEEDED FOR PAIN, Disp: 120 tablet, Rfl: 0  EXAM:  VITALS per patient if applicable:  GENERAL: alert, oriented, appears well and in no acute distress  HEENT: atraumatic, conjunttiva clear, no obvious abnormalities on inspection of external nose and ears  NECK: normal movements of the head and neck  LUNGS: on inspection no signs of respiratory distress, breathing rate appears normal, no obvious gross SOB, gasping or wheezing  CV: no obvious cyanosis  MS: moves all visible extremities without noticeable abnormality  PSYCH/NEURO: pleasant and cooperative, no obvious depression or anxiety, speech and thought processing grossly intact  ASSESSMENT AND PLAN:  Discussed the following assessment and plan:  Chronic midline low back pain without sciatica  Swelling of both hands - Plan: ANA, C-reactive protein, CYCLIC CITRUL PEPTIDE ANTIBODY, IGG/IGA, Rheumatoid factor, Sedimentation rate  Problem List Items Addressed This Visit      Other   Chronic midline low back pain without sciatica    Overall at baseline. Advised patient that is pain in under treated and I would advise consult with pain management. Patient continues to decline at this time. Strongly emphasized doing PT as referred by Dr Georgina Snell when work schedule allows. Will continue current regimen.       Hand swelling     Etiology nonspecific however stiffness, swelling raises concern for autoimmune. Pending labs.       Relevant Orders   ANA   C-reactive protein   CYCLIC CITRUL PEPTIDE ANTIBODY, IGG/IGA   Rheumatoid factor   Sedimentation rate      -we discussed possible serious and likely etiologies, options for evaluation and workup, limitations of telemedicine visit vs in person visit, treatment, treatment risks and precautions. Pt prefers to treat via telemedicine empirically rather then risking or undertaking an in person visit at this moment. Patient agrees to seek prompt in person care if worsening, new symptoms arise, or if is not improving with treatment.   I discussed the assessment and treatment plan with the patient. The patient was provided an opportunity to ask questions and all were answered. The patient agreed with the plan and demonstrated an understanding of the instructions.   The patient was advised to call back or seek an in-person evaluation if the symptoms worsen or if the condition fails to improve as anticipated.   Mable Paris, FNP

## 2019-05-20 ENCOUNTER — Encounter: Payer: Self-pay | Admitting: Family

## 2019-05-20 ENCOUNTER — Other Ambulatory Visit (INDEPENDENT_AMBULATORY_CARE_PROVIDER_SITE_OTHER): Payer: Managed Care, Other (non HMO)

## 2019-05-20 ENCOUNTER — Other Ambulatory Visit: Payer: Self-pay

## 2019-05-20 ENCOUNTER — Other Ambulatory Visit: Payer: Self-pay | Admitting: Family

## 2019-05-20 DIAGNOSIS — I2510 Atherosclerotic heart disease of native coronary artery without angina pectoris: Secondary | ICD-10-CM | POA: Diagnosis not present

## 2019-05-20 DIAGNOSIS — Z20822 Contact with and (suspected) exposure to covid-19: Secondary | ICD-10-CM

## 2019-05-20 DIAGNOSIS — M7989 Other specified soft tissue disorders: Secondary | ICD-10-CM

## 2019-05-20 DIAGNOSIS — I2584 Coronary atherosclerosis due to calcified coronary lesion: Secondary | ICD-10-CM | POA: Diagnosis not present

## 2019-05-20 DIAGNOSIS — M545 Low back pain, unspecified: Secondary | ICD-10-CM

## 2019-05-20 DIAGNOSIS — G8929 Other chronic pain: Secondary | ICD-10-CM

## 2019-05-20 LAB — LDL CHOLESTEROL, DIRECT: Direct LDL: 154 mg/dL

## 2019-05-20 LAB — LIPID PANEL
Cholesterol: 248 mg/dL — ABNORMAL HIGH (ref 0–200)
HDL: 37.6 mg/dL — ABNORMAL LOW (ref >39.00)
NonHDL: 210.75
Total CHOL/HDL Ratio: 7
Triglycerides: 258 mg/dL — ABNORMAL HIGH (ref 0.0–149.0)
VLDL: 51.6 mg/dL — ABNORMAL HIGH (ref 0.0–40.0)

## 2019-05-20 LAB — HEMOGLOBIN A1C: Hgb A1c MFr Bld: 6.2 % (ref 4.6–6.5)

## 2019-05-20 LAB — SEDIMENTATION RATE: Sed Rate: 3 mm/hr (ref 0–15)

## 2019-05-20 LAB — C-REACTIVE PROTEIN: CRP: <1 mg/dL (ref 0.5–20.0)

## 2019-05-20 MED ORDER — TRAMADOL HCL 50 MG PO TABS: ORAL_TABLET | ORAL | 1 refills | Status: DC

## 2019-05-20 MED ORDER — PREGABALIN 150 MG PO CAPS: ORAL_CAPSULE | ORAL | 1 refills | Status: DC

## 2019-05-20 NOTE — Progress Notes (Signed)
I looked up patient on  Controlled Substances Reporting System PMP AWARE and saw no activity that raised concern of inappropriate use.   

## 2019-05-20 NOTE — Addendum Note (Signed)
Addended by: Bonnell Public I on: 05/20/2019 01:29 PM   Modules accepted: Orders

## 2019-05-21 LAB — VITAMIN D 25 HYDROXY (VIT D DEFICIENCY, FRACTURES): VITD: 35.69 ng/mL (ref 30.00–100.00)

## 2019-05-21 LAB — SARS COV-2 SEROLOGY(COVID-19)AB(IGG,IGM),IMMUNOASSAY
SARS CoV-2 AB IgG: NEGATIVE
SARS CoV-2 IgM: NEGATIVE

## 2019-05-22 LAB — CYCLIC CITRUL PEPTIDE ANTIBODY, IGG/IGA: Cyclic Citrullin Peptide Ab: 7 units (ref 0–19)

## 2019-05-23 LAB — ANTI-NUCLEAR AB-TITER (ANA TITER): ANA Titer 1: 1:40 {titer} — ABNORMAL HIGH

## 2019-05-23 LAB — RHEUMATOID FACTOR: Rheumatoid fact SerPl-aCnc: <14 IU/mL (ref <14)

## 2019-05-23 LAB — ANA: Anti Nuclear Antibody (ANA): POSITIVE — AB

## 2019-05-24 ENCOUNTER — Encounter: Payer: Self-pay | Admitting: Family

## 2019-05-26 ENCOUNTER — Encounter: Payer: Self-pay | Admitting: Family

## 2019-05-27 ENCOUNTER — Ambulatory Visit (INDEPENDENT_AMBULATORY_CARE_PROVIDER_SITE_OTHER): Payer: Managed Care, Other (non HMO) | Admitting: Nurse Practitioner

## 2019-05-27 ENCOUNTER — Encounter: Payer: Self-pay | Admitting: Nurse Practitioner

## 2019-05-27 ENCOUNTER — Other Ambulatory Visit: Payer: Self-pay | Admitting: Family

## 2019-05-27 ENCOUNTER — Other Ambulatory Visit: Payer: Self-pay

## 2019-05-27 VITALS — BP 130/84 | HR 87 | Temp 98.0°F | Ht 74.0 in | Wt 167.0 lb

## 2019-05-27 DIAGNOSIS — M25561 Pain in right knee: Secondary | ICD-10-CM | POA: Diagnosis not present

## 2019-05-27 DIAGNOSIS — R2241 Localized swelling, mass and lump, right lower limb: Secondary | ICD-10-CM

## 2019-05-27 DIAGNOSIS — G8929 Other chronic pain: Secondary | ICD-10-CM

## 2019-05-27 NOTE — Patient Instructions (Addendum)
Please stop using the knee tight band as it made your lower leg fluids swell up some. It functioned like a tourniquet.  I do not see any abnormality today. If you need a knee brace- use the wide one.   Consider orthopedic evaluation of your knee that gets out of place at times.   I recommend the Covid vaccine [employer]. Call [phone].   Marland Kitchen

## 2019-05-27 NOTE — Progress Notes (Signed)
Established Patient Office Visit  Subjective:  Patient ID: Philip Stephens, male    DOB: 07-25-1975  Age: 44 y.o. MRN: [id number]  CC:  Chief Complaint  Patient presents with  . Acute Visit    knot on shin    HPI EYAN Stephens presents for a knot on right anterior shin that he noticed yesterday. He worried about a blood clot. It has resolved today. He thinks he knows what caused it. His right lateral knee sometimes gets out of alignment and he sees a bone pop out at the lateral knee. This hurts. To get it back in place, he does a squat or lays down. In order to keep it from popping out again he ties a narrow strap all of the way around his inferior knee cap and pulls it as tight as he can. He will go to work with this in place and wear it all day. Yesterday, when he got home he saw some swelling in his lower leg. He removed the strap and the swelling resolved. He has no calf pain, tenderness, or swelling now. The knee is in place. He does have a wide, elastic knee flexible brace that he can use instead. He has not had his knee evaluated by Orthopedics because it never bothers him otherwise.    Past Medical History:  Diagnosis Date  . Arthritis   . Hypertension     No past surgical history on file.  Family History  Problem Relation Age of Onset  . Hyperlipidemia Mother   . Hyperlipidemia Father   . Hearing loss Father   . Diabetes Father   . Arthritis Father     Social History   Socioeconomic History  . Marital status: Married    Spouse name: Not on file  . Number of children: Not on file  . Years of education: Not on file  . Highest education level: Not on file  Occupational History  . Not on file  Tobacco Use  . Smoking status: Former Smoker    Packs/day: 2.00    Years: 28.00    Pack years: 56.00    Types: Cigarettes    Quit date: 02/19/2018    Years since quitting: 1.2  . Smokeless tobacco: Former Neurosurgeon  . Tobacco comment: Does Velo. ( has nicotine , no  tobacco).   Substance and Sexual Activity  . Alcohol use: Not Currently  . Drug use: Never  . Sexual activity: Yes  Other Topics Concern  . Not on file  Social History Narrative   Works at [employer].    2 boys- 7 and 15   1 daughter 43   Social Determinants of Corporate investment banker Strain:   . Difficulty of Paying Living Expenses:   Food Insecurity:   . Worried About Programme researcher, broadcasting/film/video in the Last Year:   . Barista in the Last Year:   Transportation Needs:   . Freight forwarder (Medical):   Marland Kitchen Lack of Transportation (Non-Medical):   Physical Activity:   . Days of Exercise per Week:   . Minutes of Exercise per Session:   Stress:   . Feeling of Stress :   Social Connections:   . Frequency of Communication with Friends and Family:   . Frequency of Social Gatherings with Friends and Family:   . Attends Religious Services:   . Active Member of Clubs or Organizations:   . Attends Banker Meetings:   .  Marital Status:   Intimate Partner Violence:   . Fear of Current or Ex-Partner:   . Emotionally Abused:   Marland Kitchen Physically Abused:   . Sexually Abused:     Outpatient Medications Prior to Visit  Medication Sig Dispense Refill  . cyclobenzaprine (FLEXERIL) 10 MG tablet TAKE ONE TABLET BY MOUTH THREE TIMES A DAY AS NEEDED FOR MUSCLE SPASMS 30 tablet 1  . esomeprazole (NEXIUM) 10 MG packet 10 mg daily before breakfast. Take 1 tablets by mouth every morning.    . famotidine (PEPCID) 20 MG tablet Take 20 mg by mouth 2 (two) times daily.    . metoprolol succinate (TOPROL-XL) 25 MG 24 hr tablet Take 1 tablet by mouth daily.    . pregabalin (LYRICA) 150 MG capsule TAKE ONE CAPSULE BY MOUTH THREE TIMES A DAY 90 capsule 1  . Tiotropium Bromide-Olodaterol (STIOLTO RESPIMAT) 2.5-2.5 MCG/ACT AERS Inhale 2 puffs into the lungs daily. 4 g 0  . traMADol (ULTRAM) 50 MG tablet TAKE TWO TABLETS BY MOUTH EVERY 12 HOURS AS NEEDED FOR PAIN 120 tablet 1   No  facility-administered medications prior to visit.    Allergies  Allergen Reactions  . Penicillins Swelling   Review of Systems  Constitutional: Negative for chills and fever.  HENT: Negative.   Eyes: Negative.   Respiratory: Negative for cough and shortness of breath.   Cardiovascular: Positive for leg swelling. Negative for chest pain and palpitations.       None now- See HPI.   Gastrointestinal: Negative.   Musculoskeletal: Positive for arthralgias.       See HPI- right knee - a bone pops out of place.   Neurological: Negative.   Hematological: Negative.   Psychiatric/Behavioral: Negative.       Objective:    Physical Exam  Constitutional: He is oriented to person, place, and time. He appears well-developed and well-nourished.  Musculoskeletal:        General: No tenderness, deformity or edema. Normal range of motion.  Neurological: He is alert and oriented to person, place, and time.  Skin: Skin is warm and dry. No rash noted.  Psychiatric: He has a normal mood and affect. His behavior is normal. Thought content normal.  Vitals reviewed.   BP 130/84 (BP Location: Left Arm, Patient Position: Sitting, Cuff Size: Small)   Pulse 87   Temp 98 F (36.7 C) (Skin)   Ht 6\' 2"  (1.88 m)   Wt 167 lb (75.8 kg)   SpO2 98%   BMI 21.44 kg/m  Wt Readings from Last 3 Encounters:  05/27/19 167 lb (75.8 kg)  05/17/19 168 lb (76.2 kg)  04/22/19 168 lb 8 oz (76.4 kg)     Health Maintenance Due  Topic Date Due  . COVID-19 Vaccine (1) Never done  . HIV Screening  Never done    There are no preventive care reminders to display for this patient.  Lab Results  Component Value Date   TSH 2.05 01/21/2019   Lab Results  Component Value Date   WBC 7.9 01/21/2019   HGB 13.5 01/21/2019   HCT 40.4 01/21/2019   MCV 90.3 01/21/2019   PLT 285.0 01/21/2019   Lab Results  Component Value Date   NA 140 01/21/2019   K 4.0 01/21/2019   CO2 28 01/21/2019   GLUCOSE 76 01/21/2019    BUN 15 01/21/2019   CREATININE 1.10 03/19/2019   BILITOT 0.2 01/21/2019   ALKPHOS 84 01/21/2019   AST 17 01/21/2019   ALT  13 01/21/2019   PROT 6.9 01/21/2019   ALBUMIN 4.6 01/21/2019   CALCIUM 9.5 01/21/2019   ANIONGAP 7 11/26/2017   GFR 75.76 01/21/2019   Lab Results  Component Value Date   CHOL 248 (H) 05/20/2019   Lab Results  Component Value Date   HDL 37.60 (L) 05/20/2019   No results found for: [facility] Results  Component Value Date   TRIG 258.0 (H) 05/20/2019   Lab Results  Component Value Date   CHOLHDL 7 05/20/2019   Lab Results  Component Value Date   HGBA1C 6.2 05/20/2019      Assessment & Plan:   Problem List Items Addressed This Visit      Other   Localized swelling of right lower leg - Primary   Right knee pain     Advised to stop using the knee tight band as it made your lower leg fluids swell up some. It functioned like a tourniquet. No  abnormality of knee, leg, calf, ankle, foot  today. If he needs a knee brace- use the wide one elastic one. Full normal ROM of knee. No pain or tenderness.   Consider orthopedic evaluation of his  knee that he reports gets dislocated at times. Knee exam was normal today. He will think about it. He has an appt in June with his PCP, Joycelyn Schmid to further address.    I recommend the Covid vaccine [employer]. Call [phone]. He had questions about the safety of this vaccine today that we discussed.   No orders of the defined types were placed in this encounter.  A total of 20 minutes was spent with patient more than half of which was spent in counseling patient on the above mentioned issues.  Follow-up: Return if symptoms worsen or fail to improve.  This visit occurred during the SARS-CoV-2 public health emergency.  Safety protocols were in place, including screening questions prior to the visit, additional usage of staff PPE, and extensive cleaning of exam room while observing appropriate contact time as indicated  for disinfecting solutions.    Denice Paradise, NP

## 2019-05-28 DIAGNOSIS — R2241 Localized swelling, mass and lump, right lower limb: Secondary | ICD-10-CM | POA: Insufficient documentation

## 2019-05-28 DIAGNOSIS — M25561 Pain in right knee: Secondary | ICD-10-CM | POA: Insufficient documentation

## 2019-05-28 NOTE — Telephone Encounter (Signed)
PFT R/S for Tues 06/17/2019 at 2:00 pm at [facility]. COVID test scheduled for Friday 06/14/2019 at [facility] between hours of 8:00 am to 1:00 pm.  LMOVM for pt of above appointments and information mailed to patient along with instructions for PFT and information about the COVID Test. Asked patient to please return my call to acknowledge that he received this message.  Nothing else needed at this time. Rhonda J Cobb

## 2019-05-28 NOTE — Assessment & Plan Note (Signed)
He reports RT  knee gets dislocated and he has to use a brace- recommended ORTHO devaluation. He will consider. Advised to discontinue using narrow strap as a brace tightly applied under the knee as it functions like a tourniquet.

## 2019-05-31 ENCOUNTER — Ambulatory Visit: Payer: Managed Care, Other (non HMO) | Admitting: Family

## 2019-05-31 ENCOUNTER — Telehealth (INDEPENDENT_AMBULATORY_CARE_PROVIDER_SITE_OTHER): Payer: Managed Care, Other (non HMO) | Admitting: Family

## 2019-05-31 DIAGNOSIS — G8929 Other chronic pain: Secondary | ICD-10-CM | POA: Diagnosis not present

## 2019-05-31 DIAGNOSIS — M542 Cervicalgia: Secondary | ICD-10-CM

## 2019-05-31 DIAGNOSIS — R1013 Epigastric pain: Secondary | ICD-10-CM

## 2019-05-31 MED ORDER — PREDNISONE 10 MG PO TABS: ORAL_TABLET | ORAL | 0 refills | Status: DC

## 2019-05-31 MED ORDER — DEXILANT 30 MG PO CPDR: 30.0000 mg | DELAYED_RELEASE_CAPSULE | Freq: Every day | ORAL | 2 refills | Status: DC

## 2019-05-31 NOTE — Progress Notes (Signed)
Virtual Visit via Video Note  I connected with@  on 06/04/19 at 12:00 PM EDT by a video enabled telemedicine application and verified that I am speaking with the correct person using two identifiers.  Location patient: home Location provider:work Persons participating in the virtual visit: patient, provider  I discussed the limitations of evaluation and management by telemedicine and the availability of in person appointments. The patient expressed understanding and agreed to proceed.   HPI:  Complains epigastric pain over the past week, comes and goes.  NO pain today. States that he will be fine for 5-6 weeks and then pain will recur. For over a year has had pain the epigastric and right side of chest. This pain feels similar to prior presentations.  No new activity or food that may have trigger. Has been taking BC powder and also ibuprofen for chronic neck pain. Drinks pepsi, 3-4 times every day. Lifting with work however no more than usual.  Would like prednisone as very helpful for neck pain 3 months ago with Dr Sharlet Salina.   Taking nexium and pecpdi BID.    No regurgitation, vomiting, exertional chest pain or pressure, numbness or tingling radiating to left arm or jaw, palpitations, dizziness, frequent headaches, changes in vision, or shortness of breath.   Never started PT for neck pain as ordered Dr Georgina Snell due to work schedule however plans too once works hires more people.   ROS: See pertinent positives and negatives per HPI.  Past Medical History:  Diagnosis Date  . Arthritis   . Hypertension     No past surgical history on file.  Family History  Problem Relation Age of Onset  . Hyperlipidemia Mother   . Hyperlipidemia Father   . Hearing loss Father   . Diabetes Father   . Arthritis Father       Current Outpatient Medications:  .  cyclobenzaprine (FLEXERIL) 10 MG tablet, TAKE ONE TABLET BY MOUTH THREE TIMES A DAY AS NEEDED FOR MUSCLE SPASMS, Disp: 30 tablet, Rfl:  1 .  Dexlansoprazole (DEXILANT) 30 MG capsule, Take 1 capsule (30 mg total) by mouth daily., Disp: 30 capsule, Rfl: 2 .  famotidine (PEPCID) 20 MG tablet, Take 20 mg by mouth 2 (two) times daily., Disp: , Rfl:  .  metoprolol succinate (TOPROL-XL) 25 MG 24 hr tablet, Take 1 tablet by mouth daily., Disp: , Rfl:  .  predniSONE (DELTASONE) 10 MG tablet, Take 40 mg by mouth on day 1, then taper 10 mg daily until gone, Disp: 10 tablet, Rfl: 0 .  pregabalin (LYRICA) 150 MG capsule, TAKE ONE CAPSULE BY MOUTH THREE TIMES A DAY, Disp: 90 capsule, Rfl: 1 .  Tiotropium Bromide-Olodaterol (STIOLTO RESPIMAT) 2.5-2.5 MCG/ACT AERS, Inhale 2 puffs into the lungs daily., Disp: 4 g, Rfl: 0 .  traMADol (ULTRAM) 50 MG tablet, TAKE TWO TABLETS BY MOUTH EVERY 12 HOURS AS NEEDED FOR PAIN, Disp: 120 tablet, Rfl: 1  EXAM:  VITALS per patient if applicable:  GENERAL: alert, oriented, appears well and in no acute distress  HEENT: atraumatic, conjunttiva clear, no obvious abnormalities on inspection of external nose and ears  NECK: normal movements of the head and neck  LUNGS: on inspection no signs of respiratory distress, breathing rate appears normal, no obvious gross SOB, gasping or wheezing  CV: no obvious cyanosis  MS: moves all visible extremities without noticeable abnormality  PSYCH/NEURO: pleasant and cooperative, no obvious depression or anxiety, speech and thought processing grossly intact  ASSESSMENT AND PLAN:  Discussed  the following assessment and plan:  Epigastric pain - Plan: Dexlansoprazole (DEXILANT) 30 MG capsule  Chronic neck pain - Plan: predniSONE (DELTASONE) 10 MG tablet Problem List Items Addressed This Visit      Other   Chronic neck pain    Acute on chronic. Agreeable to short prednisone taper.Advised of side effects of frequent use of prednisone and we agreed to limit use.  Pending rheumatology appointment as well.       Relevant Medications   predniSONE (DELTASONE) 10 MG  tablet   Epigastric pain - Primary    Difficult to discern if musculoskeletal versus GI related. We have also discussed his use of NSAID, caffeine use and risk of exacerbation of gerd. Advised to stop nsaid, caffeine. Stop nexium and trial of dexilant to see if more helpful. Close follow up.       Relevant Medications   Dexlansoprazole (DEXILANT) 30 MG capsule      -we discussed possible serious and likely etiologies, options for evaluation and workup, limitations of telemedicine visit vs in person visit, treatment, treatment risks and precautions. Pt prefers to treat via telemedicine empirically rather then risking or undertaking an in person visit at this moment. Patient agrees to seek prompt in person care if worsening, new symptoms arise, or if is not improving with treatment.   I discussed the assessment and treatment plan with the patient. The patient was provided an opportunity to ask questions and all were answered. The patient agreed with the plan and demonstrated an understanding of the instructions.   The patient was advised to call back or seek an in-person evaluation if the symptoms worsen or if the condition fails to improve as anticipated.   [name], FNP

## 2019-05-31 NOTE — Patient Instructions (Addendum)
Stop nexium Start dexilant  STOP Pepsi, ibuprofen, goodys powder.   Start prednisone tomorrow ( Saturday)

## 2019-06-04 NOTE — Assessment & Plan Note (Signed)
Acute on chronic. Agreeable to short prednisone taper.Advised of side effects of frequent use of prednisone and we agreed to limit use.  Pending rheumatology appointment as well.

## 2019-06-04 NOTE — Assessment & Plan Note (Signed)
Difficult to discern if musculoskeletal versus GI related. We have also discussed his use of NSAID, caffeine use and risk of exacerbation of gerd. Advised to stop nsaid, caffeine. Stop nexium and trial of dexilant to see if more helpful. Close follow up.

## 2019-06-12 ENCOUNTER — Telehealth: Payer: Self-pay

## 2019-06-12 NOTE — Telephone Encounter (Signed)
Pt is aware of date/time of covid test prior to PFT. °06/14/2019 prior to 1:00 at [facility].  °

## 2019-06-13 ENCOUNTER — Other Ambulatory Visit: Payer: Self-pay | Admitting: Family Medicine

## 2019-06-14 ENCOUNTER — Other Ambulatory Visit: Admission: RE | Admit: 2019-06-14 | Payer: Managed Care, Other (non HMO) | Source: Ambulatory Visit

## 2019-06-14 ENCOUNTER — Encounter: Payer: Self-pay | Admitting: Family

## 2019-06-14 NOTE — Telephone Encounter (Signed)
LMOVM for [name] in Cardiopulmonary to cancel PFT for Monday 06/17/2019 at 2:15 due to patient no showing up for COVID Test. Pt has been scheduled x 3.  LMOVM for pt that the PFT for Monday 06/17/2019 has been canceled due to him not having his COVID Test. Philip Stephens

## 2019-06-14 NOTE — Telephone Encounter (Signed)
It does not appear that pt showed for covid test.  Lm to verify this with pt.  PFT will need to be rescheduled.

## 2019-06-17 ENCOUNTER — Ambulatory Visit: Payer: Managed Care, Other (non HMO)

## 2019-06-21 ENCOUNTER — Telehealth: Payer: Self-pay | Admitting: Family

## 2019-06-21 ENCOUNTER — Ambulatory Visit (INDEPENDENT_AMBULATORY_CARE_PROVIDER_SITE_OTHER): Payer: Managed Care, Other (non HMO) | Admitting: Family

## 2019-06-21 ENCOUNTER — Other Ambulatory Visit: Payer: Self-pay

## 2019-06-21 VITALS — BP 122/80 | HR 77 | Temp 96.5°F | Resp 16 | Wt 165.4 lb

## 2019-06-21 DIAGNOSIS — M542 Cervicalgia: Secondary | ICD-10-CM | POA: Diagnosis not present

## 2019-06-21 DIAGNOSIS — G8929 Other chronic pain: Secondary | ICD-10-CM

## 2019-06-21 DIAGNOSIS — M545 Low back pain, unspecified: Secondary | ICD-10-CM

## 2019-06-21 DIAGNOSIS — R1013 Epigastric pain: Secondary | ICD-10-CM

## 2019-06-21 NOTE — Assessment & Plan Note (Signed)
Discussed with patient working diagnosis of exacerbating GERD, emphasized follow-up with GI.  He will continue Nexium, Pepcid, Carafate at this time.  Advised he is due for colonoscopy in a  couple months ( 44 yo)  and counseled him on having EGD with colonoscopy at that time.  Patient was understanding and he will make an appointment for this

## 2019-06-21 NOTE — Assessment & Plan Note (Signed)
Overall at baseline.  Patient has a signed controlled contract in place, pending urine drug screen as well.  I counseled him extensively today and asked not to take nonprescribed narcotics by me as he had an old prescription.  Patient is very much understanding to this; he would like  to seeing pain management which I think is very appropriate. Referral placed.

## 2019-06-21 NOTE — Patient Instructions (Addendum)
Call [facility] and make an appointment for colonscopy and upper scope as you will be 44 years old. Stay on nexium, carfate, and pepcid.   May increase tramadol to 2 tablets in the morning and 2 in the afternoon; you may take one extra dose during the day for breakthrough  Referral to pain management  Let us know if you dont hear back within a week in regards to an appointment being scheduled.    Let me know how you are doing

## 2019-06-21 NOTE — Assessment & Plan Note (Addendum)
Differential for pain continues to include musculoskeletal etiology.  He has had extensive work-up with a CT angiogram, MRI cervical, thoracic, lumbar, upper GI studies, right upper quadrant ultrasound.  Discussed at length with patient to consider EMG studies, or the potential for treatment with rhizotomy under pain management, may be very therapeutic for him.  I think his pain is complicated by exacerbations of GERD (recent dietary indiscretion).  We discussed pain management versus neurology.  Of note patient would like stronger pain medication which I think is appropriate. I advised him to start with pain management at this time.  We will certainly continue to consider neurology consult as well. I have given him a prescription to take at maximum 250 mg of tramadol per day, this will include an extra dose 50 mg if needed. Urine drug screen today. I looked up patient on [place] Controlled Substances Reporting System PMP AWARE and saw no activity that raised concern of inappropriate use.

## 2019-06-21 NOTE — Progress Notes (Signed)
Subjective:    Patient ID: Philip Stephens, male    DOB: October 19, 1975, 44 y.o.   MRN: [id number]  CC: [name] is a 44 y.o. male who presents today for follow up.   HPI: Pain right sided and radiates to upper back and to posterior neck, better overall in last year.  He feels that this is 'gastro'. Its a dull pain which can increase in severity. No numbness on chest, rash. Continues to have bilateral numbness in hands. Tramadol is not working as well with pain as it had.  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  Feels he may need to see pain management.   Worsened yesterday when ate pizza, burger.  Started back nexium 40mg  BID, takes with pepcid ac. Has added back carafate. Never had EGD.  Took an old hydrocodone last night with complete relief pain. Had 2 tablets ( out of 30)  from 2 years ago when when lifted hitch and injured his low back. He had used the other other remaining tablet a couple of weeks ago with resolution of pain. He is starting to reconsider the use of narcotics for his pain and would like to have something as needed. He requests to be able to take one extra dose of tramadol as he had been on 300mg  day/total in the past.  No exertional chest pain, left arm numbness, sob.   Plans to start using hot tub.   Low back- chronic however feels well controlled.    Seeing Dr on Monday.   Not smoking cigarrettes Has been smoking a cigar lately.  b12 elevated 2020    HISTORY:  Past Medical History:  Diagnosis Date  . Arthritis   . Hypertension    No past surgical history on file. Family History  Problem Relation Age of Onset  . Hyperlipidemia Mother   . Hyperlipidemia Father   . Hearing loss Father   . Diabetes Father   . Arthritis Father     Allergies: Penicillins Current Outpatient Medications on File Prior to Visit  Medication Sig Dispense Refill  . cyclobenzaprine (FLEXERIL) 10 MG tablet TAKE ONE TABLET BY MOUTH THREE TIMES A DAY AS  NEEDED FOR MUSCLE SPASMS 30 tablet 1  . famotidine (PEPCID) 20 MG tablet Take 20 mg by mouth 2 (two) times daily.    . metoprolol succinate (TOPROL-XL) 25 MG 24 hr tablet Take 1 tablet by mouth daily.    . pregabalin (LYRICA) 150 MG capsule TAKE ONE CAPSULE BY MOUTH THREE TIMES A DAY 90 capsule 1  . Tiotropium Bromide-Olodaterol (STIOLTO RESPIMAT) 2.5-2.5 MCG/ACT AERS Inhale 2 puffs into the lungs daily. 4 g 0  . traMADol (ULTRAM) 50 MG tablet TAKE TWO TABLETS BY MOUTH EVERY 12 HOURS AS NEEDED FOR PAIN 120 tablet 1   No current facility-administered medications on file prior to visit.    Social History   Tobacco Use  . Smoking status: Former Smoker    Packs/day: 2.00    Years: 28.00    Pack years: 56.00    Types: Cigarettes    Quit date: 02/19/2018    Years since quitting: 1.3  . Smokeless tobacco: Former Wednesday  . Tobacco comment: Does Velo. ( has nicotine , no tobacco).   Vaping Use  . Vaping Use: Never assessed  Substance Use Topics  . Alcohol use: Not Currently  . Drug use: Never    Review of Systems  Constitutional: Negative for chills and fever.  Eyes: Negative  for visual disturbance.  Respiratory: Negative for cough.   Cardiovascular: Negative for chest pain and palpitations.  Gastrointestinal: Negative for abdominal distention, abdominal pain, constipation, nausea and vomiting.  Musculoskeletal: Positive for back pain and neck pain.  Neurological: Positive for numbness. Negative for headaches.      Objective:    BP 122/80   Pulse 77   Temp (!) 96.5 F (35.8 C)   Resp 16   Wt 165 lb 6.4 oz (75 kg)   SpO2 98%   BMI 21.24 kg/m  BP Readings from Last 3 Encounters:  06/21/19 122/80  05/27/19 130/84  04/22/19 116/78   Wt Readings from Last 3 Encounters:  06/21/19 165 lb 6.4 oz (75 kg)  05/27/19 167 lb (75.8 kg)  05/17/19 168 lb (76.2 kg)    Physical Exam Vitals reviewed.  Constitutional:      Appearance: He is well-developed.  Cardiovascular:     Rate  and Rhythm: Regular rhythm.     Heart sounds: Normal heart sounds.  Pulmonary:     Effort: Pulmonary effort is normal. No respiratory distress.     Breath sounds: Normal breath sounds. No wheezing, rhonchi or rales.  Chest:     Chest wall: No mass or swelling.       Comments: Diffuse area of pain as noted by  patient right-sided chest wall.  No pain with palpation.  No rash, edema, deformity Musculoskeletal:     Right shoulder: Normal. No bony tenderness. Normal range of motion. Normal strength.     Left shoulder: Normal. No bony tenderness. Normal range of motion. Normal strength.       Arms:     Cervical back: Tenderness present. No edema, erythema or bony tenderness. Normal range of motion.       Back:     Comments: Trapezius feels tight. Discomfort noted with palpation as noted on diagram. No rash  Skin:    General: Skin is warm and dry.  Neurological:     Mental Status: He is alert.  Psychiatric:        Speech: Speech normal.        Behavior: Behavior normal.        Assessment & Plan:   Problem List Items Addressed This Visit      Other   Chronic midline low back pain without sciatica    Overall at baseline.  Patient has a signed controlled contract in place, pending urine drug screen as well.  I counseled him extensively today and asked not to take nonprescribed narcotics by me as he had an old prescription.  Patient is very much understanding to this; he would like  to seeing pain management which I think is very appropriate. Referral placed.       Chronic neck pain - Primary    Differential for pain continues to include musculoskeletal etiology.  He has had extensive work-up with a CT angiogram, MRI cervical, thoracic, lumbar, upper GI studies, right upper quadrant ultrasound.  Discussed at length with patient to consider EMG studies, or the potential for treatment with rhizotomy under pain management, may be very therapeutic for him.  I think his pain is complicated  by exacerbations of GERD (recent dietary indiscretion).  We discussed pain management versus neurology.  Of note patient would like stronger pain medication which I think is appropriate. I advised him to start with pain management at this time.  We will certainly continue to consider neurology consult as well. I have given him  a prescription to take at maximum 250 mg of tramadol per day, this will include an extra dose 50 mg if needed. Urine drug screen today. I looked up patient on [place] Controlled Substances Reporting System PMP AWARE and saw no activity that raised concern of inappropriate use.        Relevant Orders   Ambulatory referral to Pain Clinic   PRESCRIBED DRUGS,MEDMATCH(R)   DRUG MONITORING, PANEL 8 WITH CONFIRMATION, URINE   Epigastric pain    Discussed with patient working diagnosis of exacerbating GERD, emphasized follow-up with GI.  He will continue Nexium, Pepcid, Carafate at this time.  Advised he is due for colonoscopy in a  couple months ( 44 yo)  and counseled him on having EGD with colonoscopy at that time.  Patient was understanding and he will make an appointment for this          I have discontinued Clements C. Persico's Dexilant and predniSONE. I am also having him maintain his metoprolol succinate, cyclobenzaprine, Stiolto Respimat, famotidine, traMADol, and pregabalin.   No orders of the defined types were placed in this encounter.   Return precautions given.   Risks, benefits, and alternatives of the medications and treatment plan prescribed today were discussed, and patient expressed understanding.   Education regarding symptom management and diagnosis given to patient on AVS.  Continue to follow with Allegra Grana, FNP for routine health maintenance.   [name] and I agreed with plan.   [name], FNP

## 2019-06-21 NOTE — Telephone Encounter (Signed)
lvm pcp wants him to come back in 1 month.

## 2019-06-24 DIAGNOSIS — M542 Cervicalgia: Secondary | ICD-10-CM | POA: Insufficient documentation

## 2019-06-24 DIAGNOSIS — M2550 Pain in unspecified joint: Secondary | ICD-10-CM | POA: Insufficient documentation

## 2019-06-24 DIAGNOSIS — R768 Other specified abnormal immunological findings in serum: Secondary | ICD-10-CM | POA: Insufficient documentation

## 2019-06-24 LAB — DRUG MONITORING, PANEL 8 WITH CONFIRMATION, URINE
6 Acetylmorphine: NEGATIVE ng/mL (ref <10)
Alcohol Metabolites: NEGATIVE ng/mL
Amphetamines: NEGATIVE ng/mL (ref <500)
Benzodiazepines: NEGATIVE ng/mL (ref <100)
Buprenorphine, Urine: NEGATIVE ng/mL (ref <5)
Buprenorphine: NEGATIVE ng/mL (ref <2)
Cocaine Metabolite: NEGATIVE ng/mL (ref <150)
Creatinine: 163.5 mg/dL
MDMA: NEGATIVE ng/mL (ref <500)
Marijuana Metabolite: NEGATIVE ng/mL (ref <20)
Naloxone: NEGATIVE ng/mL (ref <2)
Norbuprenorphine: NEGATIVE ng/mL (ref <2)
Opiates: NEGATIVE ng/mL (ref <100)
Oxidant: NEGATIVE ug/mL
Oxycodone: NEGATIVE ng/mL (ref <100)
pH: 5.7 (ref 4.5–9.0)

## 2019-06-24 LAB — PRESCRIBED DRUGS,MEDMATCH(R)

## 2019-06-24 LAB — DM TEMPLATE

## 2019-06-26 ENCOUNTER — Other Ambulatory Visit: Payer: Self-pay | Admitting: Family

## 2019-06-26 DIAGNOSIS — M545 Low back pain, unspecified: Secondary | ICD-10-CM

## 2019-06-26 DIAGNOSIS — G8929 Other chronic pain: Secondary | ICD-10-CM

## 2019-06-26 MED ORDER — TRAMADOL HCL 50 MG PO TABS: ORAL_TABLET | ORAL | 1 refills | Status: DC

## 2019-06-26 NOTE — Progress Notes (Signed)
I looked up patient on [place] Controlled Substances Reporting System PMP AWARE and saw no activity that raised concern of inappropriate use.   

## 2019-07-10 ENCOUNTER — Telehealth: Payer: Self-pay

## 2019-07-10 NOTE — Telephone Encounter (Signed)
Called patient over Advice request. Philip Stephens asks if he should still try and see pain management. Philip Stephens was confused over when his appointment was and believed it to be 07/21/19. He states that if it was 08/20/19 he does not need to change it and wants to keep that appointment time.

## 2019-07-11 NOTE — Telephone Encounter (Signed)
noted 

## 2019-07-17 ENCOUNTER — Telehealth: Payer: Self-pay

## 2019-07-17 NOTE — Telephone Encounter (Signed)
Attempted to call patient and schedule a closer appointment to discuss patient advice request. Could not leave voicemail as call could not be completed. Sent a patient message through [employer] requesting Philip Stephens call back and schedule.

## 2019-07-19 ENCOUNTER — Telehealth: Payer: Self-pay

## 2019-07-19 NOTE — Telephone Encounter (Signed)
Called patient and scheduled for 07/22/19 at 10:00am for a  Sooner appointment

## 2019-07-22 ENCOUNTER — Ambulatory Visit: Payer: Managed Care, Other (non HMO) | Admitting: Family

## 2019-07-22 ENCOUNTER — Other Ambulatory Visit: Payer: Self-pay | Admitting: Family

## 2019-07-22 DIAGNOSIS — G8929 Other chronic pain: Secondary | ICD-10-CM

## 2019-07-22 DIAGNOSIS — M545 Low back pain, unspecified: Secondary | ICD-10-CM

## 2019-07-22 NOTE — Telephone Encounter (Signed)
Refill request for lyrica, last seen 06-26-19, last filled 05-20-19.  Please advise.

## 2019-07-25 ENCOUNTER — Other Ambulatory Visit: Payer: Self-pay

## 2019-07-25 ENCOUNTER — Encounter: Payer: Self-pay | Admitting: Family

## 2019-07-25 ENCOUNTER — Ambulatory Visit (INDEPENDENT_AMBULATORY_CARE_PROVIDER_SITE_OTHER): Payer: Managed Care, Other (non HMO) | Admitting: Family

## 2019-07-25 DIAGNOSIS — R079 Chest pain, unspecified: Secondary | ICD-10-CM

## 2019-07-25 DIAGNOSIS — G8929 Other chronic pain: Secondary | ICD-10-CM

## 2019-07-25 DIAGNOSIS — M545 Low back pain, unspecified: Secondary | ICD-10-CM

## 2019-07-25 DIAGNOSIS — K219 Gastro-esophageal reflux disease without esophagitis: Secondary | ICD-10-CM

## 2019-07-25 MED ORDER — ESOMEPRAZOLE MAGNESIUM 20 MG PO CPDR
20.0000 mg | DELAYED_RELEASE_CAPSULE | Freq: Two times a day (BID) | ORAL | 1 refills | Status: DC
Start: 2019-07-25 — End: 2021-06-01

## 2019-07-25 NOTE — Progress Notes (Signed)
Subjective:    Patient ID: Philip Stephens, male    DOB: 1975-08-01, 44 y.o.   MRN: [id number]  CC: [name] is a 44 y.o. male who presents today for follow up.   HPI: Follow up  Right sided chest wall pain 'almost completely gone' since starting taking phyllanthus niruri extract ; supplement that was recommended by friend with a history of gallstones. Right sided chest wall pain can feel like stab or ache, with occasional radiation to right scapula. Pain wasn't consistent with meals or particular foods.   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 Able to ride riding lawn mover without pain this past week.   Previously had epigastric bloating with meals, however since resolved since starting extract. This bloating would resolve with burping.   No fever, weight loss, constipation.   GERD- 20mg  nexium BID,compliant with carafate. Has stopped smoking cigars, BC powder. No dyspepsia, epigastric burning.   Low back pain- resolved. Feeling better with hot tub. Doing well , improved. Taking extra 50 mg  Tramadol as needed with relief.   Upcoming appointment with pain management 08/08/19  ANA negative 06/24/19 with Dr 06/26/19  HISTORY:  Past Medical History:  Diagnosis Date  . Arthritis   . Hypertension    No past surgical history on file. Family History  Problem Relation Age of Onset  . Hyperlipidemia Mother   . Hyperlipidemia Father   . Hearing loss Father   . Diabetes Father   . Arthritis Father     Allergies: Penicillins Current Outpatient Medications on File Prior to Visit  Medication Sig Dispense Refill  . Cholecalciferol 25 MCG (1000 UT) capsule Take by mouth.    . cyclobenzaprine (FLEXERIL) 10 MG tablet TAKE ONE TABLET BY MOUTH THREE TIMES A DAY AS NEEDED FOR MUSCLE SPASMS 30 tablet 1  . famotidine (PEPCID) 20 MG tablet Take 20 mg by mouth 2 (two) times daily.    . metoprolol succinate (TOPROL-XL) 25 MG 24 hr tablet Take 1  tablet by mouth daily.    . Multiple Vitamin (MULTI-VITAMIN) tablet Take 1 tablet by mouth daily.    . pregabalin (LYRICA) 150 MG capsule TAKE ONE CAPSULE BY MOUTH THREE TIMES A DAY 90 capsule 1  . sucralfate (CARAFATE) 1 g tablet Take 1 g by mouth 2 (two) times daily.    . Tiotropium Bromide-Olodaterol (STIOLTO RESPIMAT) 2.5-2.5 MCG/ACT AERS Inhale 2 puffs into the lungs daily. 4 g 0  . traMADol (ULTRAM) 50 MG tablet TAKE TWO TABLETS BY MOUTH EVERY 12 HOURS AS NEEDED FOR PAIN. May take an additional tablet once per day prn for breakthrough pain. 150 tablet 1   No current facility-administered medications on file prior to visit.    Social History   Tobacco Use  . Smoking status: Former Smoker    Packs/day: 2.00    Years: 28.00    Pack years: 56.00    Types: Cigarettes    Quit date: 02/19/2018    Years since quitting: 1.4  . Smokeless tobacco: Former 02/21/2018  . Tobacco comment: Does Velo. ( has nicotine , no tobacco).   Vaping Use  . Vaping Use: Never assessed  Substance Use Topics  . Alcohol use: Not Currently  . Drug use: Never    Review of Systems  Constitutional: Negative for chills and fever.  Respiratory: Negative for cough.   Cardiovascular: Negative for chest pain and palpitations.  Gastrointestinal: Negative for nausea and vomiting.  Objective:    BP 108/82 (BP Location: Left Arm, Patient Position: Sitting)   Pulse 83   Temp 98 F (36.7 C)   Ht 6' 2.02" (1.88 m)   Wt 163 lb (73.9 kg)   SpO2 96%   BMI 20.92 kg/m  BP Readings from Last 3 Encounters:  07/25/19 108/82  06/21/19 122/80  05/27/19 130/84   Wt Readings from Last 3 Encounters:  07/25/19 163 lb (73.9 kg)  06/21/19 165 lb 6.4 oz (75 kg)  05/27/19 167 lb (75.8 kg)    Physical Exam Vitals reviewed.  Constitutional:      Appearance: Normal appearance. He is well-developed.  Cardiovascular:     Rate and Rhythm: Regular rhythm.     Heart sounds: Normal heart sounds.  Pulmonary:     Effort:  Pulmonary effort is normal. No respiratory distress.     Breath sounds: Normal breath sounds. No wheezing or rales.  Abdominal:     General: Bowel sounds are normal. There is no distension.     Palpations: Abdomen is soft. Abdomen is not rigid. There is no fluid wave or mass.     Tenderness: There is no abdominal tenderness. There is no guarding or rebound. Negative signs include Murphy's sign and McBurney's sign.  Skin:    General: Skin is warm and dry.  Neurological:     Mental Status: He is alert.  Psychiatric:        Speech: Speech normal.        Behavior: Behavior normal.        Assessment & Plan:   Problem List Items Addressed This Visit      Digestive   Gastroesophageal reflux disease    Stable on regimen. Patient recently decreased nexium. Advised him to monitor for breakthrough symptoms and continue to abstain from alcohol, cigars, and BC powder.  Of note, he is due for colonoscopy when 44 yo and I advised him to call [facility] and schedule this.       Relevant Medications   sucralfate (CARAFATE) 1 g tablet   esomeprazole (NEXIUM) 20 MG capsule     Other   Chronic midline low back pain without sciatica    Chronic, stable. Continue current regimen including tramadol 50mg  for breakthrough pain. Upcoming pain management appointment.       Right-sided chest pain    Largely resolved. No pain today. Patient feels strongly may be related to potential gallstones which I agreed we should consider on differential. He declined pursuing HIDA scan to day and since he has started a herbal supplement. Advised I wasn't familiar of supplement ( phyllanthus niruri) he was on or know of data to support its use. If pain recurs, we will pursue HIDA scan. Advised to keep upcoming pain management appointment. He declines further evaluation at this time. Close follow up          I have discontinued Hendry C. Shankar's esomeprazole. I am also having him start on esomeprazole. Additionally, I  am having him maintain his metoprolol succinate, cyclobenzaprine, Stiolto Respimat, famotidine, traMADol, pregabalin, Multi-Vitamin, sucralfate, and Cholecalciferol.   Meds ordered this encounter  Medications  . esomeprazole (NEXIUM) 20 MG capsule    Sig: Take 1 capsule (20 mg total) by mouth 2 (two) times daily before a meal.    Dispense:  120 capsule    Refill:  1    Order Specific Question:   Supervising Provider    Answer:   L [[id number]]  Return precautions given.   Risks, benefits, and alternatives of the medications and treatment plan prescribed today were discussed, and patient expressed understanding.   Education regarding symptom management and diagnosis given to patient on AVS.  Continue to follow with Allegra Grana, FNP for routine health maintenance.   [name] and I agreed with plan.   [name], FNP

## 2019-07-25 NOTE — Assessment & Plan Note (Addendum)
Stable on regimen. Patient recently decreased nexium. Advised him to monitor for breakthrough symptoms and continue to abstain from alcohol, cigars, and BC powder.  Of note, he is due for colonoscopy when 44 yo and I advised him to call [facility] and schedule this.

## 2019-07-25 NOTE — Patient Instructions (Signed)
Let me know if pain recurs and we can pursue HIDA scan  Nice to see you!

## 2019-07-25 NOTE — Assessment & Plan Note (Addendum)
Largely resolved. No pain today. Patient feels strongly may be related to potential gallstones which I agreed we should consider on differential. He declined pursuing HIDA scan to day and since he has started a herbal supplement. Advised I wasn't familiar of supplement ( phyllanthus niruri) he was on or know of data to support its use. If pain recurs, we will pursue HIDA scan. Advised to keep upcoming pain management appointment. He declines further evaluation at this time. Close follow up

## 2019-07-25 NOTE — Assessment & Plan Note (Signed)
Chronic, stable. Continue current regimen including tramadol 50mg  for breakthrough pain. Upcoming pain management appointment.

## 2019-08-03 ENCOUNTER — Other Ambulatory Visit: Payer: Self-pay | Admitting: Family

## 2019-08-03 DIAGNOSIS — M545 Low back pain, unspecified: Secondary | ICD-10-CM

## 2019-08-03 DIAGNOSIS — G8929 Other chronic pain: Secondary | ICD-10-CM

## 2019-08-06 ENCOUNTER — Other Ambulatory Visit: Payer: Self-pay | Admitting: Family

## 2019-08-06 DIAGNOSIS — R1013 Epigastric pain: Secondary | ICD-10-CM

## 2019-08-06 NOTE — Progress Notes (Signed)
hida ordered

## 2019-08-08 ENCOUNTER — Ambulatory Visit: Payer: Managed Care, Other (non HMO) | Admitting: Student in an Organized Health Care Education/Training Program

## 2019-08-09 ENCOUNTER — Telehealth: Payer: Self-pay

## 2019-08-09 NOTE — Telephone Encounter (Signed)
Patient returned phone call and states he has an appointment on 08/20/19

## 2019-08-09 NOTE — Telephone Encounter (Signed)
Patient returned phone call and already had an appointment scheduled for 8.17.21

## 2019-08-09 NOTE — Telephone Encounter (Signed)
Left message to call back  

## 2019-08-09 NOTE — Telephone Encounter (Signed)
Called patient to schedule a Follow Up with [name]. Left a voicemail to call back and schedule.

## 2019-08-12 ENCOUNTER — Other Ambulatory Visit: Payer: Self-pay | Admitting: Family

## 2019-08-12 DIAGNOSIS — R079 Chest pain, unspecified: Secondary | ICD-10-CM

## 2019-08-20 ENCOUNTER — Other Ambulatory Visit: Payer: Self-pay

## 2019-08-20 ENCOUNTER — Ambulatory Visit (INDEPENDENT_AMBULATORY_CARE_PROVIDER_SITE_OTHER): Payer: Managed Care, Other (non HMO) | Admitting: Family

## 2019-08-20 ENCOUNTER — Encounter: Payer: Self-pay | Admitting: Family

## 2019-08-20 VITALS — BP 124/82 | HR 84 | Temp 98.2°F | Resp 15 | Ht 74.0 in | Wt 162.0 lb

## 2019-08-20 DIAGNOSIS — M545 Low back pain, unspecified: Secondary | ICD-10-CM

## 2019-08-20 DIAGNOSIS — G8929 Other chronic pain: Secondary | ICD-10-CM | POA: Diagnosis not present

## 2019-08-20 DIAGNOSIS — F419 Anxiety disorder, unspecified: Secondary | ICD-10-CM | POA: Diagnosis not present

## 2019-08-20 DIAGNOSIS — R079 Chest pain, unspecified: Secondary | ICD-10-CM

## 2019-08-20 MED ORDER — SERTRALINE HCL 50 MG PO TABS: 50.0000 mg | ORAL_TABLET | Freq: Every day | ORAL | 3 refills | Status: DC

## 2019-08-20 MED ORDER — TRAMADOL HCL 50 MG PO TABS: ORAL_TABLET | ORAL | 1 refills | Status: DC

## 2019-08-20 NOTE — Progress Notes (Signed)
Subjective:    Patient ID: Philip Stephens, male    DOB: 03-11-1975, 44 y.o.   MRN: [id number]  CC: Philip Stephens is a 44 y.o. male who presents today for follow up.   HPI:  Primary concern is anxiety. Would like to discuss medication for 'my nerves'. Endorses more irritability. Tried buspar, zoloft but not for long in the past.   Thinks that yesterday may have had an anxiety attack.  Trouble falling asleep and worrying over health. He thinks of worse case scenario with his health.  No Si/hi.  No depression.   No sob, cough, exertional chest pain, night sweats.   Right sided chest wall pain at right nipple has not recurred in 10 days. No sob, cp, abdominal pain. Neck pain has resolved. This has made him feel less anxious.   Plans  to reschedule HIDA scan as son had been sick  Seeing [employer] today and plans to discuss colonoscopy.   Takes metoprolol 12.5mg  for 'heart racing' and finds this to be helpful.   Chronic LBP- controlled on current regimen. Needs a refill so tramadol.       Dr Cherylann Ratel 09/10/19 Dr Renard Matter 10/28/19 HISTORY:  Past Medical History:  Diagnosis Date  . Arthritis   . Hypertension    History reviewed. No pertinent surgical history. Family History  Problem Relation Age of Onset  . Hyperlipidemia Mother   . Anxiety disorder Mother   . Hyperlipidemia Father   . Hearing loss Father   . Diabetes Father   . Arthritis Father     Allergies: Penicillins Current Outpatient Medications on File Prior to Visit  Medication Sig Dispense Refill  . Cholecalciferol 25 MCG (1000 UT) capsule Take by mouth.    . cyclobenzaprine (FLEXERIL) 10 MG tablet TAKE ONE TABLET BY MOUTH THREE TIMES A DAY AS NEEDED FOR MUSCLE SPASMS 30 tablet 1  . esomeprazole (NEXIUM) 20 MG capsule Take 1 capsule (20 mg total) by mouth 2 (two) times daily before a meal. 120 capsule 1  . famotidine (PEPCID) 20 MG tablet Take 20 mg by mouth 2 (two) times daily.    . metoprolol succinate  (TOPROL-XL) 25 MG 24 hr tablet Take 1 tablet by mouth daily.    . Multiple Vitamin (MULTI-VITAMIN) tablet Take 1 tablet by mouth daily.    . pregabalin (LYRICA) 150 MG capsule TAKE ONE CAPSULE BY MOUTH THREE TIMES A DAY 90 capsule 1  . sucralfate (CARAFATE) 1 g tablet Take 1 g by mouth 2 (two) times daily.    . Tiotropium Bromide-Olodaterol (STIOLTO RESPIMAT) 2.5-2.5 MCG/ACT AERS Inhale 2 puffs into the lungs daily. 4 g 0   No current facility-administered medications on file prior to visit.    Social History   Tobacco Use  . Smoking status: Former Smoker    Packs/day: 2.00    Years: 28.00    Pack years: 56.00    Types: Cigarettes    Quit date: 02/19/2018    Years since quitting: 1.5  . Smokeless tobacco: Former Neurosurgeon  . Tobacco comment: Does Velo. ( has nicotine , no tobacco).   Vaping Use  . Vaping Use: Never assessed  Substance Use Topics  . Alcohol use: Not Currently  . Drug use: Never    Review of Systems  Constitutional: Negative for chills and fever.  Respiratory: Negative for cough.   Cardiovascular: Negative for chest pain and palpitations.  Gastrointestinal: Negative for nausea and vomiting.  Musculoskeletal: Positive for back pain (chronic  low back pain).  Psychiatric/Behavioral: Negative for sleep disturbance and suicidal ideas. The patient is nervous/anxious.       Objective:    BP 124/82 (BP Location: Left Arm, Patient Position: Sitting, Cuff Size: Normal)   Pulse 84   Temp 98.2 F (36.8 C) (Oral)   Resp 15   Ht 6\' 2"  (1.88 m)   Wt 162 lb (73.5 kg)   SpO2 97%   BMI 20.80 kg/m  BP Readings from Last 3 Encounters:  08/20/19 124/82  07/25/19 108/82  06/21/19 122/80   Wt Readings from Last 3 Encounters:  08/20/19 162 lb (73.5 kg)  07/25/19 163 lb (73.9 kg)  06/21/19 165 lb 6.4 oz (75 kg)    Physical Exam Vitals reviewed.  Constitutional:      Appearance: He is well-developed.  Cardiovascular:     Rate and Rhythm: Regular rhythm.     Heart  sounds: Normal heart sounds.  Pulmonary:     Effort: Pulmonary effort is normal. No respiratory distress.     Breath sounds: Normal breath sounds. No wheezing, rhonchi or rales.  Chest:     Chest wall: No tenderness.     Comments: No chest wall tenderness with palpation.  Skin:    General: Skin is warm and dry.  Neurological:     Mental Status: He is alert.  Psychiatric:        Speech: Speech normal.        Behavior: Behavior normal.        Assessment & Plan:   Problem List Items Addressed This Visit      Other   Anxiety - Primary    Trial of zoloft. Close follow up.       Relevant Medications   sertraline (ZOLOFT) 50 MG tablet   Chronic midline low back pain without sciatica    Chronic stable. Continue regimen. Reiterated the importance of consult with pain management next month      Relevant Medications   traMADol (ULTRAM) 50 MG tablet   Right-sided chest pain    Resolved at this time. Has follow up with GI ( will follow) and pending HIDA scan. Advised to pursue EGD, colonoscopy this year. He stated that he will discuss this with GI          I am having Philip Stephens start on sertraline. I am also having him maintain his metoprolol succinate, Stiolto Respimat, famotidine, pregabalin, Multi-Vitamin, sucralfate, Cholecalciferol, esomeprazole, cyclobenzaprine, and traMADol.   Meds ordered this encounter  Medications  . traMADol (ULTRAM) 50 MG tablet    Sig: TAKE TWO TABLETS BY MOUTH EVERY 12 HOURS AS NEEDED FOR PAIN. May take an additional tablet once per day prn for breakthrough pain.    Dispense:  150 tablet    Refill:  1    Fill on or after 08/24/19    Order Specific Question:   Supervising Provider    Answer:   08/26/19 L [[id number]]  . sertraline (ZOLOFT) 50 MG tablet    Sig: Take 1 tablet (50 mg total) by mouth at bedtime.    Dispense:  90 tablet    Refill:  3    Order Specific Question:   Supervising Provider    Answer:   Duncan Dull [[id number]]     Return precautions given.   Risks, benefits, and alternatives of the medications and treatment plan prescribed today were discussed, and patient expressed understanding.   Education regarding symptom management and diagnosis given to patient  on AVS.  Continue to follow with Allegra Grana, FNP for routine health maintenance.   [name] and I agreed with plan.   [name], FNP

## 2019-08-20 NOTE — Patient Instructions (Addendum)
Reschedule HIDA scan  Start zoloft  Let me know how you doing

## 2019-08-21 NOTE — Assessment & Plan Note (Signed)
Chronic stable. Continue regimen. Reiterated the importance of consult with pain management next month

## 2019-08-21 NOTE — Assessment & Plan Note (Signed)
Resolved at this time. Has follow up with GI ( will follow) and pending HIDA scan. Advised to pursue EGD, colonoscopy this year. He stated that he will discuss this with GI

## 2019-08-21 NOTE — Assessment & Plan Note (Signed)
Trial of zoloft. Close follow up.  

## 2019-09-10 ENCOUNTER — Ambulatory Visit: Payer: Managed Care, Other (non HMO) | Admitting: Student in an Organized Health Care Education/Training Program

## 2019-09-23 ENCOUNTER — Other Ambulatory Visit: Payer: Self-pay | Admitting: Family

## 2019-09-23 DIAGNOSIS — M545 Low back pain, unspecified: Secondary | ICD-10-CM

## 2019-09-23 DIAGNOSIS — G8929 Other chronic pain: Secondary | ICD-10-CM

## 2019-09-30 ENCOUNTER — Telehealth: Payer: Self-pay | Admitting: Family

## 2019-09-30 ENCOUNTER — Telehealth (INDEPENDENT_AMBULATORY_CARE_PROVIDER_SITE_OTHER): Payer: Managed Care, Other (non HMO) | Admitting: Family

## 2019-09-30 ENCOUNTER — Encounter: Payer: Self-pay | Admitting: Family

## 2019-09-30 DIAGNOSIS — F419 Anxiety disorder, unspecified: Secondary | ICD-10-CM

## 2019-09-30 DIAGNOSIS — R1013 Epigastric pain: Secondary | ICD-10-CM | POA: Diagnosis not present

## 2019-09-30 DIAGNOSIS — M545 Low back pain, unspecified: Secondary | ICD-10-CM

## 2019-09-30 DIAGNOSIS — R079 Chest pain, unspecified: Secondary | ICD-10-CM

## 2019-09-30 DIAGNOSIS — N529 Male erectile dysfunction, unspecified: Secondary | ICD-10-CM | POA: Diagnosis not present

## 2019-09-30 DIAGNOSIS — G8929 Other chronic pain: Secondary | ICD-10-CM

## 2019-09-30 DIAGNOSIS — K219 Gastro-esophageal reflux disease without esophagitis: Secondary | ICD-10-CM

## 2019-09-30 NOTE — Assessment & Plan Note (Signed)
Unchanged, chronic. Question part of GERD or PUD presentation, MS etiology or related to anxiety , question anxiety attack. Advised to restart zoloft 50mg  and prior to discontinuing if he finds not effective, we will actually increase in 2-3 weeks. Close follow up.

## 2019-09-30 NOTE — Assessment & Plan Note (Signed)
Pending testosterone. Will work on treating anxiety as appears to be exacerbating.
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
## 2019-09-30 NOTE — Assessment & Plan Note (Signed)
Uncontrolled. Pending EGD. Advised to continue regimen and maintain close follow up with GI. Will follow.

## 2019-09-30 NOTE — Assessment & Plan Note (Signed)
Chronic, somewhat worse. Pending XR. Wil continue current regimen. He declines pain management consult.

## 2019-09-30 NOTE — Telephone Encounter (Signed)
Pt needs 1-2 week lab/xray and 3 month follow up with Claris Che. I cancelled Oct visit since she wants a 3 month follow up.

## 2019-09-30 NOTE — Assessment & Plan Note (Signed)
Trial zoloft again for uncontrolled anxiety.Close follow up.

## 2019-09-30 NOTE — Patient Instructions (Addendum)
Lets restart zoloft 50mg  and do this for 3 weeks at least. Then you may increase to 100mg  zoloft per day.

## 2019-10-08 ENCOUNTER — Other Ambulatory Visit: Payer: Managed Care, Other (non HMO)

## 2019-10-10 ENCOUNTER — Other Ambulatory Visit: Payer: Managed Care, Other (non HMO)

## 2019-10-13 ENCOUNTER — Other Ambulatory Visit: Payer: Self-pay | Admitting: Family

## 2019-10-13 ENCOUNTER — Encounter: Payer: Self-pay | Admitting: Family

## 2019-10-13 DIAGNOSIS — M545 Low back pain, unspecified: Secondary | ICD-10-CM

## 2019-10-13 DIAGNOSIS — G8929 Other chronic pain: Secondary | ICD-10-CM

## 2019-10-21 ENCOUNTER — Ambulatory Visit: Payer: Managed Care, Other (non HMO) | Admitting: Family

## 2019-11-16 ENCOUNTER — Other Ambulatory Visit: Payer: Self-pay | Admitting: Family

## 2019-11-16 DIAGNOSIS — G8929 Other chronic pain: Secondary | ICD-10-CM

## 2019-11-16 DIAGNOSIS — M545 Low back pain, unspecified: Secondary | ICD-10-CM

## 2019-12-09 ENCOUNTER — Other Ambulatory Visit: Payer: Self-pay | Admitting: Family

## 2019-12-09 DIAGNOSIS — M545 Low back pain, unspecified: Secondary | ICD-10-CM

## 2019-12-09 DIAGNOSIS — G8929 Other chronic pain: Secondary | ICD-10-CM

## 2019-12-12 ENCOUNTER — Telehealth: Payer: Self-pay

## 2019-12-12 NOTE — Telephone Encounter (Signed)
[facility] [place] Night - Cl TELEPHONE ADVICE RECORD [facility] Patient Name: Philip Stephens Gender: Male DOB: Jan 21, 1975 Age: 44 Y 3 M 11 D Return Phone Number: [phone] (Primary) Address: City/State/Zip: [name] [address] Client [facility] Night - Cl Client Site  [facility] - Night Physician [name]- NP Contact Type Call Who Is Calling Patient / Member / Family / Caregiver Call Type Triage / Clinical Relationship To Patient Self Return Phone Number [phone] (Primary) Chief Complaint Infection Exposure (non-symptomatic) Reason for Call Symptomatic / Request for Health Information Initial Comment Caller needs to be tested for covid and has questions. Son tested pos for covid. Translation No Nurse Assessment Nurse: Raymon Mutton, RN, Misty Stanley Date/Time (Eastern Time): 12/11/2019 6:43:39 PM Confirm and document reason for call. If symptomatic, describe symptoms. ---Caller states he needs to be tested for COVID. His son tested positive today. He states he has to have a negative test prior to going back to work. He states he started coughing yesterday with a scratchy throat. Denies fever. Does the patient have any new or worsening symptoms? ---Yes Will a triage be completed? ---Yes Related visit to physician within the last 2 weeks? ---No Does the PT have any chronic conditions? (i.e. diabetes, asthma, this includes High risk factors for pregnancy, etc.) ---Yes List chronic conditions. ---Tramadol and Lyrica for back injury, Takes Metoprolol for fast heart beat. Is this a behavioral health or substance abuse call? ---No Guidelines Guideline Title Affirmed Question Affirmed Notes Nurse Date/Time (Eastern Time) COVID-19 - Diagnosed or Suspected [1] COVID-19 infection suspected by caller or triager AND [2] mild symptoms (cough, fever, or others) AND [3] has not gotten tested yet Raymon Mutton,  Scientist, research (life sciences) 12/11/2019 6:46:13 PM Disp. Time Lamount Cohen Time) Disposition Final User 12/11/2019 6:30:53 PM Attempt made - message left Guinn, RN, Misty Stanley PLEASE NOTE: All timestamps contained within this report are represented as [place] Standard Time. CONFIDENTIALTY NOTICE: This fax transmission is intended only for the addressee. It contains information that is legally privileged, confidential or otherwise protected from use or disclosure. If you are not the intended recipient, you are strictly prohibited from reviewing, disclosing, copying using or disseminating any of this information or taking any action in reliance on or regarding this information. If you have received this fax in error, please notify us immediately by telephone so that we can arrange for its return to [place]. Phone: [phone], Toll-Free: [phone], Fax: [phone] Page: 2 of 2 Call Id: [id number] 12/11/2019 6:49:54 PM Call PCP when Office is Open Yes Raymon Mutton, RN, Gracy Racer Disagree/Comply Comply Caller Understands Yes PreDisposition Did not know what to do Care Advice Given Per Guideline CALL PCP WHEN OFFICE IS OPEN: * You need to discuss this with your doctor (or NP/PA) within the next few days. * Many clinics, retail clinics (such as [facility], [facility]), and urgent care centers perform testing. NOTE TO TRIAGER - COVID-19 TESTING: REASSURANCE AND EDUCATION - SUSPECTED COVID-19 AND TESTING NEEDED: * Most people who get COVID-19 will have mild illness and can recover at home without medical care. * You should get tested for COVID-19. * Here's some care advice to help you and to help prevent others from getting sick. COVID-19 - WHO NEEDS TESTING? COVID-19 - WHERE TO GO FOR TESTING? * The treatment is the same whether you have COVID-19, influenza or some other respiratory virus. * Cough: Use cough drops. GENERAL CARE ADVICE FOR COVID-19 SYMPTOMS: * Fever: For fever over 101 F (38.3 C), take  acetaminophen every 4 to 6 hours (Adults 650  mg) OR ibuprofen every 6 to 8 hours (Adults 400 mg). Before taking any medicine, read all the instructions on the package. Do not take aspirin unless your doctor has prescribed it for you. COUGHING SPELLS: * Drink warm fluids. Inhale warm mist. This can help relax the airway and also loosen up phlegm. AVOID TOBACCO SMOKE: * Avoid tobacco smoke. CALL BACK IF: * Fever over 103 F (39.4 C) * Fever lasts over 3 days * You become worse * Chest pain or difficulty breathing occurs CARE ADVICE given per COVID-19 - DIAGNOSED OR SUSPECTED (Adult) guideline. * Fever returns after being gone for 24 hours Comments User: [name], RN Date/Time (Eastern Time): 12/11/2019 6:50:30 PM Caller states [facility]. He states he did have COVID Dec 2020 Referrals REFERRED TO PCP OFFICE

## 2019-12-16 NOTE — Telephone Encounter (Signed)
Just FYI I spoke with patient through [place] on Friday. Pt stated that he was covid positive after being tested & that luckily symptoms were mild for him as well as family. I offered to refer patient for MAI & get him on schedule this week for VV. Pt declined & felt that he was okay. He said that he would just keep his appointment on 12/15 & change to VV. He said that he would call & let us know if anything changed.

## 2019-12-16 NOTE — Telephone Encounter (Signed)
Call pt See how he is doing.  Please arrange for covid test here and schedule a VV  When did symptoms start? If within 10 days, please advise that we recommend MAI to prevent severe disease in setting of close contact with covid. You may call MAI clinic if he is okay with that

## 2019-12-16 NOTE — Telephone Encounter (Signed)
noted 

## 2019-12-18 ENCOUNTER — Telehealth: Payer: Managed Care, Other (non HMO) | Admitting: Family

## 2019-12-19 ENCOUNTER — Encounter: Payer: Self-pay | Admitting: Family

## 2019-12-20 ENCOUNTER — Telehealth (INDEPENDENT_AMBULATORY_CARE_PROVIDER_SITE_OTHER): Payer: Managed Care, Other (non HMO) | Admitting: Family

## 2019-12-20 ENCOUNTER — Telehealth: Payer: Self-pay | Admitting: Family

## 2019-12-20 VITALS — Ht 74.0 in | Wt 170.0 lb

## 2019-12-20 DIAGNOSIS — R079 Chest pain, unspecified: Secondary | ICD-10-CM | POA: Diagnosis not present

## 2019-12-20 DIAGNOSIS — M545 Low back pain, unspecified: Secondary | ICD-10-CM | POA: Diagnosis not present

## 2019-12-20 DIAGNOSIS — G8929 Other chronic pain: Secondary | ICD-10-CM

## 2019-12-20 DIAGNOSIS — U071 COVID-19: Secondary | ICD-10-CM

## 2019-12-20 DIAGNOSIS — Z1211 Encounter for screening for malignant neoplasm of colon: Secondary | ICD-10-CM

## 2019-12-20 MED ORDER — TRAMADOL HCL 50 MG PO TABS: ORAL_TABLET | ORAL | 1 refills | Status: DC

## 2019-12-20 MED ORDER — DOXYCYCLINE HYCLATE 100 MG PO TABS: 100.0000 mg | ORAL_TABLET | Freq: Two times a day (BID) | ORAL | 0 refills | Status: DC

## 2019-12-20 NOTE — Telephone Encounter (Signed)
Please order cologuard for pt Philip Stephens!

## 2019-12-20 NOTE — Telephone Encounter (Signed)
I have ordered for patient.

## 2019-12-20 NOTE — Progress Notes (Signed)
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  Productive thick discolored cough continues, unchanged.  covid positive 8 days ago.  Son, wife covid positive.  Continues to have no taste or smell. Eating and drinking.   No sob, wheezing, sinus pain.   Taking nyquil with relief.   Low back pain- continues to take lyrica 150mg  , 3 tablets per day. Takes tramadol 100mg  BID and reports had been on 100mg  BID and would take an extra 50mg  as prn. There are day when his back pain is worse and he would like one tablet to take as  Needed.   Back pain improves with rest and back pain has returned to baseline as he has been on off work for several days.   No chest pain in over a month.   Takes nexium 20mg  BID, weaned from nexium 40mg  BID. He would like to come off completely.    Had planned to do EGD/colonoscopy and when arrived saw patient leaving in ambulance. He decided not to move forward with procedure.  Would like to have cologuard. No family h/o colon cancer.     A/P/next steps:  Problem List Items Addressed This Visit      Other   Chronic midline low back pain without sciatica    Chronic at baseline. I looked up patient on [place] Controlled Substances Reporting System PMP AWARE and saw no activity that raised concern of inappropriate use.  I think it is appropriate to take tramadol 100mg  BID with additional tablet of tramadol 50mg . He will continue lyrica 450mg  daily.       Relevant Medications   traMADol (ULTRAM) 50 MG tablet   COVID-19 - Primary    No acute respiratory distress. Patient declines MAI infusion. Based on duration of symptoms, I am concerned patient may have developed secondary bacterial infection. I have discussed that he may start doxycycline  over the next couple of days if symptoms do not start to improve. Advised any sob to present to ED.       Relevant Medications   doxycycline (VIBRA-TABS) 100 MG tablet   Right-sided chest pain    No recurrence. Will follow.      Screen for colon cancer    Patient prefers Cologuard over colonoscopy. He understands if cologuard positive he would need a colonoscopy. Cologuard to be ordered by , cma.          I spent 21 min  discussing plan of care over the phone.

## 2019-12-20 NOTE — Assessment & Plan Note (Signed)
No recurrence. Will follow.

## 2019-12-20 NOTE — Assessment & Plan Note (Signed)
Patient prefers Cologuard over colonoscopy. He understands if cologuard positive he would need a colonoscopy. Cologuard to be ordered by Maralyn Sago, cma.

## 2019-12-20 NOTE — Assessment & Plan Note (Signed)
No acute respiratory distress. Patient declines MAI infusion. Based on duration of symptoms, I am concerned patient may have developed secondary bacterial infection. I have discussed that he may start doxycycline over the next couple of days if symptoms do not start to improve. Advised any sob to present to ED.

## 2019-12-20 NOTE — Assessment & Plan Note (Signed)
Chronic at baseline. I looked up patient on [place] Controlled Substances Reporting System PMP AWARE and saw no activity that raised concern of inappropriate use.  I think it is appropriate to take tramadol 100mg  BID with additional tablet of tramadol 50mg . He will continue lyrica 450mg  daily.

## 2019-12-25 ENCOUNTER — Telehealth: Payer: Managed Care, Other (non HMO) | Admitting: Family

## 2020-01-01 ENCOUNTER — Ambulatory Visit: Payer: Managed Care, Other (non HMO) | Admitting: Family

## 2020-01-11 ENCOUNTER — Other Ambulatory Visit: Payer: Self-pay | Admitting: Family

## 2020-01-11 DIAGNOSIS — G8929 Other chronic pain: Secondary | ICD-10-CM

## 2020-01-11 DIAGNOSIS — M545 Low back pain, unspecified: Secondary | ICD-10-CM

## 2020-01-18 ENCOUNTER — Other Ambulatory Visit: Payer: Self-pay | Admitting: Family

## 2020-01-18 DIAGNOSIS — G8929 Other chronic pain: Secondary | ICD-10-CM

## 2020-01-18 DIAGNOSIS — M545 Low back pain, unspecified: Secondary | ICD-10-CM

## 2020-01-31 NOTE — Telephone Encounter (Signed)
Consent

## 2020-02-12 ENCOUNTER — Other Ambulatory Visit: Payer: Self-pay | Admitting: Family

## 2020-02-12 DIAGNOSIS — M542 Cervicalgia: Secondary | ICD-10-CM

## 2020-02-12 DIAGNOSIS — G8929 Other chronic pain: Secondary | ICD-10-CM

## 2020-02-19 ENCOUNTER — Encounter: Payer: Self-pay | Admitting: Family

## 2020-02-19 ENCOUNTER — Other Ambulatory Visit: Payer: Self-pay

## 2020-02-19 ENCOUNTER — Telehealth (INDEPENDENT_AMBULATORY_CARE_PROVIDER_SITE_OTHER): Payer: 59 | Admitting: Family

## 2020-02-19 VITALS — Ht 74.0 in | Wt 170.0 lb

## 2020-02-19 DIAGNOSIS — F419 Anxiety disorder, unspecified: Secondary | ICD-10-CM | POA: Diagnosis not present

## 2020-02-19 DIAGNOSIS — M542 Cervicalgia: Secondary | ICD-10-CM

## 2020-02-19 DIAGNOSIS — M545 Low back pain, unspecified: Secondary | ICD-10-CM

## 2020-02-19 DIAGNOSIS — G8929 Other chronic pain: Secondary | ICD-10-CM

## 2020-02-19 DIAGNOSIS — Z1211 Encounter for screening for malignant neoplasm of colon: Secondary | ICD-10-CM

## 2020-02-19 MED ORDER — DULOXETINE HCL 30 MG PO CPEP: 30.0000 mg | ORAL_CAPSULE | Freq: Every day | ORAL | 1 refills | Status: DC

## 2020-02-19 NOTE — Progress Notes (Signed)
Virtual Visit via Video Note  I connected with@  on 02/21/20 at  4:00 PM EST by a video enabled telemedicine application and verified that I am speaking with the correct person using two identifiers.  Location patient: home Location provider:work  Persons participating in the virtual visit: patient, provider  I discussed the limitations of evaluation and management by telemedicine and the availability of in person appointments. The patient expressed understanding and agreed to proceed.   HPI: Complains of low back pain, more left sided, has been bothering him for 2 months, improved the past 2 days. Typically wears back brace however not wearing today as pain as improved. Pain radiates to anterior and posterior thighs, and feels ache. Occasional numbness in distal hands bilaterally. No numbness in legs , trouble urinating /having a bowel movement or saddle anesthesia.   Has started omega XL and vinegar pills past week and he has felt better.   Compliant with lyrica 170m TID, tramadol 1045mevery 12 hours.   Back pain improves with rest. 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 Recently when home with covid, he noticed he had no back pain. He would like an xray of his entire back.  He has increase stress over health concerns regarding daughter and wife. He has more anxiety of late.  He tried cymbalta 2 years ago and felt drowsy; he would be interested in trying again.     Has cologuard kit and will send box in soon.     ROS: See pertinent positives and negatives per HPI.    EXAM:  VITALS per patient if applicable: Ht <R<[id number]>_1<\/[id number]>_4(1.88 m)   Wt 170 lb (77.1 kg)   BMI 21.83 kg/m  BP Readings from Last 3 Encounters:  08/20/19 124/82  07/25/19 108/82  06/21/19 122/80   Wt Readings from Last 3 Encounters:  02/19/20 170 lb (77.1 kg)  12/19/19 170 lb (77.1 kg)  08/20/19 162 lb (73.5 kg)    GENERAL: alert, oriented, appears well and in no acute distress  HEENT:  atraumatic, conjunttiva clear, no obvious abnormalities on inspection of external nose and ears  NECK: normal movements of the head and neck  LUNGS: on inspection no signs of respiratory distress, breathing rate appears normal, no obvious gross SOB, gasping or wheezing  CV: no obvious cyanosis  MS: moves all visible extremities without noticeable abnormality  PSYCH/NEURO: pleasant and cooperative, no obvious depression or anxiety, speech and thought processing grossly intact  ASSESSMENT AND PLAN:  Discussed the following assessment and plan:  Problem List Items Addressed This Visit      Other   Anxiety    Uncontrolled. Trial of cymbalta. Close follow up.      Relevant Medications   DULoxetine (CYMBALTA) 30 MG capsule   Chronic midline low back pain without sciatica    Acute on chronic. Suboptimal control however Improved today. Declines prednisone taper which I think is appropriate. Advised that I cannot advise regarding Omega XL in regards to effectiveness or safety. Continue  lyrica 15036mID, tramadol 100m35mery 12 hours however with re trial of cymbalta, I would like to see if we can reduce tramadol dose. Close follow up.       Chronic neck pain - Primary   Relevant Medications   DULoxetine (CYMBALTA) 30 MG capsule   Other Relevant Orders   DG Lumbar Spine Complete   CBC with Differential/Platelet   TSH   Comprehensive metabolic panel   Hemoglobin A1c   Lipid  panel   VITAMIN D 25 Hydroxy (Vit-D Deficiency, Fractures)   DG Cervical Spine Complete   DG Thoracic Spine W/Swimmers   Screening for colon cancer    Advised against cologuard and encouraged him to reach out to [facility] whom he is established with to arrange for colonoscopy and EDG was was discussed many times in the past. He will consider this.          -we discussed possible serious and likely etiologies, options for evaluation and workup, limitations of telemedicine visit vs in person visit, treatment,  treatment risks and precautions. Pt prefers to treat via telemedicine empirically rather then risking or undertaking an in person visit at this moment.  .   I discussed the assessment and treatment plan with the patient. The patient was provided an opportunity to ask questions and all were answered. The patient agreed with the plan and demonstrated an understanding of the instructions.   The patient was advised to call back or seek an in-person evaluation if the symptoms worsen or if the condition fails to improve as anticipated.   Mable Paris, FNP

## 2020-02-20 ENCOUNTER — Telehealth: Payer: Self-pay | Admitting: Family

## 2020-02-20 NOTE — Telephone Encounter (Signed)
Lm on vm to call office to schedule in 1 to 2 weeks a fasting lab/xray, and schedule a 62m follow up around 05/18/20. Also lm that patient's 36m follow on 03/20/2020 was cancelled.

## 2020-02-21 NOTE — Assessment & Plan Note (Signed)
Advised against cologuard and encouraged him to reach out to [facility] whom he is established with to arrange for colonoscopy and EDG was was discussed many times in the past. He will consider this.

## 2020-02-21 NOTE — Assessment & Plan Note (Signed)
Uncontrolled. Trial of cymbalta. Close follow up.

## 2020-02-21 NOTE — Assessment & Plan Note (Addendum)
Acute on chronic. Suboptimal control however Improved today. Declines prednisone taper which I think is appropriate. Advised that I cannot advise regarding Omega XL in regards to effectiveness or safety. Continue  lyrica 150mg  TID, tramadol 100mg  every 12 hours however with re trial of cymbalta, I would like to see if we can reduce tramadol dose. Close follow up.

## 2020-02-22 ENCOUNTER — Other Ambulatory Visit: Payer: Self-pay | Admitting: Family

## 2020-02-22 DIAGNOSIS — G8929 Other chronic pain: Secondary | ICD-10-CM

## 2020-02-22 DIAGNOSIS — M545 Low back pain, unspecified: Secondary | ICD-10-CM

## 2020-02-24 MED ORDER — PREGABALIN 150 MG PO CAPS: ORAL_CAPSULE | ORAL | 1 refills | Status: DC

## 2020-03-10 ENCOUNTER — Encounter: Payer: Self-pay | Admitting: Family

## 2020-03-10 NOTE — Telephone Encounter (Signed)
Patient scheduled for OV tomorrow at 11:30.

## 2020-03-11 ENCOUNTER — Encounter: Payer: Self-pay | Admitting: Family

## 2020-03-11 ENCOUNTER — Other Ambulatory Visit: Payer: Self-pay

## 2020-03-11 ENCOUNTER — Ambulatory Visit: Payer: 59 | Admitting: Family

## 2020-03-11 VITALS — BP 120/70 | HR 85 | Temp 98.2°F | Ht 74.0 in | Wt 161.6 lb

## 2020-03-11 DIAGNOSIS — G8929 Other chronic pain: Secondary | ICD-10-CM | POA: Diagnosis not present

## 2020-03-11 DIAGNOSIS — M545 Low back pain, unspecified: Secondary | ICD-10-CM | POA: Diagnosis not present

## 2020-03-11 DIAGNOSIS — F419 Anxiety disorder, unspecified: Secondary | ICD-10-CM | POA: Diagnosis not present

## 2020-03-11 MED ORDER — METHOCARBAMOL 500 MG PO TABS: 500.0000 mg | ORAL_TABLET | Freq: Three times a day (TID) | ORAL | 1 refills | Status: DC | PRN

## 2020-03-11 NOTE — Progress Notes (Signed)
Subjective:    Patient ID: Philip Stephens, male    DOB: Sep 14, 1975, 45 y.o.   MRN: [id number]  CC: Philip Stephens is a 45 y.o. male who presents today for follow up.   HPI: Here today as he lost his best friend tragically 5 days ago. He is in a state of shock and grief. He hasnt tried the cymbalta as he was concerned about weaning off and if leads to withdrawal.  He feels increased anxiety and stress at home with daughter. He has resumed smoking again.   Chronic ow back pain. Unchanged from prior.  Improves with rest and when he is not at work, back pain is improved. .  Compliant with lyrica 150mg  TID, tramadol 100mg  every 12 hours with control of pain.   Rare use of flexeril as causes sedation.  Would like to trial robaxin.       HISTORY:  Past Medical History:  Diagnosis Date  . Arthritis   . Hypertension    History reviewed. No pertinent surgical history. Family History  Problem Relation Age of Onset  . Hyperlipidemia Mother   . Anxiety disorder Mother   . Hyperlipidemia Father   . Hearing loss Father   . Diabetes Father   . Arthritis Father     Allergies: Penicillins Current Outpatient Medications on File Prior to Visit  Medication Sig Dispense Refill  . Cholecalciferol 25 MCG (1000 UT) capsule Take by mouth.    . esomeprazole (NEXIUM) 20 MG capsule Take 1 capsule (20 mg total) by mouth 2 (two) times daily before a meal. 120 capsule 1  . metoprolol succinate (TOPROL-XL) 25 MG 24 hr tablet Take 1 tablet by mouth daily.    . Multiple Vitamin (MULTI-VITAMIN) tablet Take 1 tablet by mouth daily.    . pregabalin (LYRICA) 150 MG capsule TAKE ONE CAPSULE BY MOUTH THREE TIMES A DAY 90 capsule 1  . traMADol (ULTRAM) 50 MG tablet TAKE 2 TABLETS BY MOUTH EVERY 12 HOURS AS NEEDED FOR PAIN. MAY TAKE 1 EXTRA TABLET FOR BREAKTHROUGH PAIN 150 tablet 1  . DULoxetine (CYMBALTA) 30 MG capsule Take 1 capsule (30 mg total) by mouth daily. (Patient not taking: Reported on 03/11/2020)  90 capsule 1   No current facility-administered medications on file prior to visit.    Social History   Tobacco Use  . Smoking status: Former Smoker    Packs/day: 2.00    Years: 28.00    Pack years: 56.00    Types: Cigarettes    Quit date: 02/19/2018    Years since quitting: 2.0  . Smokeless tobacco: Former 05/11/2020  . Tobacco comment: Does Velo. ( has nicotine , no tobacco).   Substance Use Topics  . Alcohol use: Not Currently  . Drug use: Never    Review of Systems  Constitutional: Negative for chills and fever.  Respiratory: Negative for cough.   Cardiovascular: Negative for chest pain and palpitations.  Gastrointestinal: Negative for nausea and vomiting.  Musculoskeletal: Positive for back pain.  Neurological: Negative for numbness.  Psychiatric/Behavioral: Negative for sleep disturbance. The patient is nervous/anxious.       Objective:    BP 120/70   Pulse 85   Temp 98.2 F (36.8 C)   Ht 6\' 2"  (1.88 m)   Wt 161 lb 9.6 oz (73.3 kg)   SpO2 98%   BMI 20.75 kg/m  BP Readings from Last 3 Encounters:  03/11/20 120/70  08/20/19 124/82  07/25/19 108/82   Wt Readings  from Last 3 Encounters:  03/11/20 161 lb 9.6 oz (73.3 kg)  02/19/20 170 lb (77.1 kg)  12/19/19 170 lb (77.1 kg)   Depression screen [facility] 2/9 07/25/2019 02/01/2019 01/01/2018  Decreased Interest 0 0 3  Down, Depressed, Hopeless 0 0 0  PHQ - 2 Score 0 0 3  Altered sleeping - - 0  Tired, decreased energy - - 2  Change in appetite - - 1  Feeling bad or failure about yourself  - - 0  Trouble concentrating - - 0  Moving slowly or fidgety/restless - - 0  Suicidal thoughts - - 0  PHQ-9 Score - - 6     Physical Exam Vitals reviewed.  Constitutional:      Appearance: He is well-developed.  Cardiovascular:     Rate and Rhythm: Regular rhythm.     Heart sounds: Normal heart sounds.  Pulmonary:     Effort: Pulmonary effort is normal. No respiratory distress.     Breath sounds: Normal breath sounds. No  wheezing, rhonchi or rales.  Skin:    General: Skin is warm and dry.  Neurological:     Mental Status: He is alert.  Psychiatric:        Speech: Speech normal.        Behavior: Behavior normal.        Assessment & Plan:   Problem List Items Addressed This Visit      Other   Anxiety    Uncontrolled and complicated by recent and tragic lost of best friend. He is very agreeable to starting cymbalta 30mg . Counseled on how to wean from high doses such as 60mg  qd back to 30mg  QD to avoid withdrawal from medication. All questions answered. Close follow up.      Chronic midline low back pain without sciatica - Primary    Chronic. Stable. He declines pain management referral. Continue lyrica 150mg  TID, tramadol 100mg  every 12 hours . Stop flexeril. Trial of robaxin 500mg  to be used at bedtime. Advised not to use with alcohol, driving, or operating machinery for safety.       Relevant Medications   methocarbamol (ROBAXIN) 500 MG tablet   Other Relevant Orders   CBC with Differential/Platelet (Completed)   Comprehensive metabolic panel (Completed)   Lipid panel (Completed)       I have discontinued Philip Stephens's famotidine, sucralfate, doxycycline, and cyclobenzaprine. I am also having him start on methocarbamol. Additionally, I am having him maintain his metoprolol succinate, Multi-Vitamin, Cholecalciferol, esomeprazole, DULoxetine, traMADol, and pregabalin.   Meds ordered this encounter  Medications  . methocarbamol (ROBAXIN) 500 MG tablet    Sig: Take 1 tablet (500 mg total) by mouth every 8 (eight) hours as needed for muscle spasms.    Dispense:  30 tablet    Refill:  1    Order Specific Question:   Supervising Provider    Answer:   [[id number]]    Return precautions given.   Risks, benefits, and alternatives of the medications and treatment plan prescribed today were discussed, and patient expressed understanding.   Education regarding symptom management  and diagnosis given to patient on AVS.  Continue to follow with , FNP for routine health maintenance.   and I agreed with plan.   , FNP

## 2020-03-11 NOTE — Patient Instructions (Addendum)
Trial of cymbalta  Trial of robaxin.   Do not drive or operate heavy machinery while on muscle relaxant. Please do not drink alcohol. Only take this medication as needed for acute muscle spasm at bedtime. This medication make you feel drowsy so be very careful.  Stop taking if become too drowsy or somnolent as this puts you at risk for falls. Please contact our office with any questions.   Nice to see you!

## 2020-03-13 ENCOUNTER — Ambulatory Visit (INDEPENDENT_AMBULATORY_CARE_PROVIDER_SITE_OTHER): Payer: 59

## 2020-03-13 ENCOUNTER — Ambulatory Visit: Payer: Self-pay

## 2020-03-13 ENCOUNTER — Other Ambulatory Visit: Payer: Self-pay

## 2020-03-13 ENCOUNTER — Other Ambulatory Visit (INDEPENDENT_AMBULATORY_CARE_PROVIDER_SITE_OTHER): Payer: 59

## 2020-03-13 DIAGNOSIS — M542 Cervicalgia: Secondary | ICD-10-CM

## 2020-03-13 DIAGNOSIS — G8929 Other chronic pain: Secondary | ICD-10-CM

## 2020-03-13 DIAGNOSIS — M545 Low back pain, unspecified: Secondary | ICD-10-CM

## 2020-03-13 LAB — CBC WITH DIFFERENTIAL/PLATELET
Basophils Absolute: 0 10*3/uL (ref 0.0–0.1)
Basophils Relative: 0.2 % (ref 0.0–3.0)
Eosinophils Absolute: 0.3 10*3/uL (ref 0.0–0.7)
Eosinophils Relative: 2.2 % (ref 0.0–5.0)
HCT: 40.4 % (ref 39.0–52.0)
Hemoglobin: 13.4 g/dL (ref 13.0–17.0)
Lymphocytes Relative: 24.3 % (ref 12.0–46.0)
Lymphs Abs: 2.8 10*3/uL (ref 0.7–4.0)
MCHC: 33.3 g/dL (ref 30.0–36.0)
MCV: 88.6 fl (ref 78.0–100.0)
Monocytes Absolute: 0.7 10*3/uL (ref 0.1–1.0)
Monocytes Relative: 5.7 % (ref 3.0–12.0)
Neutro Abs: 7.8 10*3/uL — ABNORMAL HIGH (ref 1.4–7.7)
Neutrophils Relative %: 67.6 % (ref 43.0–77.0)
Platelets: 279 10*3/uL (ref 150.0–400.0)
RBC: 4.56 Mil/uL (ref 4.22–5.81)
RDW: 14.4 % (ref 11.5–15.5)
WBC: 11.5 10*3/uL — ABNORMAL HIGH (ref 4.0–10.5)

## 2020-03-13 LAB — LIPID PANEL
Cholesterol: 218 mg/dL — ABNORMAL HIGH (ref 0–200)
HDL: 34.2 mg/dL — ABNORMAL LOW (ref >39.00)
NonHDL: 184.15
Total CHOL/HDL Ratio: 6
Triglycerides: 323 mg/dL — ABNORMAL HIGH (ref 0.0–149.0)
VLDL: 64.6 mg/dL — ABNORMAL HIGH (ref 0.0–40.0)

## 2020-03-13 LAB — COMPREHENSIVE METABOLIC PANEL
ALT: 10 U/L (ref 0–53)
AST: 14 U/L (ref 0–37)
Albumin: 4.1 g/dL (ref 3.5–5.2)
Alkaline Phosphatase: 96 U/L (ref 39–117)
BUN: 13 mg/dL (ref 6–23)
CO2: 29 mEq/L (ref 19–32)
Calcium: 8.8 mg/dL (ref 8.4–10.5)
Chloride: 100 mEq/L (ref 96–112)
Creatinine, Ser: 1.34 mg/dL (ref 0.40–1.50)
GFR: 63.96 mL/min (ref >60.00)
Glucose, Bld: 91 mg/dL (ref 70–99)
Potassium: 4.1 mEq/L (ref 3.5–5.1)
Sodium: 137 mEq/L (ref 135–145)
Total Bilirubin: 0.2 mg/dL (ref 0.2–1.2)
Total Protein: 6.6 g/dL (ref 6.0–8.3)

## 2020-03-13 LAB — LDL CHOLESTEROL, DIRECT: Direct LDL: 141 mg/dL

## 2020-03-13 NOTE — Addendum Note (Signed)
Addended by: Warden Fillers on: 03/13/2020 01:05 PM   Modules accepted: Orders

## 2020-03-13 NOTE — Addendum Note (Signed)
Addended by: Warden Fillers on: 03/13/2020 02:22 PM   Modules accepted: Orders

## 2020-03-13 NOTE — Addendum Note (Signed)
Addended by: Warden Fillers on: 03/13/2020 02:15 PM   Modules accepted: Orders

## 2020-03-15 ENCOUNTER — Encounter: Payer: Self-pay | Admitting: Family

## 2020-03-16 ENCOUNTER — Telehealth: Payer: Self-pay | Admitting: Family

## 2020-03-16 ENCOUNTER — Other Ambulatory Visit: Payer: Self-pay | Admitting: Family

## 2020-03-16 ENCOUNTER — Encounter: Payer: Self-pay | Admitting: Family

## 2020-03-16 ENCOUNTER — Encounter: Payer: Self-pay | Admitting: *Deleted

## 2020-03-16 ENCOUNTER — Telehealth (INDEPENDENT_AMBULATORY_CARE_PROVIDER_SITE_OTHER): Payer: 59 | Admitting: Family

## 2020-03-16 VITALS — Ht 74.0 in | Wt 161.0 lb

## 2020-03-16 DIAGNOSIS — F419 Anxiety disorder, unspecified: Secondary | ICD-10-CM

## 2020-03-16 DIAGNOSIS — I2584 Coronary atherosclerosis due to calcified coronary lesion: Secondary | ICD-10-CM

## 2020-03-16 DIAGNOSIS — I2510 Atherosclerotic heart disease of native coronary artery without angina pectoris: Secondary | ICD-10-CM

## 2020-03-16 DIAGNOSIS — M545 Low back pain, unspecified: Secondary | ICD-10-CM | POA: Diagnosis not present

## 2020-03-16 DIAGNOSIS — G8929 Other chronic pain: Secondary | ICD-10-CM | POA: Diagnosis not present

## 2020-03-16 NOTE — Telephone Encounter (Signed)
Would you reach out radiology and see where results are for XR thoracic spine?  [phone]

## 2020-03-16 NOTE — Telephone Encounter (Signed)
Margaret & patient notified that image did not fully come through. Waiting to see if image comes through or is fixable. Patient has been notified it may be faster to come back in to repeat.

## 2020-03-16 NOTE — Progress Notes (Signed)
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 Continues to feel anxiety and worries about his health. He is at work and told his manager that he needed to leave today to take care of himself.   Day of best friend's funeral 3 days ago and he reports sudden vision became blurry. Symptom associated with left cp breast which was sharp in nature. He states it didn't feel like pressure. No vision loss, sob.   'I was so tensed up from panic attack that all the muscles in my chest started to hurt.'   He would like to take time away from work for 6-8 weeks with short term disability to take care of himself  Started cymbalta 4 days ago and pain has improved 'already.'  He made an appointment with Dr Mariah Milling for tomorrow morning as he is worried about his health. No cp today.   No si/hi.   Thoracic xray was not completed. Plans to quit smoking and start eating healthier.   A/P/next steps:  Problem List Items Addressed This Visit      Other   Anxiety    Uncontrolled. Complicated by grief reaction, chronic pain. We agreed to give cymbalta more time. No CP today. Advised if any recurrence to present to ED ahead of appointment with Dr Mariah Milling tomorrow.       Chronic midline low back pain without sciatica - Primary    Improved with cymbalta 30mg . Continue cymbalta 30mg . Reviewed XR lumbar and cervical spine with patient. Advised consideration to see Dr again and patient declines. He would prefer to start conservative management with PT and cymbalta which I think is very reasonable.       Relevant Orders   Ambulatory referral to Physical Therapy       I spent 15 min  discussing plan of care over the phone.

## 2020-03-16 NOTE — Assessment & Plan Note (Addendum)
Improved with cymbalta 30mg . Continue cymbalta 30mg . Reviewed XR lumbar and cervical spine with patient. Advised consideration to see Dr again and patient declines. He would prefer to start conservative management with PT and cymbalta which I think is very reasonable.

## 2020-03-16 NOTE — Assessment & Plan Note (Signed)
Uncontrolled. Complicated by grief reaction, chronic pain. We agreed to give cymbalta more time. No CP today. Advised if any recurrence to present to ED ahead of appointment with Dr Mariah Milling tomorrow.

## 2020-03-16 NOTE — Telephone Encounter (Signed)
Pt scheduled for VV today to discuss.

## 2020-03-16 NOTE — Assessment & Plan Note (Signed)
Uncontrolled and complicated by recent and tragic lost of best friend. He is very agreeable to starting cymbalta 30mg . Counseled on how to wean from high doses such as 60mg  qd back to 30mg  QD to avoid withdrawal from medication. All questions answered. Close follow up.

## 2020-03-16 NOTE — Assessment & Plan Note (Addendum)
Chronic. Stable. He declines pain management referral. Continue lyrica 150mg  TID, tramadol 100mg  every 12 hours . Stop flexeril. Trial of robaxin 500mg  to be used at bedtime. Advised not to use with alcohol, driving, or operating machinery for safety.

## 2020-03-17 ENCOUNTER — Other Ambulatory Visit: Payer: Self-pay

## 2020-03-17 ENCOUNTER — Encounter: Payer: Self-pay | Admitting: Cardiovascular Disease

## 2020-03-17 ENCOUNTER — Ambulatory Visit: Payer: 59 | Admitting: Cardiovascular Disease

## 2020-03-17 VITALS — BP 122/78 | HR 78 | Ht 74.0 in | Wt 159.0 lb

## 2020-03-17 DIAGNOSIS — U071 COVID-19: Secondary | ICD-10-CM | POA: Diagnosis not present

## 2020-03-17 DIAGNOSIS — I2584 Coronary atherosclerosis due to calcified coronary lesion: Secondary | ICD-10-CM

## 2020-03-17 DIAGNOSIS — R079 Chest pain, unspecified: Secondary | ICD-10-CM

## 2020-03-17 DIAGNOSIS — I2510 Atherosclerotic heart disease of native coronary artery without angina pectoris: Secondary | ICD-10-CM

## 2020-03-17 MED ORDER — METOPROLOL SUCCINATE ER 25 MG PO TB24: 25.0000 mg | ORAL_TABLET | Freq: Every day | ORAL | 3 refills | Status: DC

## 2020-03-17 NOTE — Patient Instructions (Signed)
Research Zetia for cholesterol, not a statin   Medication Instructions:  No changes  If you need a refill on your cardiac medications before your next appointment, please call your pharmacy.    Lab work: No new labs needed   If you have labs (blood work) drawn today and your tests are completely normal, you will receive your results only by: Marland Kitchen MyChart Message (if you have MyChart) OR . A paper copy in the mail If you have any lab test that is abnormal or we need to change your treatment, we will call you to review the results.   Testing/Procedures: No new testing needed   Follow-Up: At [facility], you and your health needs are our priority.  As part of our continuing mission to provide you with exceptional heart care, we have created designated Provider Care Teams.  These Care Teams include your primary Cardiologist (physician) and Advanced Practice Providers (APPs -  Physician Assistants and Nurse Practitioners) who all work together to provide you with the care you need, when you need it.  . You will need a follow up appointment as needed  . Providers on your designated Care Team:   . Nicolasa Ducking, NP . Eula Listen, PA-C . Marisue Ivan, PA-C  Any Other Special Instructions Will Be Listed Below (If Applicable).  COVID-19 Vaccine Information can be found at: [email or web address] For questions related to vaccine distribution or appointments, please email [email or web address] or call [phone].

## 2020-03-17 NOTE — Progress Notes (Signed)
Date:  03/17/2020   ID:  [name], DOB 12/19/1975, MRN [id number]  Patient Location:  [address] [place] [address]   Provider location:   [facility],  office  PCP:  Allegra Grana, FNP  Cardiologist:  Fonnie Mu   Chief Complaint  Patient presents with  . Follow-up    Follow up and c/o some pain under left and right breast. Medications verbally reviewed with patient.      History of Present Illness:    Philip Stephens is a 45 y.o. 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 Yves Dill  Prior CV studies:   The following studies were reviewed today:    Past Medical History:  Diagnosis Date  . Arthritis   . Hypertension    History reviewed. No pertinent surgical history.    Allergies:   Penicillins   Social History   Tobacco Use  . Smoking status: Former Smoker    Packs/day: 2.00     Years: 28.00    Pack years: 56.00    Types: Cigarettes    Quit date: 02/19/2018    Years since quitting: 2.0  . Smokeless tobacco: Former Neurosurgeon  . Tobacco comment: Does Velo. ( has nicotine , no tobacco).   Substance Use Topics  . Alcohol use: Not Currently  . Drug use: Never     Current Outpatient Medications on File Prior to Visit  Medication Sig Dispense Refill  . Cholecalciferol 25 MCG (1000 UT) capsule Take by mouth.    . DULoxetine (CYMBALTA) 30 MG capsule Take 1 capsule (30 mg total) by mouth daily. 90 capsule 1  . esomeprazole (NEXIUM) 20 MG capsule Take 1 capsule (20 mg total) by mouth 2 (two) times daily before a meal. 120 capsule 1  . methocarbamol (ROBAXIN) 500 MG tablet Take 1 tablet (500 mg total) by mouth every 8 (eight) hours as needed for muscle spasms. 30 tablet 1  . Multiple Vitamin (MULTI-VITAMIN) tablet Take 1 tablet by mouth daily.    . pregabalin (LYRICA) 150 MG capsule TAKE ONE CAPSULE BY MOUTH THREE TIMES A DAY 90 capsule 1  . traMADol (ULTRAM) 50 MG tablet TAKE 2 TABLETS BY MOUTH EVERY 12 HOURS AS NEEDED FOR PAIN. MAY TAKE 1 EXTRA TABLET FOR BREAKTHROUGH PAIN 150 tablet 1   No current facility-administered medications on  file prior to visit.     Family Hx: The patient's family history includes Anxiety disorder in his mother; Arthritis in his father; Diabetes in his father; Hearing loss in his father; Hyperlipidemia in his father and mother.  ROS:   Please see the history of present illness.    Review of Systems  Constitutional: Negative.   HENT: Negative.   Respiratory: Negative.   Cardiovascular: Negative.   Gastrointestinal: Negative.   Musculoskeletal: Negative.   Neurological: Negative.   Psychiatric/Behavioral: Negative.   All other systems reviewed and are negative.    Labs/Other Tests and Data Reviewed:    Recent Labs: 03/13/2020: ALT 10; BUN 13; Creatinine, Ser 1.34; Hemoglobin 13.4; Platelets 279.0; Potassium 4.1; Sodium 137   Recent  Lipid Panel Lab Results  Component Value Date/Time   CHOL 218 (H) 03/13/2020 02:09 PM   TRIG 323.0 (H) 03/13/2020 02:09 PM   HDL 34.20 (L) 03/13/2020 02:09 PM   CHOLHDL 6 03/13/2020 02:09 PM   LDLDIRECT 141.0 03/13/2020 02:09 PM    Wt Readings from Last 3 Encounters:  03/17/20 159 lb (72.1 kg)  03/16/20 161 lb (73 kg)  03/11/20 161 lb 9.6 oz (73.3 kg)     Exam:    BP 122/78 (BP Location: Left Arm, Patient Position: Sitting, Cuff Size: Normal)   Pulse 78   Ht 6\' 2"  (1.88 m)   Wt 159 lb (72.1 kg)   SpO2 96%   BMI 20.41 kg/m  Constitutional:  oriented to person, place, and time. No distress.  HENT:  Head: Grossly normal Eyes:  no discharge. No scleral icterus.  Neck: No JVD, no carotid bruits  Cardiovascular: Regular rate and rhythm, no murmurs appreciated Pulmonary/Chest: Clear to auscultation bilaterally, no wheezes or rails Abdominal: Soft.  no distension.  no tenderness.  Musculoskeletal: Normal range of motion Neurological:  normal muscle tone. Coordination normal. No atrophy Skin: Skin warm and dry Psychiatric: normal affect, pleasant  ASSESSMENT & PLAN:    Problem List Items Addressed This Visit      Cardiology Problems   Coronary artery calcification   Relevant Medications   metoprolol succinate (TOPROL-XL) 25 MG 24 hr tablet   Other Relevant Orders   EKG 12-Lead     Other   Right-sided chest pain - Primary   Relevant Orders   EKG 12-Lead   COVID-19   Relevant Orders   EKG 12-Lead     Musculoskeletal rib pain hurts with pushing on ribs No further workup Atypical in nature Reassurance provided  Coronary calcification Calcium score of 3, minimally elevated No further cardiac work-up needed  Hyperlipidemia Does not want a pill  Smoking We have encouraged him to continue to work on weaning his cigarettes and smoking cessation. He will continue to work on this and does not want any assistance with chantix.    Total encounter time more than 25  minutes  Greater than 50% was spent in counseling and coordination of care with the patient  Disposition: Follow-up as needed   Signed, , MD  [facility] [address], [place], [place]

## 2020-03-18 ENCOUNTER — Encounter: Payer: Self-pay | Admitting: Family

## 2020-03-19 ENCOUNTER — Encounter: Payer: Self-pay | Admitting: Family

## 2020-03-20 ENCOUNTER — Ambulatory Visit: Payer: Managed Care, Other (non HMO) | Admitting: Family

## 2020-03-31 ENCOUNTER — Other Ambulatory Visit: Payer: Self-pay | Admitting: Family

## 2020-03-31 DIAGNOSIS — M542 Cervicalgia: Secondary | ICD-10-CM

## 2020-03-31 DIAGNOSIS — G8929 Other chronic pain: Secondary | ICD-10-CM

## 2020-04-01 ENCOUNTER — Other Ambulatory Visit: Payer: Self-pay | Admitting: Family

## 2020-04-01 ENCOUNTER — Other Ambulatory Visit: Payer: 59

## 2020-04-01 DIAGNOSIS — M545 Low back pain, unspecified: Secondary | ICD-10-CM

## 2020-04-01 DIAGNOSIS — G8929 Other chronic pain: Secondary | ICD-10-CM

## 2020-04-03 ENCOUNTER — Encounter: Payer: Self-pay | Admitting: Family

## 2020-04-09 ENCOUNTER — Encounter: Payer: Self-pay | Admitting: Family

## 2020-04-10 ENCOUNTER — Other Ambulatory Visit: Payer: Self-pay | Admitting: Family

## 2020-04-10 DIAGNOSIS — M542 Cervicalgia: Secondary | ICD-10-CM

## 2020-04-10 DIAGNOSIS — G8929 Other chronic pain: Secondary | ICD-10-CM

## 2020-04-10 MED ORDER — DULOXETINE HCL 60 MG PO CPEP: 60.0000 mg | ORAL_CAPSULE | Freq: Every day | ORAL | 1 refills | Status: DC

## 2020-04-20 ENCOUNTER — Other Ambulatory Visit: Payer: Self-pay | Admitting: Family

## 2020-04-20 DIAGNOSIS — G8929 Other chronic pain: Secondary | ICD-10-CM

## 2020-04-20 DIAGNOSIS — M545 Low back pain, unspecified: Secondary | ICD-10-CM

## 2020-04-20 NOTE — Telephone Encounter (Signed)
RX Refill:tramadol Last Seen:03-16-20 Last ordered:02-24-20

## 2020-04-30 ENCOUNTER — Telehealth: Payer: Self-pay | Admitting: Family

## 2020-04-30 NOTE — Telephone Encounter (Signed)
I called pt to follow up on PT referral to [facility] PT pt stated that there's a 90.00 co pay for each visit. That's in network but after meeting deductible its a 50.00 copay.

## 2020-04-30 NOTE — Telephone Encounter (Signed)
Thanks for following up Did pt not want to pursue due to cost?

## 2020-05-01 NOTE — Telephone Encounter (Signed)
Noted  

## 2020-05-12 ENCOUNTER — Encounter: Payer: Self-pay | Admitting: Family

## 2020-05-14 NOTE — Telephone Encounter (Signed)
Yes, we can send in zetia Thx TG

## 2020-05-18 ENCOUNTER — Other Ambulatory Visit: Payer: Self-pay | Admitting: Family

## 2020-05-18 ENCOUNTER — Other Ambulatory Visit: Payer: Self-pay

## 2020-05-18 ENCOUNTER — Encounter: Payer: Self-pay | Admitting: Family

## 2020-05-18 DIAGNOSIS — M545 Low back pain, unspecified: Secondary | ICD-10-CM

## 2020-05-18 DIAGNOSIS — G8929 Other chronic pain: Secondary | ICD-10-CM

## 2020-05-18 MED ORDER — EZETIMIBE 10 MG PO TABS: 10.0000 mg | ORAL_TABLET | Freq: Every day | ORAL | 3 refills | Status: DC

## 2020-05-18 NOTE — Telephone Encounter (Signed)
RX Refill:lyrica Last Seen:03-16-20 Last ordered:02-24-20

## 2020-05-18 NOTE — Telephone Encounter (Signed)
Dr. Mariah Milling advise pt may start Zetia 10 mg QD, script sent to pharmacy. Message sent back to pt via MyChart confirming okay to start per Dr. Mariah Milling.

## 2020-06-09 ENCOUNTER — Encounter: Payer: Self-pay | Admitting: Family

## 2020-06-17 ENCOUNTER — Encounter: Payer: Self-pay | Admitting: Family

## 2020-06-17 ENCOUNTER — Other Ambulatory Visit: Payer: Self-pay

## 2020-06-17 ENCOUNTER — Telehealth (INDEPENDENT_AMBULATORY_CARE_PROVIDER_SITE_OTHER): Payer: 59 | Admitting: Family

## 2020-06-17 VITALS — Ht 74.0 in

## 2020-06-17 DIAGNOSIS — R140 Abdominal distension (gaseous): Secondary | ICD-10-CM

## 2020-06-17 DIAGNOSIS — M545 Low back pain, unspecified: Secondary | ICD-10-CM

## 2020-06-17 DIAGNOSIS — F419 Anxiety disorder, unspecified: Secondary | ICD-10-CM

## 2020-06-17 DIAGNOSIS — Z125 Encounter for screening for malignant neoplasm of prostate: Secondary | ICD-10-CM

## 2020-06-17 DIAGNOSIS — I2584 Coronary atherosclerosis due to calcified coronary lesion: Secondary | ICD-10-CM

## 2020-06-17 DIAGNOSIS — I2510 Atherosclerotic heart disease of native coronary artery without angina pectoris: Secondary | ICD-10-CM

## 2020-06-17 DIAGNOSIS — M542 Cervicalgia: Secondary | ICD-10-CM | POA: Diagnosis not present

## 2020-06-17 DIAGNOSIS — G8929 Other chronic pain: Secondary | ICD-10-CM

## 2020-06-17 MED ORDER — TRAMADOL HCL 50 MG PO TABS: ORAL_TABLET | ORAL | 1 refills | Status: DC

## 2020-06-17 MED ORDER — BUSPIRONE HCL 5 MG PO TABS
5.0000 mg | ORAL_TABLET | Freq: Three times a day (TID) | ORAL | 1 refills | Status: DC | PRN
Start: 2020-06-17 — End: 2020-07-28

## 2020-06-17 MED ORDER — DULOXETINE HCL 60 MG PO CPEP: 60.0000 mg | ORAL_CAPSULE | Freq: Every day | ORAL | 1 refills | Status: DC

## 2020-06-17 MED ORDER — PREGABALIN 150 MG PO CAPS
ORAL_CAPSULE | ORAL | 1 refills | Status: DC
Start: 2020-06-17 — End: 2020-07-13

## 2020-06-17 NOTE — Assessment & Plan Note (Signed)
New symptom. Pending Ct abdomen w/o contrast to look for gross abdominal pathology ; wo contrast due to shortage and also patient requests no contrast. Question if GERD playing a role. Continue nexium 20mg  bid and advised to return cologuard asap.  Close follow up.

## 2020-06-17 NOTE — Assessment & Plan Note (Signed)
Uncontrolled. Panic attacks. Continue cymbalta 30mg . Start buspar 5mg  TID prn. Close follow up.

## 2020-06-17 NOTE — Progress Notes (Signed)
Verbal consent for services obtained from patient prior to services given to TELEPHONE visit:   Location of call:  provider at work patient at home  Names of all persons present for services: Philip Paris, NP and patient Follow up  Left low back pain continues and he is interested in medication changes. Waxes and comes with episodic flare ups.  He has received promotion at work at [employer] and is now more sedentary at desk than he has been in the past without improvement in back pain. No new injury.  No numbness in groin or leg. NO trouble urinating or having bowel movements. No constipation, trouble urinating, hesitation with urinating.   No identifiable triggers for back pain. He would like to have done Xray thoracic spine which was not done earlier this year.   He declines PT at this time due to cost.   Complaint with tramadol 164m BID, cymbalta 665m lyrica 15088mID. One week ago, he accidentally took 300m70m the morning of lyrica with 2 more doses of lyrica 150mg20mer than day with great improvement of pain. He would like to increase lyrica.   Complains of intermittent abdominal bloating, intermittent for 4 weeks. Occurs after eating and  thinks may be related to reflux. No food triggers. Compliant with nexium 20mg 4mdenies epigastric burning.  No trouble swallowing,  fever, abdominal pain, constipation, diarrhea.    Anxiety- increased of late with new position at work and worry regarding son. Describes break through anxiety and panic attacks. Compliant with cymbalta 60mg. 10m- he never started zetia as prescribed by Philip Gollan Rockey Situesn't plan to at this time.   Due colonoscopy. He has cologuard at home and plans to return kit.   A/P/next steps:  Problem List Items Addressed This Visit       Cardiovascular and Mediastinum   Coronary artery calcification   Relevant Orders   Lipid panel     Other   Abdominal bloating    New symptom. Pending Ct abdomen w/o contrast  to look for gross abdominal pathology ; wo contrast due to shortage and also patient requests no contrast. Question if GERD playing a role. Continue nexium 20mg bi12md advised to return cologuard asap.  Close follow up.        Anxiety    Uncontrolled. Panic attacks. Continue cymbalta 30mg. St31mbuspar 5mg TID p45m Close follow up.        Relevant Medications   DULoxetine (CYMBALTA) 60 MG capsule   busPIRone (BUSPAR) 5 MG tablet   Other Relevant Orders   TSH   VITAMIN D 25 Hydroxy (Vit-D Deficiency, Fractures)   Chronic midline low back pain without sciatica    Chronic, uncontrolled. No radicular symptoms described during visit. Will increase lyrica to maximum daily dose of 600mg /day.66mre are no further increases and if pain is not controlled, will discuss pain management or return to Philip Chasnis witSharlet Salinant.  Pending XR thoracic as not included with Xr cervical and lumbar 3 months ago. Continue tramadol 100mg BID, c69mlta 60mg. Close 64mow up.        Relevant Medications   DULoxetine (CYMBALTA) 60 MG capsule   traMADol (ULTRAM) 50 MG tablet   pregabalin (LYRICA) 150 MG capsule   Other Relevant Orders   DG Thoracic Spine W/Swimmers   CBC with Differential/Platelet   Comprehensive metabolic panel   Chronic neck pain   Relevant Medications   DULoxetine (CYMBALTA) 60 MG capsule   traMADol (ULTRAM) 50 MG  tablet   pregabalin (LYRICA) 150 MG capsule   Other Visit Diagnoses     Abdominal distension (gaseous)    -  Primary   Relevant Orders   Hemoglobin A1c   CT Abdomen Pelvis Wo Contrast   Screening for prostate cancer       Relevant Orders   PSA        I spent 25 min  discussing plan of care over the phone.

## 2020-06-17 NOTE — Assessment & Plan Note (Signed)
Chronic, uncontrolled. No radicular symptoms described during visit. Will increase lyrica to maximum daily dose of 600mg  /day. There are no further increases and if pain is not controlled, will discuss pain management or return to Dr with patient.  Pending XR thoracic as not included with Xr cervical and lumbar 3 months ago. Continue tramadol 100mg  BID, cymbalta 60mg . Close follow up.

## 2020-06-18 ENCOUNTER — Encounter: Payer: Self-pay | Admitting: Emergency Medicine

## 2020-06-18 ENCOUNTER — Other Ambulatory Visit: Payer: Self-pay

## 2020-06-18 ENCOUNTER — Ambulatory Visit
Admission: RE | Admit: 2020-06-18 | Discharge: 2020-06-18 | Disposition: A | Discharge status: Home / Self Care | Payer: 59 | Source: Ambulatory Visit | Attending: Family | Admitting: Family

## 2020-06-18 ENCOUNTER — Ambulatory Visit
Admission: EM | Admit: 2020-06-18 | Discharge: 2020-06-18 | Disposition: A | Discharge status: Home / Self Care | Payer: 59

## 2020-06-18 ENCOUNTER — Other Ambulatory Visit: Payer: Self-pay | Admitting: Family

## 2020-06-18 DIAGNOSIS — R1032 Left lower quadrant pain: Secondary | ICD-10-CM | POA: Insufficient documentation

## 2020-06-18 DIAGNOSIS — K5792 Diverticulitis of intestine, part unspecified, without perforation or abscess without bleeding: Secondary | ICD-10-CM

## 2020-06-18 MED ORDER — IOHEXOL 300 MG/ML  SOLN
100.0000 mL | Freq: Once | INTRAMUSCULAR | Status: AC | PRN
  Administered 2020-06-18: 100 mL via INTRAVENOUS

## 2020-06-18 MED ORDER — CIPROFLOXACIN HCL 500 MG PO TABS: 500.0000 mg | ORAL_TABLET | Freq: Two times a day (BID) | ORAL | 0 refills | Status: AC

## 2020-06-18 MED ORDER — METRONIDAZOLE 500 MG PO TABS: 500.0000 mg | ORAL_TABLET | Freq: Three times a day (TID) | ORAL | 0 refills | Status: AC

## 2020-06-18 NOTE — Addendum Note (Signed)
Addended by: Donney Dice E on: 06/18/2020 01:21 PM   Modules accepted: Orders

## 2020-06-18 NOTE — ED Triage Notes (Signed)
Pt presents with LLQ abd pain that began last night. Denies n/v/d. LBM: 06/17/20 described as normal. Denies dysuria.

## 2020-06-18 NOTE — ED Provider Notes (Signed)
[facility]    CSN: [id number] Arrival date & time: 06/18/20  1242      History   Chief Complaint Chief Complaint  Patient presents with   Abdominal Pain    LLQ    HPI Philip Stephens is a 45 y.o. male who presents with LLQ pain since last night at work which feels different than the pain he was feeling when he saw his PCP yesterday for LLQ pain and L lower back pain. Today pt talked his PCP's office and he told him what he was feeling, and was told to come here today. His PCP ordered an abdominal CT, but he has not scheduled it yet. He ate popcorn last night. His father has had diverticulitis. Pt has never had a colonoscopy. Pain is provoked with walking. Has not had a fever   Past Medical History:  Diagnosis Date   Arthritis    Hypertension     Patient Active Problem List   Diagnosis Date Noted   Abdominal bloating 06/17/2020   COVID-19 12/20/2019   Screening for colon cancer 12/20/2019   ANA positive 06/24/2019   Arthralgia 06/24/2019   Neck pain, musculoskeletal 06/24/2019   Localized swelling of right lower leg 05/28/2019   Right knee pain 05/28/2019   Hand swelling 05/17/2019   Chronic neck pain 04/02/2019   Tinnitus 04/02/2019   Coronary artery calcification 11/14/2018   Epigastric pain 08/03/2018   Dysuria 07/27/2018   Right-sided chest pain 07/27/2018   Impotence 05/25/2018   Numbness in both hands 04/18/2018   Fatigue 03/14/2018   Anxiety 01/15/2018   Gastroesophageal reflux disease 01/15/2018   Chronic midline low back pain without sciatica 01/01/2018    History reviewed. No pertinent surgical history.   Home Medications    Prior to Admission medications   Medication Sig Start Date End Date Taking? Authorizing Provider  busPIRone (BUSPAR) 5 MG tablet Take 1 tablet (5 mg total) by mouth 3 (three) times daily as needed. 06/17/20   Allegra Grana, FNP  Cholecalciferol 25 MCG (1000 UT) capsule Take by mouth.    [provider]   DULoxetine (CYMBALTA) 60 MG capsule Take 1 capsule (60 mg total) by mouth daily. 06/17/20   Allegra Grana, FNP  esomeprazole (NEXIUM) 20 MG capsule Take 1 capsule (20 mg total) by mouth 2 (two) times daily before a meal. 07/25/19   Allegra Grana, FNP  Multiple Vitamin (MULTI-VITAMIN) tablet Take 1 tablet by mouth daily.    [provider]  pregabalin (LYRICA) 150 MG capsule Take 150mg  PO TID, may take additional tablet once per day if needed for pain. Max daily dose 600mg /day 06/17/20   , FNP  traMADol (ULTRAM) 50 MG tablet TAKE TWO TABLETS BY MOUTH EVERY 12 HOURS AS NEEDED FOR PAIN MAY TAKE 1 EXTRA TABLET FOR BREAKTHROUGH PAIN 06/17/20   Allegra Grana, FNP    Family History Family History  Problem Relation Age of Onset   Hyperlipidemia Mother    Anxiety disorder Mother    Hyperlipidemia Father    Hearing loss Father    Diabetes Father    Arthritis Father     Social History Social History   Tobacco Use   Smoking status: Former    Packs/day: 2.00    Years: 28.00    Pack years: 56.00    Types: Cigarettes    Quit date: 02/19/2018    Years since quitting: 2.3   Smokeless tobacco: Former   Tobacco comments:  Does Velo. ( has nicotine , no tobacco).   Substance Use Topics   Alcohol use: Not Currently   Drug use: Never     Allergies   Penicillins   Review of Systems Review of Systems  Constitutional:  Negative for activity change, appetite change, chills, diaphoresis and fever.  Gastrointestinal:  Positive for abdominal pain. Negative for constipation, diarrhea and vomiting.  Genitourinary:  Negative for difficulty urinating, dysuria and flank pain.  Musculoskeletal:  Positive for back pain.       On L SI area  Skin:  Negative for rash.    Physical Exam Triage Vital Signs ED Triage Vitals [06/18/20 1252]  Enc Vitals Group     BP 123/82     Pulse Rate 97     Resp 16     Temp 98.8 F (37.1 C)     Temp Source Oral     SpO2  96 %     Weight      Height      Head Circumference      Peak Flow      Pain Score 3     Pain Loc      Pain Edu?      Excl. in GC?    No data found.  Updated Vital Signs BP 123/82 (BP Location: Right Arm)   Pulse 97   Temp 98.8 F (37.1 C) (Oral)   Resp 16   SpO2 96%   Visual Acuity Right Eye Distance:   Left Eye Distance:   Bilateral Distance:    Right Eye Near:   Left Eye Near:    Bilateral Near:     Physical Exam Vitals and nursing note reviewed.  Constitutional:      General: He is not in acute distress.    Appearance: He is not ill-appearing or toxic-appearing.  HENT:     Head: Normocephalic.     Right Ear: External ear normal.     Left Ear: External ear normal.  Eyes:     General: No scleral icterus.    Extraocular Movements: Extraocular movements intact.     Conjunctiva/sclera: Conjunctivae normal.  Pulmonary:     Effort: Pulmonary effort is normal.  Abdominal:     General: Bowel sounds are normal.     Palpations: Abdomen is soft.     Tenderness: There is abdominal tenderness in the left lower quadrant. There is guarding.  Musculoskeletal:        General: Normal range of motion.     Cervical back: Neck supple.  Skin:    General: Skin is warm and dry.     Findings: No rash.  Neurological:     Mental Status: He is alert and oriented to person, place, and time.     Gait: Gait normal.  Psychiatric:        Mood and Affect: Mood normal.        Behavior: Behavior normal.        Thought Content: Thought content normal.        Judgment: Judgment normal.     UC Treatments / Results  Labs (all labs ordered are listed, but only abnormal results are displayed) Labs Reviewed - No data to display  EKG   Radiology No results found.  Procedures Procedures (including critical care time)  Medications Ordered in UC Medications - No data to display  Initial Impression / Assessment and Plan / UC Course  I have reviewed the triage vital signs and the  nursing notes. I spoke with his PCP Claris Che and she was going to try to change his CT to stat, but due to his insurance being complicated she agreed with me for him to go to ER if acutely ill. Pt initially declined having to go to ER, but changed his mind and decided he would just go to to ER right now. I asked him to call his PCP's office to inform him of this.     Final Clinical Impressions(s) / UC Diagnoses   Final diagnoses:  Abdominal pain, left lower quadrant     Discharge Instructions      You need to have your primary care provider [facility] change your CT order to stat. We dont order CT's here or get them authorized. If you go to the ER right now they can do it.      ED Prescriptions   None    PDMP not reviewed this encounter.   Garey Ham, PA-C 06/18/20 1335

## 2020-06-18 NOTE — Discharge Instructions (Addendum)
You need to have your primary care provider [facility] change your CT order to stat. We dont order CT's here or get them authorized. If you go to the ER right now they can do it.

## 2020-06-18 NOTE — Addendum Note (Signed)
Addended by: Donney Dice E on: 06/18/2020 01:24 PM   Modules accepted: Orders

## 2020-06-19 ENCOUNTER — Telehealth: Payer: Self-pay | Admitting: Family

## 2020-06-19 NOTE — Telephone Encounter (Signed)
Consulted with dr Wyline Mood regarding ct a/p diverticulitis He advised that abx are not routinely prescribed in non toxic patient. He would need colonoscopy in 6-8 weeks  Spoke with pt about acute findings on ct.   Advised to start flagyl, cipro if pain were to worsen, he develop fever, chills.  Start probiotics if he started abx No alcohol use Advised of need of colonoscopy in 6-8 weeks and he states he will call [facility] regarding this

## 2020-07-09 ENCOUNTER — Telehealth: Payer: Self-pay | Admitting: Family

## 2020-07-09 NOTE — Telephone Encounter (Signed)
Noted will wait on triage note.

## 2020-07-09 NOTE — Telephone Encounter (Signed)
Patient called and said he is sun burnt on both legs from last weekend, legs are swollen. No appointments available. Patient was transferred to [facility].

## 2020-07-09 NOTE — Telephone Encounter (Signed)
Providing [facility] documentation. Pt was instructed to treat symptoms at home.

## 2020-07-09 NOTE — Telephone Encounter (Signed)
Patient was seen at [facility] walk-in.

## 2020-07-12 ENCOUNTER — Other Ambulatory Visit: Payer: Self-pay | Admitting: Family

## 2020-07-12 DIAGNOSIS — G8929 Other chronic pain: Secondary | ICD-10-CM

## 2020-07-12 DIAGNOSIS — M545 Low back pain, unspecified: Secondary | ICD-10-CM

## 2020-07-13 ENCOUNTER — Other Ambulatory Visit: Payer: Self-pay | Admitting: Family

## 2020-07-13 DIAGNOSIS — M545 Low back pain, unspecified: Secondary | ICD-10-CM

## 2020-07-13 DIAGNOSIS — G8929 Other chronic pain: Secondary | ICD-10-CM

## 2020-07-13 MED ORDER — PREGABALIN 150 MG PO CAPS: ORAL_CAPSULE | ORAL | 1 refills | Status: DC

## 2020-07-13 NOTE — Telephone Encounter (Signed)
RX Refill:lyrica Last Seen:06-17-20 Last ordered:06-17-20

## 2020-07-13 NOTE — Progress Notes (Signed)
I looked up patient on [place] Controlled Substances Reporting System PMP AWARE and saw no activity that raised concern of inappropriate use.   

## 2020-07-15 ENCOUNTER — Ambulatory Visit
Admission: RE | Admit: 2020-07-15 | Discharge: 2020-07-15 | Disposition: A | Discharge status: Home / Self Care | Payer: 59 | Source: Ambulatory Visit | Attending: Family | Admitting: Family

## 2020-07-15 ENCOUNTER — Other Ambulatory Visit: Payer: Self-pay | Admitting: Family

## 2020-07-15 ENCOUNTER — Other Ambulatory Visit: Payer: Self-pay

## 2020-07-15 DIAGNOSIS — M79604 Pain in right leg: Secondary | ICD-10-CM | POA: Diagnosis not present

## 2020-07-15 IMAGING — US US EXTREM LOW VENOUS*R*
1 series · 14 of 24 positions shown · non-contrast
Comparison: None.

CLINICAL DATA: Right calf and leg pain over the last week.

EXAM:
Right LOWER EXTREMITY VENOUS DOPPLER ULTRASOUND
TECHNIQUE: Gray-scale sonography with compression, as well as color and duplex
ultrasound, were performed to evaluate the deep venous system(s)
from the level of the common femoral vein through the popliteal and
proximal calf veins.

[Series 1: us venous img lower uni right (dvt) · portal-venous · 14 of 33 slices shown]
[im 1/33]
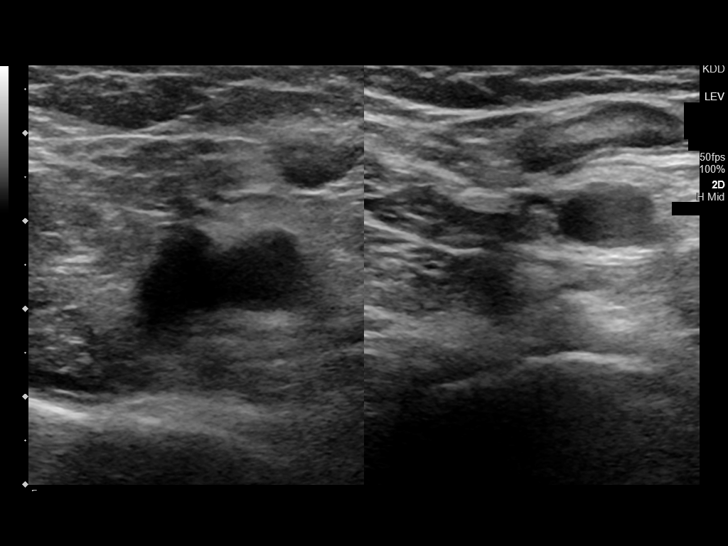
[im 3/33]
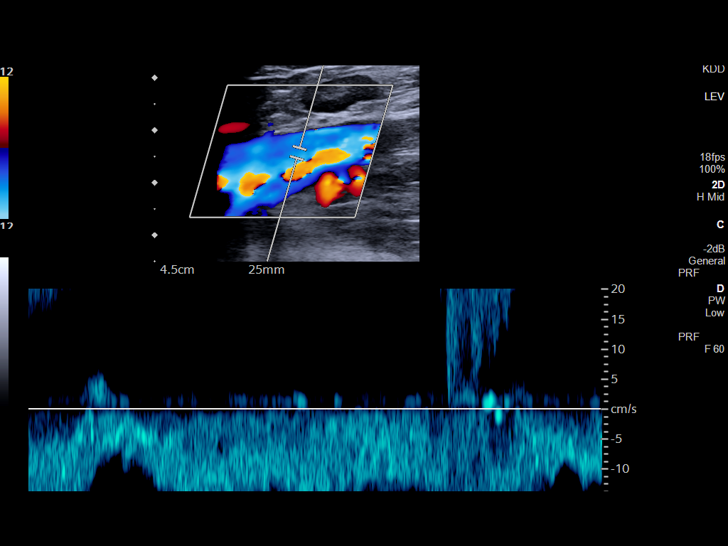
[im 6/33]
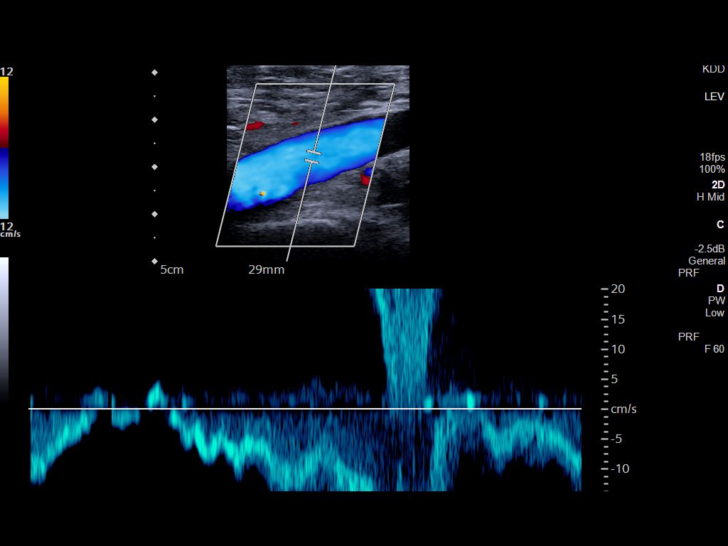
[im 9/33]
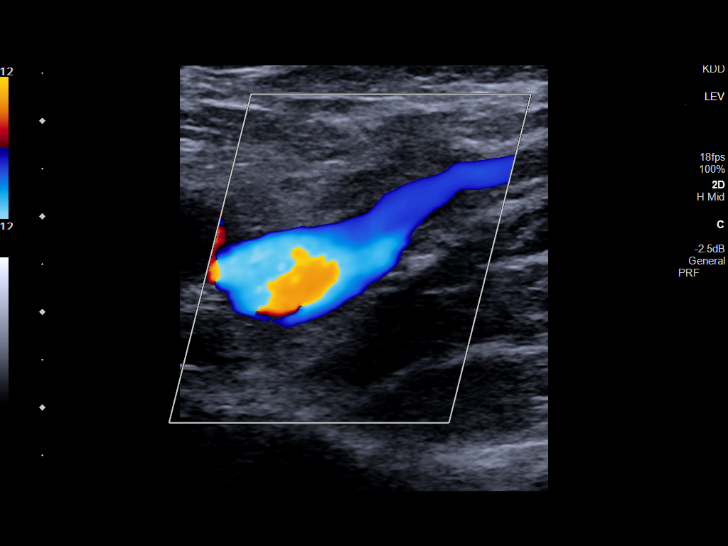
[im 10/33]
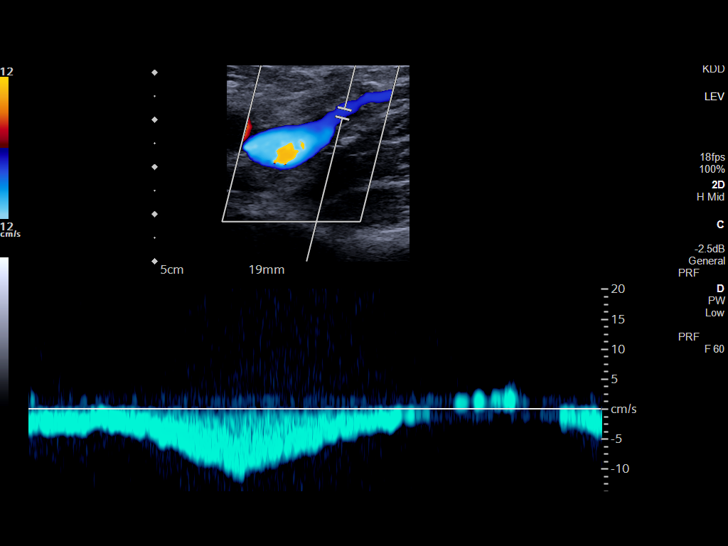
[im 13/33]
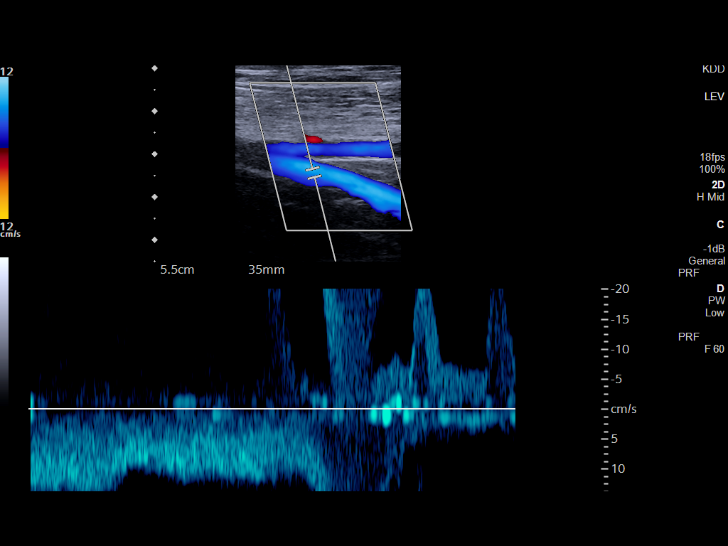
[im 16/33]
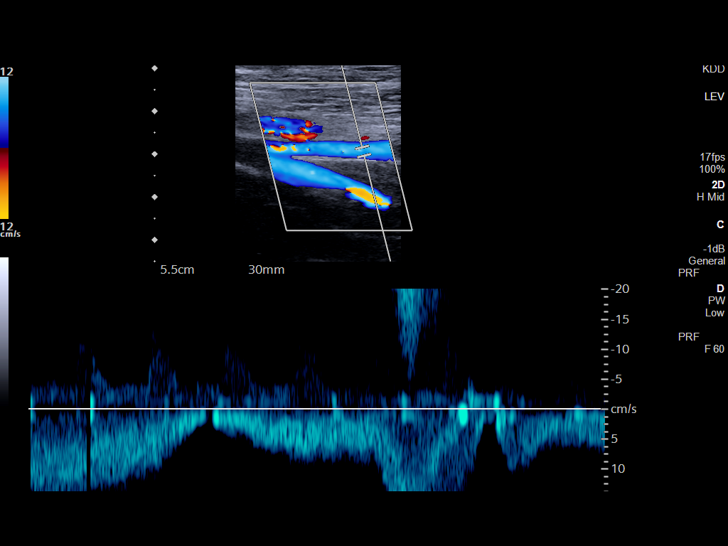
[im 17/33]
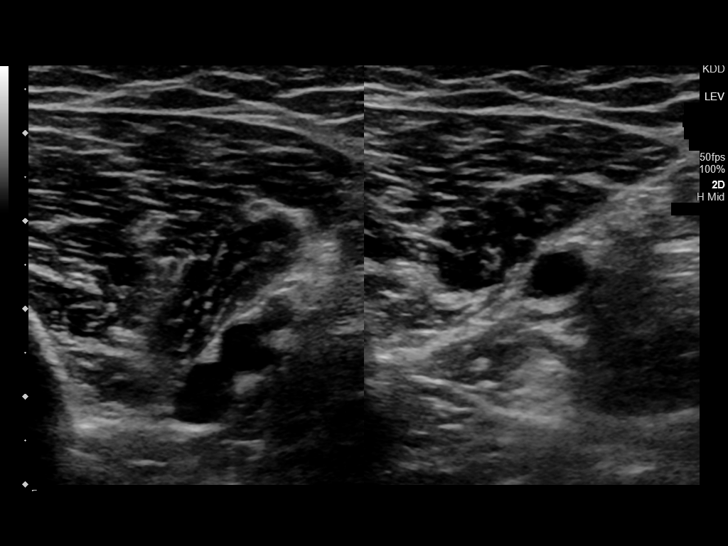
[im 20/33]
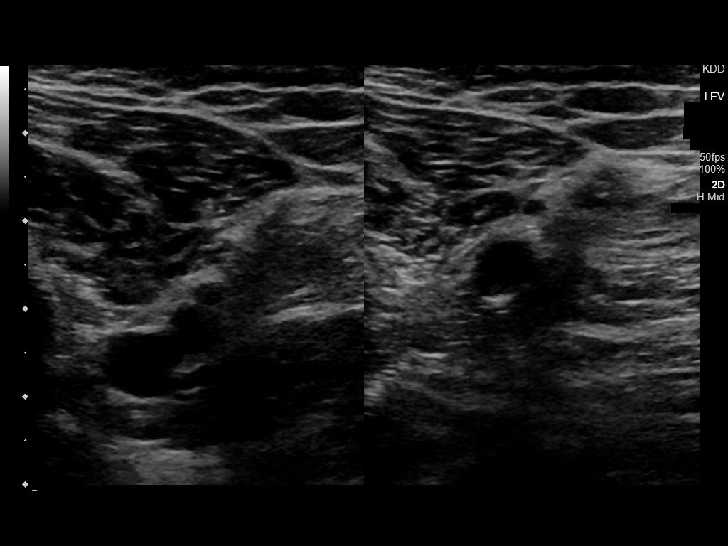
[im 23/33]
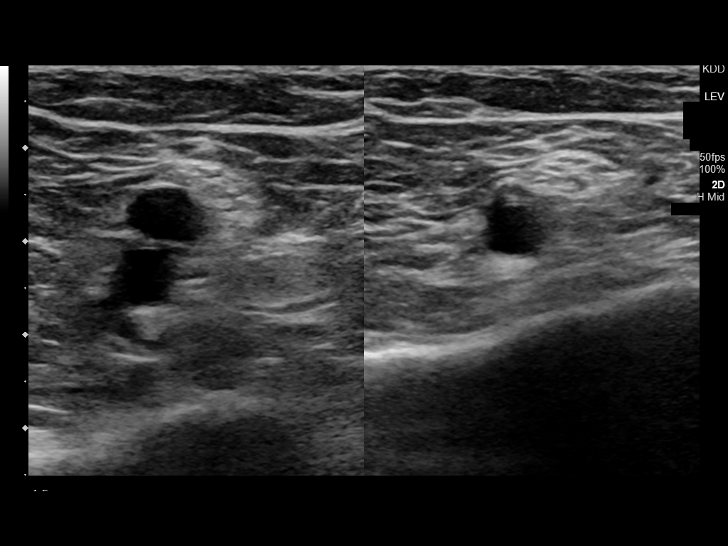
[im 26/33]
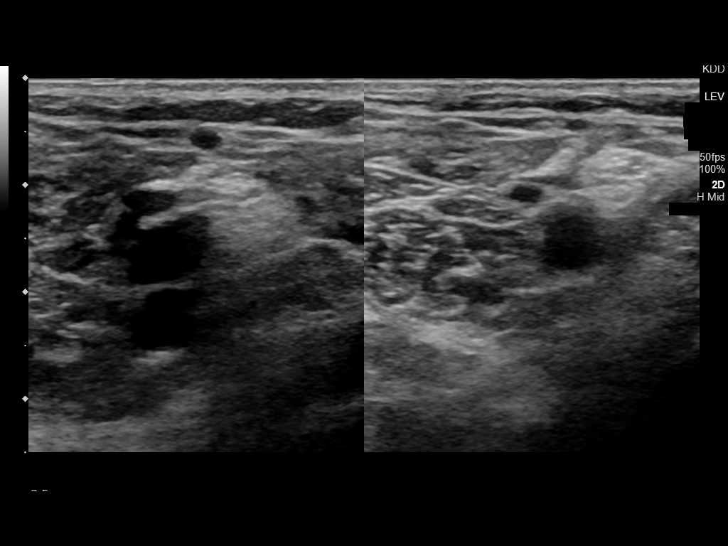
[im 27/33]
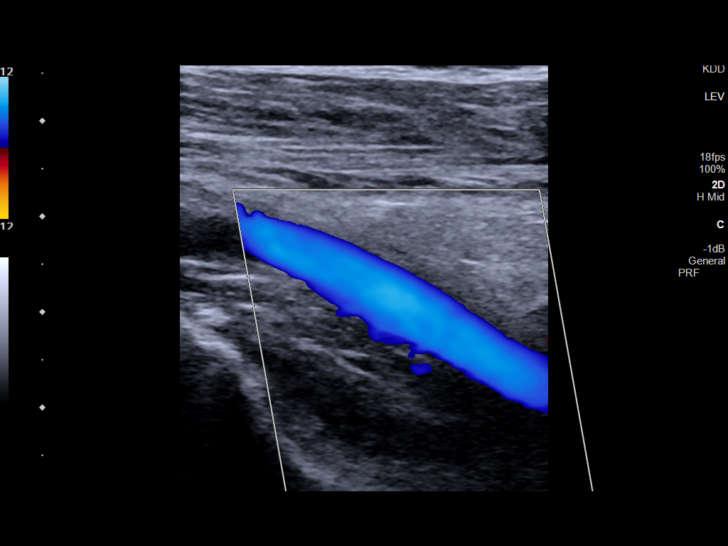
[im 30/33]
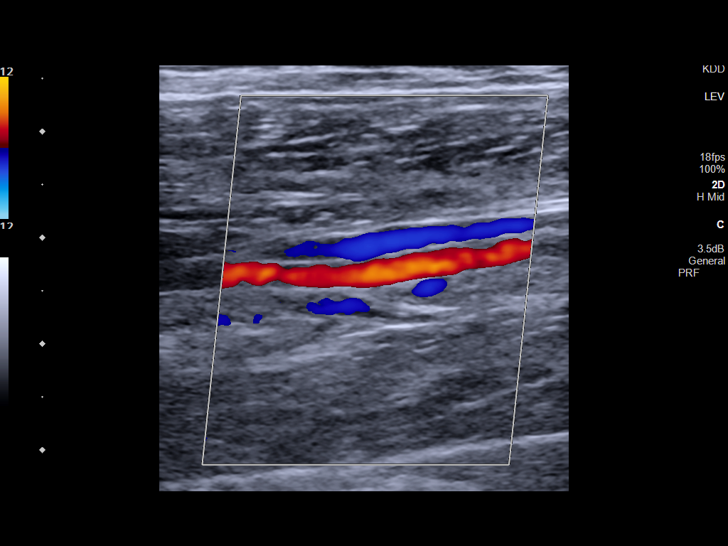
[im 33/33]
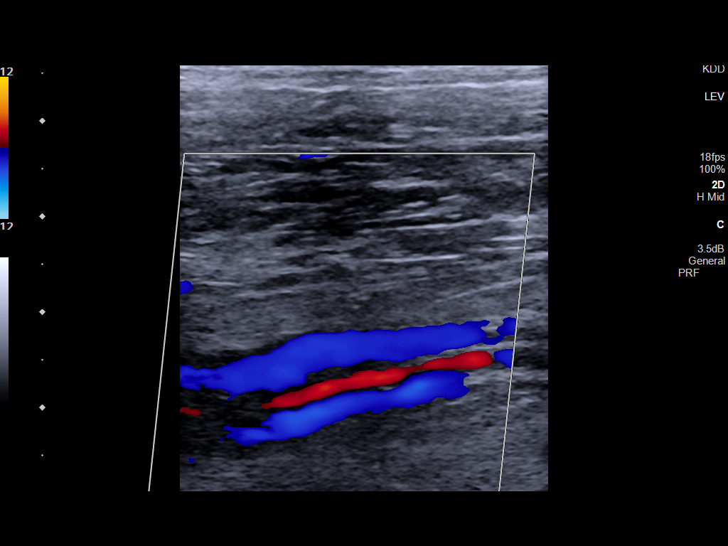

[14 of 24 positions shown; findings below may reference images not displayed]

FINDINGS: VENOUS

Normal compressibility of the common femoral, superficial femoral,
and popliteal veins, as well as the visualized calf veins.
Visualized portions of profunda femoral vein and great saphenous
vein unremarkable. No filling defects to suggest DVT on grayscale or
color Doppler imaging. Doppler waveforms show normal direction of
venous flow, normal respiratory plasticity and response to
augmentation.

Limited views of the contralateral common femoral vein are
unremarkable.

OTHER

None.

Limitations: none
IMPRESSION: Normal examination.  No deep venous thrombosis.

## 2020-07-17 ENCOUNTER — Ambulatory Visit: Payer: 59 | Admitting: Family

## 2020-07-17 ENCOUNTER — Other Ambulatory Visit: Payer: Self-pay

## 2020-07-24 ENCOUNTER — Encounter: Payer: Self-pay | Admitting: Family

## 2020-07-24 ENCOUNTER — Ambulatory Visit (INDEPENDENT_AMBULATORY_CARE_PROVIDER_SITE_OTHER): Payer: 59 | Admitting: Family

## 2020-07-24 ENCOUNTER — Other Ambulatory Visit: Payer: Self-pay

## 2020-07-24 DIAGNOSIS — K219 Gastro-esophageal reflux disease without esophagitis: Secondary | ICD-10-CM | POA: Diagnosis not present

## 2020-07-24 DIAGNOSIS — M545 Low back pain, unspecified: Secondary | ICD-10-CM

## 2020-07-24 DIAGNOSIS — F419 Anxiety disorder, unspecified: Secondary | ICD-10-CM | POA: Diagnosis not present

## 2020-07-24 DIAGNOSIS — G8929 Other chronic pain: Secondary | ICD-10-CM

## 2020-07-24 NOTE — Patient Instructions (Addendum)
Please make a follow up with [facility] after diverticulitis for colonoscopy as well as upper scope.   Back exercises as provided  Have a great weekend!  Low Back Sprain or Strain Rehab Ask your health care provider which exercises are safe for you. Do exercises exactly as told by your health care provider and adjust them as directed. It is normal to feel mild stretching, pulling, tightness, or discomfort as you do these exercises. Stop right away if you feel sudden pain or your pain gets worse. Do not begin these exercises until told by your health care provider. Stretching and range-of-motion exercises These exercises warm up your muscles and joints and improve the movement and flexibility of your back. These exercises also help to relieve pain, numbness,and tingling. Lumbar rotation  Lie on your back on a firm surface and bend your knees. Straighten your arms out to your sides so each arm forms a 90-degree angle (right angle) with a side of your body. Slowly move (rotate) both of your knees to one side of your body until you feel a stretch in your lower back (lumbar). Try not to let your shoulders lift off the floor. Hold this position for __________ seconds. Tense your abdominal muscles and slowly move your knees back to the starting position. Repeat this exercise on the other side of your body. Repeat __________ times. Complete this exercise __________ times a day. Single knee to chest  Lie on your back on a firm surface with both legs straight. Bend one of your knees. Use your hands to move your knee up toward your chest until you feel a gentle stretch in your lower back and buttock. Hold your leg in this position by holding on to the front of your knee. Keep your other leg as straight as possible. Hold this position for __________ seconds. Slowly return to the starting position. Repeat with your other leg. Repeat __________ times. Complete this exercise __________ times a  day. Prone extension on elbows  Lie on your abdomen on a firm surface (prone position). Prop yourself up on your elbows. Use your arms to help lift your chest up until you feel a gentle stretch in your abdomen and your lower back. This will place some of your body weight on your elbows. If this is uncomfortable, try stacking pillows under your chest. Your hips should stay down, against the surface that you are lying on. Keep your hip and back muscles relaxed. Hold this position for __________ seconds. Slowly relax your upper body and return to the starting position. Repeat __________ times. Complete this exercise __________ times a day. Strengthening exercises These exercises build strength and endurance in your back. Endurance is theability to use your muscles for a long time, even after they get tired. Pelvic tilt This exercise strengthens the muscles that lie deep in the abdomen. Lie on your back on a firm surface. Bend your knees and keep your feet flat on the floor. Tense your abdominal muscles. Tip your pelvis up toward the ceiling and flatten your lower back into the floor. To help with this exercise, you may place a small towel under your lower back and try to push your back into the towel. Hold this position for __________ seconds. Let your muscles relax completely before you repeat this exercise. Repeat __________ times. Complete this exercise __________ times a day. Alternating arm and leg raises  Get on your hands and knees on a firm surface. If you are on a hard floor,  you may want to use padding, such as an exercise mat, to cushion your knees. Line up your arms and legs. Your hands should be directly below your shoulders, and your knees should be directly below your hips. Lift your left leg behind you. At the same time, raise your right arm and straighten it in front of you. Do not lift your leg higher than your hip. Do not lift your arm higher than your shoulder. Keep your  abdominal and back muscles tight. Keep your hips facing the ground. Do not arch your back. Keep your balance carefully, and do not hold your breath. Hold this position for __________ seconds. Slowly return to the starting position. Repeat with your right leg and your left arm. Repeat __________ times. Complete this exercise __________ times a day. Abdominal set with straight leg raise  Lie on your back on a firm surface. Bend one of your knees and keep your other leg straight. Tense your abdominal muscles and lift your straight leg up, 4-6 inches (10-15 cm) off the ground. Keep your abdominal muscles tight and hold this position for __________ seconds. Do not hold your breath. Do not arch your back. Keep it flat against the ground. Keep your abdominal muscles tense as you slowly lower your leg back to the starting position. Repeat with your other leg. Repeat __________ times. Complete this exercise __________ times a day. Single leg lower with bent knees Lie on your back on a firm surface. Tense your abdominal muscles and lift your feet off the floor, one foot at a time, so your knees and hips are bent in 90-degree angles (right angles). Your knees should be over your hips and your lower legs should be parallel to the floor. Keeping your abdominal muscles tense and your knee bent, slowly lower one of your legs so your toe touches the ground. Lift your leg back up to return to the starting position. Do not hold your breath. Do not let your back arch. Keep your back flat against the ground. Repeat with your other leg. Repeat __________ times. Complete this exercise __________ times a day. Posture and body mechanics Good posture and healthy body mechanics can help to relieve stress in your body's tissues and joints. Body mechanics refers to the movements and positions of your body while you do your daily activities. Posture is part of body mechanics. Good posture means: Your spine is in  its natural S-curve position (neutral). Your shoulders are pulled back slightly. Your head is not tipped forward. Follow these guidelines to improve your posture and body mechanics in youreveryday activities. Standing  When standing, keep your spine neutral and your feet about hip width apart. Keep a slight bend in your knees. Your ears, shoulders, and hips should line up. When you do a task in which you stand in one place for a long time, place one foot up on a stable object that is 2-4 inches (5-10 cm) high, such as a footstool. This helps keep your spine neutral.  Sitting  When sitting, keep your spine neutral and keep your feet flat on the floor. Use a footrest, if necessary, and keep your thighs parallel to the floor. Avoid rounding your shoulders, and avoid tilting your head forward. When working at a desk or a computer, keep your desk at a height where your hands are slightly lower than your elbows. Slide your chair under your desk so you are close enough to maintain good posture. When working at a computer, place  your monitor at a height where you are looking straight ahead and you do not have to tilt your head forward or downward to look at the screen.  Resting When lying down and resting, avoid positions that are most painful for you. If you have pain with activities such as sitting, bending, stooping, or squatting, lie in a position in which your body does not bend very much. For example, avoid curling up on your side with your arms and knees near your chest (fetal position). If you have pain with activities such as standing for a long time or reaching with your arms, lie with your spine in a neutral position and bend your knees slightly. Try the following positions: Lying on your side with a pillow between your knees. Lying on your back with a pillow under your knees. Lifting  When lifting objects, keep your feet at least shoulder width apart and tighten your abdominal  muscles. Bend your knees and hips and keep your spine neutral. It is important to lift using the strength of your legs, not your back. Do not lock your knees straight out. Always ask for help to lift heavy or awkward objects.  This information is not intended to replace advice given to you by your health care provider. Make sure you discuss any questions you have with your healthcare provider. Document Revised: 04/13/2018 Document Reviewed: 01/11/2018 Elsevier Patient Education  2022 [employer].

## 2020-07-24 NOTE — Progress Notes (Signed)
Subjective:    Patient ID: Philip Stephens, male    DOB: January 12, 1975, 45 y.o.   MRN: [id number]  CC: Philip Stephens is a 45 y.o. male who presents today for follow up.   HPI: Feels well today. Accompanied by wife, young son.  Follow up chronic low back pain  He is not  having chest pain or right sided chest pain. He is most bothered by persistence of low back pain. Overtime has gradually worsened. He used to wear a back brace however no longer helping.  No numbness in low back or legs.  No saddle anesthesia.  No trouble controlling bowels or urinating.   He is compliant with Lyrica 150mg  TID ( 600mg  total per day) , cymbalta 60mg . Compliant with BuSpar 5 TID with slight improvement of anxiety. He continues to feel anxious. Work remains significant stressor.   He has resumed smoking 1.17m packs per day and plans to stop without medication.   H/o GERD- compliant with nexium 20mg  bid. No epigastric burning, nausea.   He  plans to follow up with [facility] after diverticulitis for colonoscopy    HISTORY:  Past Medical History:  Diagnosis Date   Arthritis    Hypertension    No past surgical history on file. Family History  Problem Relation Age of Onset   Hyperlipidemia Mother    Anxiety disorder Mother    Hyperlipidemia Father    Hearing loss Father    Diabetes Father    Arthritis Father     Allergies: Penicillins Current Outpatient Medications on File Prior to Visit  Medication Sig Dispense Refill   busPIRone (BUSPAR) 5 MG tablet Take 1 tablet (5 mg total) by mouth 3 (three) times daily as needed. 60 tablet 1   Cholecalciferol 25 MCG (1000 UT) capsule Take by mouth.     DULoxetine (CYMBALTA) 60 MG capsule Take 1 capsule (60 mg total) by mouth daily. 90 capsule 1   esomeprazole (NEXIUM) 20 MG capsule Take 1 capsule (20 mg total) by mouth 2 (two) times daily before a meal. 120 capsule 1   Multiple Vitamin (MULTI-VITAMIN) tablet Take 1 tablet by mouth daily.      pregabalin (LYRICA) 150 MG capsule Take 150mg  PO TID, may take additional tablet once per day if needed for pain. Max daily dose 600mg /day 120 capsule 1   traMADol (ULTRAM) 50 MG tablet TAKE TWO TABLETS BY MOUTH EVERY 12 HOURS AS NEEDED FOR PAIN MAY TAKE 1 EXTRA TABLET FOR BREAKTHROUGH PAIN 150 tablet 1   No current facility-administered medications on file prior to visit.    Social History   Tobacco Use   Smoking status: Former    Packs/day: 2.00    Years: 28.00    Pack years: 56.00    Types: Cigarettes    Quit date: 02/19/2018    Years since quitting: 2.4   Smokeless tobacco: Former   Tobacco comments:    Does Velo. ( has nicotine , no tobacco).   Substance Use Topics   Alcohol use: Not Currently   Drug use: Never    Review of Systems  Constitutional:  Negative for chills and fever.  Respiratory:  Negative for cough.   Cardiovascular:  Negative for chest pain and palpitations.  Gastrointestinal:  Negative for nausea and vomiting.  Musculoskeletal:  Positive for back pain.  Neurological:  Negative for numbness.     Objective:    BP 110/64 (BP Location: Left Arm, Patient Position: Sitting, Cuff Size: Large)  Pulse 100   Temp 98.5 F (36.9 C) (Oral)   Ht 6\' 2"  (1.88 m)   Wt 162 lb 12.8 oz (73.8 kg)   SpO2 96%   BMI 20.90 kg/m  BP Readings from Last 3 Encounters:  07/24/20 110/64  06/18/20 123/82  03/17/20 122/78   Wt Readings from Last 3 Encounters:  07/24/20 162 lb 12.8 oz (73.8 kg)  03/17/20 159 lb (72.1 kg)  03/16/20 161 lb (73 kg)    Physical Exam Vitals reviewed.  Constitutional:      Appearance: He is well-developed.  Cardiovascular:     Rate and Rhythm: Regular rhythm.     Heart sounds: Normal heart sounds.  Pulmonary:     Effort: Pulmonary effort is normal. No respiratory distress.     Breath sounds: Normal breath sounds. No wheezing or rales.  Musculoskeletal:     Lumbar back: No swelling, spasms or tenderness. Normal range of motion.      Comments: Full range of motion with flexion, extension, lateral side bends. No pain, numbness, tingling elicited with single leg raise bilaterally. No rash.  Skin:    General: Skin is warm and dry.  Neurological:     Mental Status: He is alert.  Psychiatric:        Speech: Speech normal.        Behavior: Behavior normal.       Assessment & Plan:   Problem List Items Addressed This Visit   None   I am having Philip Stephens maintain his Multi-Vitamin, Cholecalciferol, esomeprazole, DULoxetine, traMADol, busPIRone, and pregabalin.   No orders of the defined types were placed in this encounter.   Return precautions given.   Risks, benefits, and alternatives of the medications and treatment plan prescribed today were discussed, and patient expressed understanding.   Education regarding symptom management and diagnosis given to patient on AVS.  Continue to follow with 03/18/20, FNP for routine health maintenance.   Allegra Grana and I agreed with plan.   [name], FNP

## 2020-07-28 ENCOUNTER — Other Ambulatory Visit: Payer: Self-pay | Admitting: Family

## 2020-07-28 DIAGNOSIS — F419 Anxiety disorder, unspecified: Secondary | ICD-10-CM

## 2020-07-28 NOTE — Assessment & Plan Note (Signed)
Chronic, overall stable.  We will continue to assess effectiveness of current regimen.  For now patient politely declines making any changes today.  We will continue Cymbalta 60 mg, BuSpar 5 mg 3 times daily.

## 2020-07-28 NOTE — Assessment & Plan Note (Addendum)
Chronic, gradually worsening.  Discussed again with patient my advice to consult with a specialist whether that be returning to physiatry, Dr. Levi Aland, or consult with orthopedics or pain management.  Unfortunately due to his insurance and high deductible, he is unable to afford physical therapy at this time.  We did discuss the importance of strengthening his core and low back.  I  provided him with exercises for home.  He is on maximum therapy as it relates to medications.   again I will continue to encourage him to seek a second opinion as it relates to options for optimization of treatment.  He politely declines at this time and would like to continue medication regimen. Continue Lyrica 150mg  TID ( 600mg  total per day) , cymbalta 60mg . Follow-up in 3 months.

## 2020-07-28 NOTE — Assessment & Plan Note (Signed)
Stable, controlled.  Reiterated the importance of follow-up with [facility] gastroenterology for colonoscopy and EGD, in particular after recent diverticulitis episode.  Patient verbalized understanding of this.  He will call to schedule an appointment.

## 2020-07-29 ENCOUNTER — Ambulatory Visit: Payer: 59 | Admitting: Cardiovascular Disease

## 2020-07-29 NOTE — Progress Notes (Deleted)
Date:  07/29/2020   ID:  Philip Stephens, DOB 07-27-1975, MRN [id number]  Patient Location:  [address] [place] [address]   Provider location:   [facility], [place] office  PCP:  Allegra Grana, FNP  Cardiologist:  [facility]   No chief complaint on file.    History of Present Illness:    Philip Stephens is a 45 y.o. 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 Yves Dill  Prior CV studies:   The following studies were reviewed today:    Past Medical History:  Diagnosis Date   Arthritis    Hypertension    No past surgical history on file.    Allergies:   Penicillins   Social History   Tobacco Use   Smoking status: Former    Packs/day: 2.00    Years: 28.00    Pack years: 56.00    Types: Cigarettes    Quit date: 02/19/2018    Years since quitting: 2.4   Smokeless tobacco: Former   Tobacco comments:     Does Velo. ( has nicotine , no tobacco).   Substance Use Topics   Alcohol use: Not Currently   Drug use: Never     Current Outpatient Medications on File Prior to Visit  Medication Sig Dispense Refill   busPIRone (BUSPAR) 5 MG tablet TAKE 1 TABLET BY MOUTH 3 TIMES DAILY AS NEEDED. 60 tablet 1   Cholecalciferol 25 MCG (1000 UT) capsule Take by mouth.     DULoxetine (CYMBALTA) 60 MG capsule Take 1 capsule (60 mg total) by mouth daily. 90 capsule 1   esomeprazole (NEXIUM) 20 MG capsule Take 1 capsule (20 mg total) by mouth 2 (two) times daily before a meal. 120 capsule 1   Multiple Vitamin (MULTI-VITAMIN) tablet Take 1 tablet by mouth daily.     pregabalin (LYRICA) 150 MG capsule Take 150mg  PO TID, may take additional tablet once per day if needed for pain. Max daily dose 600mg /day 120 capsule 1   traMADol (ULTRAM) 50 MG tablet TAKE TWO TABLETS BY MOUTH EVERY 12 HOURS AS NEEDED FOR PAIN MAY TAKE 1 EXTRA TABLET FOR BREAKTHROUGH PAIN 150 tablet 1   No current facility-administered medications on file prior to visit.     Family Hx: The patient's family history includes Anxiety disorder in his mother; Arthritis in his  father; Diabetes in his father; Hearing loss in his father; Hyperlipidemia in his father and mother.  ROS:   Please see the history of present illness.    Review of Systems  Constitutional: Negative.   HENT: Negative.    Respiratory: Negative.    Cardiovascular: Negative.   Gastrointestinal: Negative.   Musculoskeletal: Negative.   Neurological: Negative.   Psychiatric/Behavioral: Negative.    All other systems reviewed and are negative.   Labs/Other Tests and Data Reviewed:    Recent Labs: 03/13/2020: ALT 10; BUN 13; Creatinine, Ser 1.34; Hemoglobin 13.4; Platelets 279.0; Potassium 4.1; Sodium 137   Recent Lipid Panel Lab Results  Component Value Date/Time   CHOL 218 (H) 03/13/2020 02:09 PM   TRIG 323.0 (H) 03/13/2020 02:09 PM   HDL 34.20 (L) 03/13/2020 02:09 PM    CHOLHDL 6 03/13/2020 02:09 PM   LDLDIRECT 141.0 03/13/2020 02:09 PM    Wt Readings from Last 3 Encounters:  07/24/20 162 lb 12.8 oz (73.8 kg)  03/17/20 159 lb (72.1 kg)  03/16/20 161 lb (73 kg)     Exam:    There were no vitals taken for this visit. Constitutional:  oriented to person, place, and time. No distress.  HENT:  Head: Grossly normal Eyes:  no discharge. No scleral icterus.  Neck: No JVD, no carotid bruits  Cardiovascular: Regular rate and rhythm, no murmurs appreciated Pulmonary/Chest: Clear to auscultation bilaterally, no wheezes or rails Abdominal: Soft.  no distension.  no tenderness.  Musculoskeletal: Normal range of motion Neurological:  normal muscle tone. Coordination normal. No atrophy Skin: Skin warm and dry Psychiatric: normal affect, pleasant  ASSESSMENT & PLAN:    Problem List Items Addressed This Visit   None Musculoskeletal rib pain hurts with pushing on ribs No further workup Atypical in nature Reassurance provided  Coronary calcification Calcium score of 3, minimally elevated No further cardiac work-up needed  Hyperlipidemia Does not want a pill  Smoking We have encouraged him to continue to work on weaning his cigarettes and smoking cessation. He will continue to work on this and does not want any assistance with chantix.    Total encounter time more than 25 minutes  Greater than 50% was spent in counseling and coordination of care with the patient  Disposition: Follow-up as needed   Signed, Julien Nordmann, MD  [facility] [address], [place], [place] [address]

## 2020-07-31 ENCOUNTER — Ambulatory Visit: Payer: 59 | Admitting: Cardiovascular Disease

## 2020-07-31 NOTE — Progress Notes (Deleted)
No show

## 2020-08-03 ENCOUNTER — Encounter: Payer: Self-pay | Admitting: Cardiovascular Disease

## 2020-08-11 ENCOUNTER — Other Ambulatory Visit: Payer: Self-pay

## 2020-08-11 ENCOUNTER — Other Ambulatory Visit (INDEPENDENT_AMBULATORY_CARE_PROVIDER_SITE_OTHER): Payer: 59

## 2020-08-11 DIAGNOSIS — I2510 Atherosclerotic heart disease of native coronary artery without angina pectoris: Secondary | ICD-10-CM

## 2020-08-11 DIAGNOSIS — G8929 Other chronic pain: Secondary | ICD-10-CM | POA: Diagnosis not present

## 2020-08-11 DIAGNOSIS — Z125 Encounter for screening for malignant neoplasm of prostate: Secondary | ICD-10-CM

## 2020-08-11 DIAGNOSIS — R140 Abdominal distension (gaseous): Secondary | ICD-10-CM | POA: Diagnosis not present

## 2020-08-11 DIAGNOSIS — F419 Anxiety disorder, unspecified: Secondary | ICD-10-CM | POA: Diagnosis not present

## 2020-08-11 DIAGNOSIS — M545 Low back pain, unspecified: Secondary | ICD-10-CM | POA: Diagnosis not present

## 2020-08-11 DIAGNOSIS — I2584 Coronary atherosclerosis due to calcified coronary lesion: Secondary | ICD-10-CM

## 2020-08-11 LAB — COMPREHENSIVE METABOLIC PANEL
ALT: 12 U/L (ref 0–53)
AST: 15 U/L (ref 0–37)
Albumin: 4 g/dL (ref 3.5–5.2)
Alkaline Phosphatase: 99 U/L (ref 39–117)
BUN: 16 mg/dL (ref 6–23)
CO2: 26 mEq/L (ref 19–32)
Calcium: 8.8 mg/dL (ref 8.4–10.5)
Chloride: 102 mEq/L (ref 96–112)
Creatinine, Ser: 0.85 mg/dL (ref 0.40–1.50)
GFR: 104.62 mL/min (ref >60.00)
Glucose, Bld: 126 mg/dL — ABNORMAL HIGH (ref 70–99)
Potassium: 4.4 mEq/L (ref 3.5–5.1)
Sodium: 137 mEq/L (ref 135–145)
Total Bilirubin: 0.3 mg/dL (ref 0.2–1.2)
Total Protein: 6.6 g/dL (ref 6.0–8.3)

## 2020-08-11 LAB — LIPID PANEL
Cholesterol: 233 mg/dL — ABNORMAL HIGH (ref 0–200)
HDL: 38.5 mg/dL — ABNORMAL LOW (ref >39.00)
NonHDL: 194.38
Total CHOL/HDL Ratio: 6
Triglycerides: 279 mg/dL — ABNORMAL HIGH (ref 0.0–149.0)
VLDL: 55.8 mg/dL — ABNORMAL HIGH (ref 0.0–40.0)

## 2020-08-11 LAB — CBC WITH DIFFERENTIAL/PLATELET
Basophils Absolute: 0.1 10*3/uL (ref 0.0–0.1)
Basophils Relative: 0.7 % (ref 0.0–3.0)
Eosinophils Absolute: 0.6 10*3/uL (ref 0.0–0.7)
Eosinophils Relative: 6.9 % — ABNORMAL HIGH (ref 0.0–5.0)
HCT: 40.6 % (ref 39.0–52.0)
Hemoglobin: 13.1 g/dL (ref 13.0–17.0)
Lymphocytes Relative: 27 % (ref 12.0–46.0)
Lymphs Abs: 2.5 10*3/uL (ref 0.7–4.0)
MCHC: 32.3 g/dL (ref 30.0–36.0)
MCV: 89.7 fl (ref 78.0–100.0)
Monocytes Absolute: 0.7 10*3/uL (ref 0.1–1.0)
Monocytes Relative: 7.3 % (ref 3.0–12.0)
Neutro Abs: 5.3 10*3/uL (ref 1.4–7.7)
Neutrophils Relative %: 58.1 % (ref 43.0–77.0)
Platelets: 357 10*3/uL (ref 150.0–400.0)
RBC: 4.53 Mil/uL (ref 4.22–5.81)
RDW: 14.1 % (ref 11.5–15.5)
WBC: 9.1 10*3/uL (ref 4.0–10.5)

## 2020-08-11 LAB — TSH: TSH: 2 u[IU]/mL (ref 0.35–5.50)

## 2020-08-11 LAB — PSA: PSA: 1.36 ng/mL (ref 0.10–4.00)

## 2020-08-11 LAB — LDL CHOLESTEROL, DIRECT: Direct LDL: 149 mg/dL

## 2020-08-11 LAB — VITAMIN D 25 HYDROXY (VIT D DEFICIENCY, FRACTURES): VITD: 24.22 ng/mL — ABNORMAL LOW (ref 30.00–100.00)

## 2020-08-11 LAB — HEMOGLOBIN A1C: Hgb A1c MFr Bld: 6.6 % — ABNORMAL HIGH (ref 4.6–6.5)

## 2020-08-14 ENCOUNTER — Other Ambulatory Visit: Payer: Self-pay | Admitting: Family

## 2020-08-14 ENCOUNTER — Other Ambulatory Visit: Payer: Self-pay | Admitting: *Deleted

## 2020-08-14 ENCOUNTER — Encounter: Payer: Self-pay | Admitting: Family

## 2020-08-14 DIAGNOSIS — M545 Low back pain, unspecified: Secondary | ICD-10-CM

## 2020-08-14 DIAGNOSIS — G8929 Other chronic pain: Secondary | ICD-10-CM

## 2020-08-14 NOTE — Telephone Encounter (Signed)
Okay to refill. Please advise. (I accidentally hit print the first time but it did not print)

## 2020-08-17 ENCOUNTER — Other Ambulatory Visit: Payer: Self-pay | Admitting: Family

## 2020-08-17 DIAGNOSIS — M545 Low back pain, unspecified: Secondary | ICD-10-CM

## 2020-08-17 DIAGNOSIS — G8929 Other chronic pain: Secondary | ICD-10-CM

## 2020-08-17 MED ORDER — TRAMADOL HCL 50 MG PO TABS: ORAL_TABLET | ORAL | 2 refills | Status: DC

## 2020-08-17 MED ORDER — PREGABALIN 150 MG PO CAPS: ORAL_CAPSULE | ORAL | 2 refills | Status: DC

## 2020-08-17 NOTE — Telephone Encounter (Signed)
Looks as though this was sent in 08/14/20 but it was printed and not sent electronically into the pharmacy.   Please advise     Disp Refills Start End   traMADol (ULTRAM) 50 MG tablet 150 tablet 1 08/14/2020    Sig: TAKE 2 TABLETS BY MOUTH EVERY 12 HOURS AS NEEDED FOR PAIN. MAY TAKE 1 EXTRA TABLET FOR BREAKTHROUGH PAIN.   Class: Print

## 2020-08-17 NOTE — Progress Notes (Signed)
I looked up patient on [place] Controlled Substances Reporting System PMP AWARE and saw no activity that raised concern of inappropriate use.   

## 2020-09-08 ENCOUNTER — Ambulatory Visit (INDEPENDENT_AMBULATORY_CARE_PROVIDER_SITE_OTHER): Payer: 59 | Admitting: Family

## 2020-09-08 ENCOUNTER — Other Ambulatory Visit: Payer: Self-pay

## 2020-09-08 ENCOUNTER — Encounter: Payer: Self-pay | Admitting: Family

## 2020-09-08 VITALS — BP 110/62 | HR 95 | Temp 98.2°F | Ht 74.0 in | Wt 165.0 lb

## 2020-09-08 DIAGNOSIS — R1032 Left lower quadrant pain: Secondary | ICD-10-CM | POA: Diagnosis not present

## 2020-09-08 DIAGNOSIS — E785 Hyperlipidemia, unspecified: Secondary | ICD-10-CM

## 2020-09-08 DIAGNOSIS — I2584 Coronary atherosclerosis due to calcified coronary lesion: Secondary | ICD-10-CM

## 2020-09-08 DIAGNOSIS — E119 Type 2 diabetes mellitus without complications: Secondary | ICD-10-CM | POA: Diagnosis not present

## 2020-09-08 DIAGNOSIS — I2510 Atherosclerotic heart disease of native coronary artery without angina pectoris: Secondary | ICD-10-CM

## 2020-09-08 DIAGNOSIS — G8929 Other chronic pain: Secondary | ICD-10-CM

## 2020-09-08 DIAGNOSIS — M545 Low back pain, unspecified: Secondary | ICD-10-CM | POA: Diagnosis not present

## 2020-09-08 MED ORDER — METRONIDAZOLE 500 MG PO TABS: 500.0000 mg | ORAL_TABLET | Freq: Three times a day (TID) | ORAL | 0 refills | Status: AC

## 2020-09-08 MED ORDER — CIPROFLOXACIN HCL 500 MG PO TABS: 500.0000 mg | ORAL_TABLET | Freq: Two times a day (BID) | ORAL | 0 refills | Status: AC

## 2020-09-08 NOTE — Progress Notes (Signed)
Subjective:    Patient ID: Philip Stephens, male    DOB: February 15, 1975, 45 y.o.   MRN: [id number]  CC: Philip Stephens is a 45 y.o. male who presents today for acute visit  HPI: Complains of left lower quadrant pain which started 4 days ago. He thinks triggered by popcorn and sesame seeds over the holiday weekend.  No fever, chills, nausea.      DM-  has been drinking 7-8 soft drinks per day the last couple of months. He would like to work on diet.   Chronic neck and back pain- compliant with Lyrica 150mg  TID ( 600mg  total per day) , cymbalta 60mg , tramadol 100mg  BID, with 50mg  prn ( total 250mg  day).   History of diverticulitis 06/18/20. No GI follow up, colonoscopy.   HLD- he is not taking zetia however has the prescription.   HISTORY:  Past Medical History:  Diagnosis Date   Arthritis    Hypertension    No past surgical history on file. Family History  Problem Relation Age of Onset   Anxiety disorder Mother    Hyperlipidemia Father    Hearing loss Father    Diabetes Father    Arthritis Father    Coronary artery disease Maternal Grandfather 81    Allergies: Penicillins Current Outpatient Medications on File Prior to Visit  Medication Sig Dispense Refill   busPIRone (BUSPAR) 5 MG tablet TAKE 1 TABLET BY MOUTH 3 TIMES DAILY AS NEEDED. 60 tablet 1   Cholecalciferol 25 MCG (1000 UT) capsule Take by mouth.     DULoxetine (CYMBALTA) 60 MG capsule Take 1 capsule (60 mg total) by mouth daily. 90 capsule 1   esomeprazole (NEXIUM) 20 MG capsule Take 1 capsule (20 mg total) by mouth 2 (two) times daily before a meal. 120 capsule 1   metoprolol succinate (TOPROL-XL) 25 MG 24 hr tablet Take 25 mg by mouth daily.     Multiple Vitamin (MULTI-VITAMIN) tablet Take 1 tablet by mouth daily.     pregabalin (LYRICA) 150 MG capsule Take 150mg  PO TID, may take additional tablet once per day if needed for pain. Max daily dose 600mg /day 120 capsule 2   traMADol (ULTRAM) 50 MG tablet TAKE 2  TABLETS BY MOUTH EVERY 12 HOURS AS NEEDED FOR PAIN. MAY TAKE 1 EXTRA TABLET FOR BREAKTHROUGH PAIN. 150 tablet 2   No current facility-administered medications on file prior to visit.    Social History   Tobacco Use   Smoking status: Former    Packs/day: 2.00    Years: 28.00    Pack years: 56.00    Types: Cigarettes    Quit date: 02/19/2018    Years since quitting: 2.5   Smokeless tobacco: Former   Tobacco comments:    Does Velo. ( has nicotine , no tobacco).   Substance Use Topics   Alcohol use: Not Currently   Drug use: Never    Review of Systems  Constitutional:  Negative for chills and fever.  Respiratory:  Negative for cough.   Cardiovascular:  Negative for chest pain and palpitations.  Gastrointestinal:  Positive for abdominal pain. Negative for diarrhea, nausea and vomiting.     Objective:    BP 110/62 (BP Location: Left Arm, Patient Position: Sitting, Cuff Size: Large)   Pulse 95   Temp 98.2 F (36.8 C) (Oral)   Ht 6\' 2"  (1.88 m)   Wt 165 lb (74.8 kg)   SpO2 96%   BMI 21.18 kg/m  BP Readings  from Last 3 Encounters:  09/08/20 110/62  07/24/20 110/64  06/18/20 123/82   Wt Readings from Last 3 Encounters:  09/08/20 165 lb (74.8 kg)  07/24/20 162 lb 12.8 oz (73.8 kg)  03/17/20 159 lb (72.1 kg)    Physical Exam Vitals reviewed.  Constitutional:      Appearance: Normal appearance. He is well-developed.  Cardiovascular:     Rate and Rhythm: Regular rhythm.     Heart sounds: Normal heart sounds.  Pulmonary:     Effort: Pulmonary effort is normal. No respiratory distress.     Breath sounds: Normal breath sounds. No wheezing, rhonchi or rales.  Abdominal:     General: Bowel sounds are normal. There is no distension.     Palpations: Abdomen is soft. Abdomen is not rigid. There is no fluid wave or mass.     Tenderness: There is abdominal tenderness in the left lower quadrant. There is no guarding or rebound. Negative signs include Murphy's sign and McBurney's  sign.  Skin:    General: Skin is warm and dry.  Neurological:     Mental Status: He is alert.  Psychiatric:        Speech: Speech normal.        Behavior: Behavior normal.       Assessment & Plan:   Problem List Items Addressed This Visit       Cardiovascular and Mediastinum   Coronary artery calcification - Primary   Relevant Medications   metoprolol succinate (TOPROL-XL) 25 MG 24 hr tablet   Other Relevant Orders   Lipid Profile     Endocrine   Diabetes mellitus without complication (HCC)    Lab Results  Component Value Date   HGBA1C 6.6 (H) 08/11/2020  Discussed indiscretion with soda, and patient is very motivated to stop drinking all soda, sweet tea and would like to manage this with diet.  He politely declines starting medication such as metformin at this time.  Follow-up in 3        Other   Chronic midline low back pain without sciatica    Chronic, stable.  Continue Lyrica 150mg  TID ( 600mg  total per day) , cymbalta 60mg , tramadol 100mg  BID, with 50mg  prn ( total 250mg  day).  Again advised consult with pain management, he politely declines.       HLD (hyperlipidemia)    Long discussion with patient about my concern elevated LDL cholesterol.  He would like to come back when he is fasting to have this repeated.  Advised that if LDL cholesterol remains above 100 based on familial risk factors, current smoker, I would advise a statin over Zetia.  Of course advised he is welcome to discuss with Dr. to see Dr. would prefer Zetia.  In the setting of newly diagnosed diabetes however, I explained that the LDL goal would be less than 70, and a statin is standard of care.  Patient  declines a statin and would like to repeat cholesterol and work on dietary changes.      Relevant Medications   metoprolol succinate (TOPROL-XL) 25 MG 24 hr tablet   LLQ abdominal pain    Patient nontoxic in appearance however absolutely has left lower quadrant pain.  Strongly advised  to have a stat CT abdomen pelvis to be sure that he does not have complications of diverticulitis such as abscess, perforation.  Also concerned that he did not have follow-up from prior episode 2 months ago, and question of diverticulitis was ever  completely resolved. Advised cbc to ensure no gross elevation of WBC.   Politely declines CT a/p, CBC with differential. He would like to come back when fasting to have all his labs done at the same time.  I provided Cipro and Flagyl for patient to start if left lower quadrant pain worsens or he becomes begins to have fever, chills.  He understands to take probiotics and again emphasized the importance of calling [facility] gastroenterology for follow-up, particularly after a second bout of suspected diverticulitis      Relevant Medications   ciprofloxacin (CIPRO) 500 MG tablet   metroNIDAZOLE (FLAGYL) 500 MG tablet   Other Relevant Orders   CBC with Differential/Platelet     I am having Nur C. Schroll start on ciprofloxacin and metroNIDAZOLE. I am also having him maintain his Multi-Vitamin, Cholecalciferol, esomeprazole, DULoxetine, busPIRone, traMADol, pregabalin, and metoprolol succinate.   Meds ordered this encounter  Medications   ciprofloxacin (CIPRO) 500 MG tablet    Sig: Take 1 tablet (500 mg total) by mouth 2 (two) times daily for 10 days.    Dispense:  20 tablet    Refill:  0    Order Specific Question:   Supervising Provider    Answer:   Duncan Dull L [[id number]]   metroNIDAZOLE (FLAGYL) 500 MG tablet    Sig: Take 1 tablet (500 mg total) by mouth 3 (three) times daily for 10 days.    Dispense:  30 tablet    Refill:  0    Order Specific Question:   Supervising Provider    Answer:   Sherlene Shams [[id number]]    Return precautions given.   Risks, benefits, and alternatives of the medications and treatment plan prescribed today were discussed, and patient expressed understanding.   Education regarding symptom management and diagnosis  given to patient on AVS.  Continue to follow with Allegra Grana, FNP for routine health maintenance.   [name] and I agreed with plan.   [name], FNP

## 2020-09-08 NOTE — Assessment & Plan Note (Addendum)
Patient nontoxic in appearance however absolutely has left lower quadrant pain.  Strongly advised to have a stat CT abdomen pelvis to be sure that he does not have complications of diverticulitis such as abscess, perforation.  Also concerned that he did not have follow-up from prior episode 2 months ago, and question of diverticulitis was ever completely resolved. Advised cbc to ensure no gross elevation of WBC.   Politely declines CT a/p, CBC with differential. He would like to come back when fasting to have all his labs done at the same time.  I provided Cipro and Flagyl for patient to start if left lower quadrant pain worsens or he becomes begins to have fever, chills.  He understands to take probiotics and again emphasized the importance of calling [facility] gastroenterology for follow-up, particularly after a second bout of suspected diverticulitis

## 2020-09-08 NOTE — Patient Instructions (Addendum)
You need follow up with gastroenterology for colonoscopy, diverticulitis- please cal to schedule appointment in next 2-4 weeks.   Eliminate soda, limit processed foods, trans and saturated fat.   Start zetia 10mg  and we can discuss crestor at follow up.    Start cipro, flagyl.   Ensure to take probiotics while on antibiotics and also for 2 weeks after completion. This can either be by eating yogurt daily or taking a probiotic supplement over the counter such as Culturelle.It is important to re-colonize the gut with good bacteria and also to prevent any diarrheal infections associated with antibiotic use.    Any worsening of pain, fever , chills, please go to emergency room for CT abdomen right away.

## 2020-09-09 DIAGNOSIS — E119 Type 2 diabetes mellitus without complications: Secondary | ICD-10-CM | POA: Insufficient documentation

## 2020-09-09 DIAGNOSIS — E785 Hyperlipidemia, unspecified: Secondary | ICD-10-CM | POA: Insufficient documentation

## 2020-09-09 NOTE — Assessment & Plan Note (Signed)
Lab Results  Component Value Date   HGBA1C 6.6 (H) 08/11/2020   Discussed indiscretion with soda, and patient is very motivated to stop drinking all soda, sweet tea and would like to manage this with diet.  He politely declines starting medication such as metformin at this time.  Follow-up in 3

## 2020-09-09 NOTE — Assessment & Plan Note (Signed)
Long discussion with patient about my concern elevated LDL cholesterol.  He would like to come back when he is fasting to have this repeated.  Advised that if LDL cholesterol remains above 100 based on familial risk factors, current smoker, I would advise a statin over Zetia.  Of course advised he is welcome to discuss with Dr. Mariah Milling to see Dr. Mariah Milling would prefer Zetia.  In the setting of newly diagnosed diabetes however, I explained that the LDL goal would be less than 70, and a statin is standard of care.  Patient  declines a statin and would like to repeat cholesterol and work on dietary changes.

## 2020-09-09 NOTE — Assessment & Plan Note (Signed)
Chronic, stable.  Continue Lyrica 150mg  TID ( 600mg  total per day) , cymbalta 60mg , tramadol 100mg  BID, with 50mg  prn ( total 250mg  day).  Again advised consult with pain management, he politely declines.

## 2020-09-10 ENCOUNTER — Encounter: Payer: Self-pay | Admitting: Family

## 2020-09-24 ENCOUNTER — Other Ambulatory Visit: Payer: Self-pay | Admitting: Family

## 2020-09-24 DIAGNOSIS — F419 Anxiety disorder, unspecified: Secondary | ICD-10-CM

## 2020-10-22 ENCOUNTER — Encounter: Payer: Self-pay | Admitting: Family

## 2020-11-03 ENCOUNTER — Telehealth (INDEPENDENT_AMBULATORY_CARE_PROVIDER_SITE_OTHER): Payer: 59 | Admitting: Family

## 2020-11-03 ENCOUNTER — Other Ambulatory Visit: Payer: Self-pay

## 2020-11-03 VITALS — Ht 73.0 in | Wt 168.0 lb

## 2020-11-03 DIAGNOSIS — M542 Cervicalgia: Secondary | ICD-10-CM | POA: Diagnosis not present

## 2020-11-03 DIAGNOSIS — E119 Type 2 diabetes mellitus without complications: Secondary | ICD-10-CM

## 2020-11-03 DIAGNOSIS — M545 Low back pain, unspecified: Secondary | ICD-10-CM | POA: Diagnosis not present

## 2020-11-03 DIAGNOSIS — R21 Rash and other nonspecific skin eruption: Secondary | ICD-10-CM | POA: Diagnosis not present

## 2020-11-03 DIAGNOSIS — G8929 Other chronic pain: Secondary | ICD-10-CM

## 2020-11-03 MED ORDER — CLOTRIMAZOLE 1 % EX OINT
1.0000 | TOPICAL_OINTMENT | Freq: Two times a day (BID) | CUTANEOUS | 1 refills | Status: DC
Start: 2020-11-03 — End: 2020-12-14

## 2020-11-03 MED ORDER — DULOXETINE HCL 30 MG PO CPEP: 30.0000 mg | ORAL_CAPSULE | Freq: Every day | ORAL | 0 refills | Status: DC

## 2020-11-03 MED ORDER — TRAMADOL HCL 50 MG PO TABS: ORAL_TABLET | ORAL | 2 refills | Status: DC

## 2020-11-03 MED ORDER — TRIAMCINOLONE ACETONIDE 0.025 % EX CREA: 1.0000 "application " | TOPICAL_CREAM | Freq: Two times a day (BID) | CUTANEOUS | 0 refills | Status: DC

## 2020-11-03 NOTE — Progress Notes (Signed)
Virtual Visit via Video Note  I connected with@  on 11/03/20 at 11:30 AM EDT by a video enabled telemedicine application and verified that I am speaking with the correct person using two identifiers.  Location patient: home Location provider:work  Persons participating in the virtual visit: patient, provider  I discussed the limitations of evaluation and management by telemedicine and the availability of in person appointments. The patient expressed understanding and agreed to proceed.   HPI: Rash noticed right radial side wrist started 2 weeks ago.Rash improving. Circular area, raised with central clearing.  He had one small suspected ingrown hair 'blister' with clear drainage, the whole area is not blister.  Pain with wrist bending to full flexion backwards, otherwise not painful.   Area is not  firm, smooth, rounded, rubbery.Wrist is not increased in warmth. Denies swelling, fall, injury, fever, red streaks.  No h/o eczema , psoriasis.   DM- he has limited soda to one per day.   Chronic low back pain - unchanged. Compliant with with Lyrica 150 mg 3 times daily, Cymbalta 60 mg QOD ( as working on weaning) , tramadol 100 mg BID and tramadol 50mg  as needed. He would like to discontinue cymbalta.    ROS: See pertinent positives and negatives per HPI.    EXAM:  VITALS per patient if applicable: Ht 6\' 1"  (1.854 m)   Wt 168 lb (76.2 kg)   BMI 22.16 kg/m  BP Readings from Last 3 Encounters:  09/08/20 110/62  07/24/20 110/64  06/18/20 123/82   Wt Readings from Last 3 Encounters:  11/03/20 168 lb (76.2 kg)  09/08/20 165 lb (74.8 kg)  07/24/20 162 lb 12.8 oz (73.8 kg)    GENERAL: alert, oriented, appears well and in no acute distress  HEENT: atraumatic, conjunttiva clear, no obvious abnormalities on inspection of external nose and ears  NECK: normal movements of the head and neck  LUNGS: on inspection no signs of respiratory distress, breathing rate appears normal, no  obvious gross SOB, gasping or wheezing  CV: no obvious cyanosis  MS: moves all visible extremities without noticeable abnormality  PSYCH/NEURO: pleasant and cooperative, no obvious depression or anxiety, speech and thought processing grossly intact  Skin: right radial wrist patch, circular, with dry scales appreciated. No erythema, joint swelling, red streaks appreciated.   ASSESSMENT AND PLAN:  Discussed the following assessment and plan:  Problem List Items Addressed This Visit       Endocrine   Diabetes mellitus without complication (HCC) - Primary    Congratulated patient on dietary measures however counseled him on avoidance of soda entirely.  Pending A1c in 1 week's time      Relevant Orders   Hemoglobin A1c   Microalbumin / creatinine urine ratio     Musculoskeletal and Integument   Rash    Difficult to assess over video. Description consistent with tinea corporis.  Advised trial of azole first and if no improvement he may then try triamcinolone cream.  He will let me know if no improvement      Relevant Medications   triamcinolone (KENALOG) 0.025 % cream   Clotrimazole 1 % OINT     Other   Chronic midline low back pain without sciatica    Chronic, stable.Continue Lyrica 150 mg 3 times daily, tramadol 100 mg BID and tramadol 50mg  as needed daily.  To further wean off of Cymbalta, provided him with 30 mg tablets to take for the next 3 to 4 days and then stop.  Relevant Medications   DULoxetine (CYMBALTA) 30 MG capsule   traMADol (ULTRAM) 50 MG tablet   Chronic neck pain   Relevant Medications   DULoxetine (CYMBALTA) 30 MG capsule   traMADol (ULTRAM) 50 MG tablet    -we discussed possible serious and likely etiologies, options for evaluation and workup, limitations of telemedicine visit vs in person visit, treatment, treatment risks and precautions. Pt prefers to treat via telemedicine empirically rather then risking or undertaking an in person visit at this  moment.  .   I discussed the assessment and treatment plan with the patient. The patient was provided an opportunity to ask questions and all were answered. The patient agreed with the plan and demonstrated an understanding of the instructions.   The patient was advised to call back or seek an in-person evaluation if the symptoms worsen or if the condition fails to improve as anticipated.   [name], FNP

## 2020-11-03 NOTE — Assessment & Plan Note (Signed)
Chronic, stable.Continue Lyrica 150 mg 3 times daily, tramadol 100 mg BID and tramadol 50mg  as needed daily.  To further wean off of Cymbalta, provided him with 30 mg tablets to take for the next 3 to 4 days and then stop.

## 2020-11-03 NOTE — Assessment & Plan Note (Signed)
Difficult to assess over video. Description consistent with tinea corporis.  Advised trial of azole first and if no improvement he may then try triamcinolone cream.  He will let me know if no improvement

## 2020-11-03 NOTE — Patient Instructions (Addendum)
Start cymbalta 30mg  daily x 3-4 days , then stop.  Trial clotrimazole cream first ( to treat 'ringworm') and if no improvement, continue steroid cream, triamcinolone.  Please call me if no resolution

## 2020-11-03 NOTE — Assessment & Plan Note (Signed)
Congratulated patient on dietary measures however counseled him on avoidance of soda entirely.  Pending A1c in 1 week's time

## 2020-11-09 ENCOUNTER — Encounter: Payer: Self-pay | Admitting: Family

## 2020-12-07 ENCOUNTER — Telehealth: Payer: Self-pay

## 2020-12-07 ENCOUNTER — Encounter: Payer: Self-pay | Admitting: Family

## 2020-12-07 DIAGNOSIS — R21 Rash and other nonspecific skin eruption: Secondary | ICD-10-CM

## 2020-12-07 NOTE — Addendum Note (Signed)
Addended by: Allegra Grana on: 12/07/2020 10:47 PM   Modules accepted: Orders

## 2020-12-07 NOTE — Telephone Encounter (Signed)
Spoken to patient. Follow up has been scheduled and lab appointment has been scheduled. Dermatology referral has been pended for provider signature.

## 2020-12-07 NOTE — Telephone Encounter (Signed)
noted 

## 2020-12-09 ENCOUNTER — Other Ambulatory Visit: Payer: Self-pay

## 2020-12-09 DIAGNOSIS — Z0000 Encounter for general adult medical examination without abnormal findings: Secondary | ICD-10-CM

## 2020-12-09 DIAGNOSIS — R21 Rash and other nonspecific skin eruption: Secondary | ICD-10-CM

## 2020-12-14 ENCOUNTER — Other Ambulatory Visit: Payer: Self-pay

## 2020-12-14 ENCOUNTER — Ambulatory Visit (INDEPENDENT_AMBULATORY_CARE_PROVIDER_SITE_OTHER): Payer: 59 | Admitting: Family

## 2020-12-14 ENCOUNTER — Ambulatory Visit (INDEPENDENT_AMBULATORY_CARE_PROVIDER_SITE_OTHER): Payer: 59

## 2020-12-14 ENCOUNTER — Encounter: Payer: Self-pay | Admitting: Family

## 2020-12-14 VITALS — BP 124/76 | HR 94 | Temp 97.7°F | Ht 73.0 in | Wt 164.0 lb

## 2020-12-14 DIAGNOSIS — R21 Rash and other nonspecific skin eruption: Secondary | ICD-10-CM | POA: Diagnosis not present

## 2020-12-14 MED ORDER — CLOTRIMAZOLE 1 % EX OINT: 1.0000 "application " | TOPICAL_OINTMENT | Freq: Two times a day (BID) | CUTANEOUS | 1 refills | Status: DC

## 2020-12-14 MED ORDER — TRIAMCINOLONE ACETONIDE 0.5 % EX OINT: 1.0000 "application " | TOPICAL_OINTMENT | Freq: Two times a day (BID) | CUTANEOUS | 0 refills | Status: DC

## 2020-12-14 NOTE — Progress Notes (Signed)
Subjective:    Patient ID: Philip Stephens, male    DOB: [birth year], 45 y.o.   MRN: [id number]  CC: Philip Stephens is a 45 y.o. male who presents today for follow up.   HPI: Follow up rash 6 weeks ago, waxed and waned.   Painful rash first appeared right dorsal wrist. Red raised itching and painful rash.   Painful to flexion wrist and itches. Painful for sleeve to touch it. On the perimeter approx 3 discreet raised lesions with clear pus. Non draining. No increased heat, fever, red streaks. No discoloration of nails.  Wrist is not swollen.   He has noticed rash on forehead , since resolved.   He has been triamcinolone with some relief.  He never started clotrimazole 1%   Referral in place to dermatology and appointment scheduled for January 2023.    No h/o gout, RA.        Chronic low back pain-improved. Compliant with Lyrica 150 mg 3 times daily , tramadol 100 mg BID and tramadol 50mg  as needed HISTORY:  Past Medical History:  Diagnosis Date   Arthritis    Hypertension    No past surgical history on file. Family History  Problem Relation Age of Onset   Anxiety disorder Mother    Hyperlipidemia Father    Hearing loss Father    Diabetes Father    Arthritis Father    Coronary artery disease Maternal Grandfather 29    Allergies: Penicillins Current Outpatient Medications on File Prior to Visit  Medication Sig Dispense Refill   Cholecalciferol 25 MCG (1000 UT) capsule Take by mouth.     esomeprazole (NEXIUM) 20 MG capsule Take 1 capsule (20 mg total) by mouth 2 (two) times daily before a meal. 120 capsule 1   ezetimibe (ZETIA) 10 MG tablet Take 10 mg by mouth daily.     metoprolol succinate (TOPROL-XL) 25 MG 24 hr tablet Take 25 mg by mouth daily.     Multiple Vitamin (MULTI-VITAMIN) tablet Take 1 tablet by mouth daily.     pregabalin (LYRICA) 150 MG capsule Take 150mg  PO TID, may take additional tablet once per day if needed for pain. Max daily dose 600mg /day  120 capsule 2   traMADol (ULTRAM) 50 MG tablet TAKE 2 TABLETS BY MOUTH EVERY 12 HOURS AS NEEDED FOR PAIN. MAY TAKE 1 EXTRA TABLET FOR BREAKTHROUGH PAIN. 150 tablet 2   No current facility-administered medications on file prior to visit.    Social History   Tobacco Use   Smoking status: Former    Packs/day: 2.00    Years: 28.00    Pack years: 56.00    Types: Cigarettes    Quit date: 02/19/2018    Years since quitting: 2.8   Smokeless tobacco: Former   Tobacco comments:    Does Velo. ( has nicotine , no tobacco).   Substance Use Topics   Alcohol use: Not Currently   Drug use: Never    Review of Systems  Constitutional:  Negative for chills and fever.  HENT:  Negative for congestion, ear pain, rhinorrhea, sinus pressure and sore throat.   Respiratory:  Negative for cough, shortness of breath and wheezing.   Cardiovascular:  Negative for chest pain and palpitations.  Gastrointestinal:  Negative for diarrhea, nausea and vomiting.  Genitourinary:  Negative for dysuria.  Musculoskeletal:  Negative for arthralgias, back pain (controlled at this time) and myalgias.  Skin:  Positive for rash.  Neurological:  Negative for numbness and headaches.  Hematological:  Negative for adenopathy.     Objective:    BP 124/76 (BP Location: Left Arm, Patient Position: Sitting, Cuff Size: Normal)   Pulse 94   Temp 97.7 F (36.5 C) (Oral)   Ht 6\' 1"  (1.854 m)   Wt 164 lb (74.4 kg)   SpO2 98%   BMI 21.64 kg/m  BP Readings from Last 3 Encounters:  12/14/20 124/76  09/08/20 110/62  07/24/20 110/64   Wt Readings from Last 3 Encounters:  12/14/20 164 lb (74.4 kg)  11/03/20 168 lb (76.2 kg)  09/08/20 165 lb (74.8 kg)    Physical Exam Vitals reviewed.  Constitutional:      Appearance: He is well-developed.  Cardiovascular:     Rate and Rhythm: Regular rhythm.     Heart sounds: Normal heart sounds.  Pulmonary:     Effort: Pulmonary effort is normal. No respiratory distress.     Breath  sounds: Normal breath sounds. No wheezing, rhonchi or rales.  Skin:    General: Skin is warm and dry.          Comments: Raised erythematous circular patch. Rim noted. 3 papules noted along edge of patch. Nonpurulent. No red streaks or increase warmth.  No discoloration of nailbeds  Neurological:     Mental Status: He is alert.  Psychiatric:        Speech: Speech normal.        Behavior: Behavior normal.       Assessment & Plan:   Problem List Items Addressed This Visit       Musculoskeletal and Integument   Rash - Primary    Waxes and waned.  No systemic features.  Concern for tinea corporis differential also includes atopic dermatitis, psoriasis.Marland KitchenPending rheumatologic labs, uric acid, x-ray of wrist. Advised first trial topical clotrimazole ointment on right wrist for a couple of days FIRST to ensure that this rash is not yeast in nature ( 'ringworm').  If no resolution, advised to use triamcinolone 0.25%  cream if reappears face. Advised to use  triamcinolone 0.5% on your right wrist rash.  Advised that higher potency steroid creams can cause discoloration and thinning of the skin.  He has an appointment dermatology next month and we are working to move this appointment up.  Patient will let me know how he is doing      Relevant Medications   triamcinolone ointment (KENALOG) 0.5 %   Clotrimazole 1 % OINT   Other Relevant Orders   DG Wrist Complete Right   ANA   C-reactive protein   CYCLIC CITRUL PEPTIDE ANTIBODY, IGG/IGA   Rheumatoid factor   Sedimentation rate   Uric acid     I have discontinued Nyheim C. Paquette's DULoxetine and triamcinolone. I am also having him start on triamcinolone ointment. Additionally, I am having him maintain his Multi-Vitamin, Cholecalciferol, esomeprazole, pregabalin, metoprolol succinate, ezetimibe, traMADol, and Clotrimazole.   Meds ordered this encounter  Medications   triamcinolone ointment (KENALOG) 0.5 %    Sig: Apply 1 application  topically 2 (two) times daily.    Dispense:  15 g    Refill:  0    Order Specific Question:   Supervising Provider    Answer:   Deborra Medina L [[id number]]   Clotrimazole 1 % OINT    Sig: Apply 1 application topically 2 (two) times daily.    Dispense:  56.7 g    Refill:  1    Order Specific Question:   Supervising Provider  Answer:   Crecencio Mc [[id number]]    Return precautions given.   Risks, benefits, and alternatives of the medications and treatment plan prescribed today were discussed, and patient expressed understanding.   Education regarding symptom management and diagnosis given to patient on AVS.  Continue to follow with Burnard Hawthorne, FNP for routine health maintenance.   [name] and I agreed with plan.   Mable Paris, FNP

## 2020-12-14 NOTE — Patient Instructions (Addendum)
Please make follow up with [facility] after diverticulitis.   Please trial topical clotrimazole ointment on right wrist for a couple of days FIRST to ensure that this rash is not yeast in nature ( 'ringworm').   Use triamcinolone 0.25%  cream if you have rash that reappears on your face or external genitals.  Please use higher potency steroid triamcinolone 0.5% on your right wrist rash.  Higher potency steroid creams can cause discoloration and thinning of the skin.  Psoriasis Psoriasis is a long-term (chronic) skin condition. It occurs because your immune system causes skin cells to form too quickly. As a result, too many skin cells grow and create raised, red patches (plaques) that often look silvery on your skin. Plaques may show up anywhere on your body. They can be any size or shape. Symptoms of this condition range from mild to very severe. Psoriasis cannot be passed from one person to another (is not contagious). Sometimes, the symptoms go away and then come back again. What are the causes? The cause of psoriasis is not known, but certain factors can make the condition worse. These include: Damage or trauma to the skin, such as cuts, scrapes, sunburn, and dryness. Not enough exposure to sunlight. Certain medicines. Alcohol. Tobacco use. Stress. Infections caused by bacteria or viruses. What increases the risk? You are more likely to develop this condition if you: Have a family history of psoriasis. Are obese. Are 47-48 years old. Are taking certain medicines. What are the signs or symptoms? There are different types of psoriasis. You can have more than one type of psoriasis during your life. The types are: Plaque. This is the most common. Guttate. This is also called eruptive psoriasis. Inverse. Pustular. Erythrodermic. Sebopsoriasis. Psoriatic arthritis. Each type of psoriasis has different symptoms. Plaque psoriasis symptoms include red, raised plaques with a silvery-white  coating (scale). These plaques may be itchy. Your nails may be pitted and crumbly or fall off. Guttate psoriasis symptoms include small red spots that often show up on your trunk, arms, and legs. These spots may develop after you have been sick, especially with strep throat. Inverse psoriasis symptoms include plaques in your underarm area, under your breasts, or on your genitals, groin, or buttocks. Pustular psoriasis symptoms include pus-filled bumps that are painful, red, and swollen on the palms of your hands or the soles of your feet. You also may feel exhausted, feverish, weak, or have no appetite. Erythrodermic psoriasis symptoms include bright red skin that may look burned. You may have a fast heartbeat and a body temperature that is too high or too low. You may be itchy or in pain. Sebopsoriasis symptoms include red plaques that have a greasy coating, and are often on your scalp, forehead, and face. Psoriatic arthritis causes swollen, painful joints along with scaly skin plaques. How is this diagnosed? This condition is diagnosed based on your symptoms, family history, and a physical exam. You may also be referred to a health care provider who specializes in skin diseases (dermatologist). Your health care provider may remove a tissue sample (biopsy) for testing. How is this treated? There is no cure for this condition, but treatment can help manage it. Goals of treatment include: Helping your skin heal. Reducing itching and inflammation. Slowing the growth of new skin cells. Helping your immune system respond better to your skin. Treatment varies, depending on the severity of your condition. This condition may be treated by: Creams or ointments to help with symptoms. Ultraviolet ray exposure (light  therapy or phototherapy). This may include natural sunlight or light therapy in a medical office. Medicines (systemic therapy). These medicines can help your body better manage skin cell  turnover and inflammation. Medicines may be given in the form of pills or injections. They may be used along with light therapy or ointments. You may also get antibiotic medicines if you have an infection. Follow these instructions at home: Skin Care Moisturize your skin as needed. Only use moisturizers that have been approved by your health care provider. Apply cool, wet cloths (cold compresses) to the affected areas. Do not use a hot tub or take hot showers. Take lukewarm showers and baths. Do not scratch your skin. Lifestyle  Do not use any products that contain nicotine or tobacco, such as cigarettes, e-cigarettes, and chewing tobacco. If you need help quitting, ask your health care provider. Use techniques for stress reduction, such as meditation or yoga. Maintain a healthy weight. Follow instructions from your health care provider for weight control. These may include dietary restrictions. Get safe exposure to the sun as told by your health care provider. This may include spending regular intervals of time outdoors in sunlight. Do not get sunburned. Consider joining a psoriasis support group. Medicines Take or use over-the-counter and prescription medicines only as told by your health care provider. If you were prescribed an antibiotic medicine, take it as told by your health care provider. Do not stop using the antibiotic even if you start to feel better. Alcohol use Limit how much you use: 0-1 drink a day for women. 0-2 drinks a day for men. Be aware of how much alcohol is in your drink. In the [place] one drink equals one 12 oz bottle of beer (355 mL), one 5 oz glass of wine (148 mL), or one 1 oz glass of hard liquor (44 mL). General instructions Keep a journal to help track what triggers an outbreak. Try to avoid any triggers. See a counselor if feelings of sadness, frustration, and hopelessness about your condition are interfering with your work and relationships. Keep all  follow-up visits as told by your health care provider. This is important. Contact a health care provider if: You have a fever. Your pain gets worse. You have increasing redness or warmth in the affected areas. You have new or worsening pain or stiffness in your joints. Your nails start to break easily or pull away from the nail bed. You feel depressed. Summary Psoriasis is a long-term (chronic) skin condition. Patches (plaques) may show up anywhere on your body. There is no cure for this condition, but treatment can help manage it. Treatment varies, depending on the severity of your condition. Keep a journal to track what triggers an outbreak. Try to avoid any triggers. Take or use over-the-counter and prescription medicines only as told by your health care provider. Keep all follow-up visits as told by your health care provider. This is important. This information is not intended to replace advice given to you by your health care provider. Make sure you discuss any questions you have with your health care provider. Document Revised: 10/24/2017 Document Reviewed: 10/24/2017 Elsevier Patient Education  2022 [employer].

## 2020-12-15 ENCOUNTER — Other Ambulatory Visit (INDEPENDENT_AMBULATORY_CARE_PROVIDER_SITE_OTHER): Payer: 59

## 2020-12-15 ENCOUNTER — Telehealth: Payer: Self-pay | Admitting: Family

## 2020-12-15 ENCOUNTER — Other Ambulatory Visit: Payer: Self-pay

## 2020-12-15 DIAGNOSIS — I2510 Atherosclerotic heart disease of native coronary artery without angina pectoris: Secondary | ICD-10-CM | POA: Diagnosis not present

## 2020-12-15 DIAGNOSIS — E119 Type 2 diabetes mellitus without complications: Secondary | ICD-10-CM | POA: Diagnosis not present

## 2020-12-15 DIAGNOSIS — R1032 Left lower quadrant pain: Secondary | ICD-10-CM | POA: Diagnosis not present

## 2020-12-15 DIAGNOSIS — R21 Rash and other nonspecific skin eruption: Secondary | ICD-10-CM

## 2020-12-15 DIAGNOSIS — I2584 Coronary atherosclerosis due to calcified coronary lesion: Secondary | ICD-10-CM

## 2020-12-15 LAB — CBC WITH DIFFERENTIAL/PLATELET
Basophils Absolute: 0 10*3/uL (ref 0.0–0.1)
Basophils Relative: 0.3 % (ref 0.0–3.0)
Eosinophils Absolute: 0.2 10*3/uL (ref 0.0–0.7)
Eosinophils Relative: 1.8 % (ref 0.0–5.0)
HCT: 42.4 % (ref 39.0–52.0)
Hemoglobin: 13.7 g/dL (ref 13.0–17.0)
Lymphocytes Relative: 17.5 % (ref 12.0–46.0)
Lymphs Abs: 2.1 10*3/uL (ref 0.7–4.0)
MCHC: 32.4 g/dL (ref 30.0–36.0)
MCV: 85.1 fl (ref 78.0–100.0)
Monocytes Absolute: 0.9 10*3/uL (ref 0.1–1.0)
Monocytes Relative: 7.1 % (ref 3.0–12.0)
Neutro Abs: 8.8 10*3/uL — ABNORMAL HIGH (ref 1.4–7.7)
Neutrophils Relative %: 73.3 % (ref 43.0–77.0)
Platelets: 273 10*3/uL (ref 150.0–400.0)
RBC: 4.99 Mil/uL (ref 4.22–5.81)
RDW: 17.1 % — ABNORMAL HIGH (ref 11.5–15.5)
WBC: 12.1 10*3/uL — ABNORMAL HIGH (ref 4.0–10.5)

## 2020-12-15 LAB — C-REACTIVE PROTEIN: CRP: <1 mg/dL (ref 0.5–20.0)

## 2020-12-15 LAB — LIPID PANEL
Cholesterol: 174 mg/dL (ref 0–200)
HDL: 33.1 mg/dL — ABNORMAL LOW (ref >39.00)
LDL Cholesterol: 116 mg/dL — ABNORMAL HIGH (ref 0–99)
NonHDL: 140.93
Total CHOL/HDL Ratio: 5
Triglycerides: 124 mg/dL (ref 0.0–149.0)
VLDL: 24.8 mg/dL (ref 0.0–40.0)

## 2020-12-15 LAB — HEMOGLOBIN A1C: Hgb A1c MFr Bld: 6.5 % (ref 4.6–6.5)

## 2020-12-15 LAB — SEDIMENTATION RATE: Sed Rate: 30 mm/hr — ABNORMAL HIGH (ref 0–15)

## 2020-12-15 LAB — URIC ACID: Uric Acid, Serum: 5.3 mg/dL (ref 4.0–7.8)

## 2020-12-15 NOTE — Assessment & Plan Note (Signed)
Waxes and waned.  No systemic features.  Concern for tinea corporis differential also includes atopic dermatitis, psoriasis.Marland KitchenPending rheumatologic labs, uric acid, x-ray of wrist. Advised first trial topical clotrimazole ointment on right wrist for a couple of days FIRST to ensure that this rash is not yeast in nature ( 'ringworm').  If no resolution, advised to use triamcinolone 0.25%  cream if reappears face. Advised to use  triamcinolone 0.5% on your right wrist rash.  Advised that higher potency steroid creams can cause discoloration and thinning of the skin.  He has an appointment dermatology next month and we are working to move this appointment up.  Patient will let me know how he is doing

## 2020-12-15 NOTE — Telephone Encounter (Signed)
Call [facility]  He has appt with derm late January, dr eisenstein.  Are there any earlier appts?   He has rash that is concerning for psoriasis. Very painful and not resolving with topical steriod.   He could see first available including PA if she has availability

## 2020-12-16 ENCOUNTER — Encounter: Payer: Self-pay | Admitting: Family

## 2020-12-16 NOTE — Telephone Encounter (Signed)
Left message for patient to return call to office at [phone].  

## 2020-12-17 ENCOUNTER — Other Ambulatory Visit: Payer: Self-pay

## 2020-12-17 ENCOUNTER — Ambulatory Visit: Payer: 59 | Admitting: Family

## 2020-12-17 ENCOUNTER — Encounter: Payer: Self-pay | Admitting: Family

## 2020-12-17 VITALS — BP 128/84 | HR 85 | Temp 96.6°F | Ht 73.0 in | Wt 166.4 lb

## 2020-12-17 DIAGNOSIS — M5416 Radiculopathy, lumbar region: Secondary | ICD-10-CM

## 2020-12-17 DIAGNOSIS — M79604 Pain in right leg: Secondary | ICD-10-CM | POA: Diagnosis not present

## 2020-12-17 DIAGNOSIS — G8929 Other chronic pain: Secondary | ICD-10-CM

## 2020-12-17 DIAGNOSIS — M545 Low back pain, unspecified: Secondary | ICD-10-CM | POA: Diagnosis not present

## 2020-12-17 LAB — MICROALBUMIN / CREATININE URINE RATIO
Creatinine,U: 222.9 mg/dL
Microalb Creat Ratio: 0.5 mg/g (ref 0.0–30.0)
Microalb, Ur: 1.2 mg/dL (ref 0.0–1.9)

## 2020-12-17 LAB — CYCLIC CITRUL PEPTIDE ANTIBODY, IGG/IGA: Cyclic Citrullin Peptide Ab: 2 units (ref 0–19)

## 2020-12-17 LAB — D-DIMER, QUANTITATIVE: D-Dimer, Quant: <0.19 mcg/mL FEU (ref <0.50)

## 2020-12-17 MED ORDER — PREDNISONE 10 MG (21) PO TBPK: ORAL_TABLET | ORAL | 0 refills | Status: DC

## 2020-12-17 NOTE — Progress Notes (Signed)
Acute Office Visit  Subjective:    Patient ID: Philip Stephens, male    DOB: December 02, 1975, 45 y.o.   MRN: [id number]  Chief Complaint  Patient presents with   Leg Swelling    Right leg and painful    HPI Patient is in today with c/o right leg pain and swelling x 3 days that is better with walking and worse with lying down at night. He reports feeling a strange/different sensation to the right buttocks this time in comparison to when he had similar pain in July 2022.  Denies any redness or warmness. Reports having similar symptoms in July and has an ultrasound that was negative. He has a history of chronic low back pain from an injury in 2009. 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 No fever.   Past Medical History:  Diagnosis Date   Arthritis    Hypertension     No past surgical history on file.  Family History  Problem Relation Age of Onset   Anxiety disorder Mother    Hyperlipidemia Father    Hearing loss Father    Diabetes Father    Arthritis Father    Coronary artery disease Maternal Grandfather 60    Social History   Socioeconomic History   Marital status: Married    Spouse name: Not on file   Number of children: Not on file   Years of education: Not on file   Highest education level: Not on file  Occupational History   Not on file  Tobacco Use   Smoking status: Former    Packs/day: 2.00    Years: 28.00    Pack years: 56.00    Types: Cigarettes    Quit date: 02/19/2018    Years since quitting: 2.8   Smokeless tobacco: Former   Tobacco comments:    Does Velo. ( has nicotine , no tobacco).   Vaping Use   Vaping Use: Not on file  Substance and Sexual Activity   Alcohol use: Not Currently   Drug use: Never   Sexual activity: Yes  Other Topics Concern   Not on file  Social History Narrative   Works at [employer].    2 boys- 7 and 15   1 daughter 7   Social Determinants of Corporate investment banker Strain: Not on  file  Food Insecurity: Not on file  Transportation Needs: Not on file  Physical Activity: Not on file  Stress: Not on file  Social Connections: Not on file  Intimate Partner Violence: Not on file    Outpatient Medications Prior to Visit  Medication Sig Dispense Refill   Cholecalciferol 25 MCG (1000 UT) capsule Take by mouth.     Clotrimazole 1 % OINT Apply 1 application topically 2 (two) times daily. 56.7 g 1   esomeprazole (NEXIUM) 20 MG capsule Take 1 capsule (20 mg total) by mouth 2 (two) times daily before a meal. 120 capsule 1   ezetimibe (ZETIA) 10 MG tablet Take 10 mg by mouth daily.     metoprolol succinate (TOPROL-XL) 25 MG 24 hr tablet Take 25 mg by mouth daily.     Multiple Vitamin (MULTI-VITAMIN) tablet Take 1 tablet by mouth daily.     pregabalin (LYRICA) 150 MG capsule Take 150mg  PO TID, may take additional tablet once per day if needed for pain. Max daily dose 600mg /day 120 capsule 2   traMADol (ULTRAM) 50 MG tablet TAKE 2 TABLETS BY MOUTH EVERY 12  HOURS AS NEEDED FOR PAIN. MAY TAKE 1 EXTRA TABLET FOR BREAKTHROUGH PAIN. 150 tablet 2   triamcinolone ointment (KENALOG) 0.5 % Apply 1 application topically 2 (two) times daily. 15 g 0   No facility-administered medications prior to visit.    Allergies  Allergen Reactions   Penicillins Swelling    Review of Systems  Constitutional: Negative.   Respiratory: Negative.    Cardiovascular: Negative.   Endocrine: Negative.   Musculoskeletal:  Positive for arthralgias and back pain.       Right leg pain and swelling  Skin: Negative.   Neurological:  Negative for dizziness and numbness.  Psychiatric/Behavioral: Negative.    All other systems reviewed and are negative.     Objective:    Physical Exam Vitals and nursing note reviewed.  Constitutional:      Appearance: Normal appearance.  Cardiovascular:     Rate and Rhythm: Normal rate and regular rhythm.     Pulses: Normal pulses.     Heart sounds: Normal  heart sounds.  Pulmonary:     Effort: Pulmonary effort is normal.     Breath sounds: Normal breath sounds.  Musculoskeletal:        General: Tenderness present. Normal range of motion.     Right lower leg: No edema.     Left lower leg: No edema.     Comments: Neg SLR. ROM of the back is all normal, no pain. Tenderness to palpation of the right buttocks. Mild calf tenderness. Negative homan sign. Mild swelling. Pulse 2/2 popliteal and dorsalis pedis.   Skin:    General: Skin is warm and dry.  Neurological:     General: No focal deficit present.     Mental Status: He is alert and oriented to person, place, and time.  Psychiatric:        Mood and Affect: Mood normal.        Behavior: Behavior normal.   BP 128/84    Pulse 85    Temp (!) 96.6 F (35.9 C) (Skin)    Ht 6\' 1"  (1.854 m)    Wt 166 lb 6.4 oz (75.5 kg)    SpO2 97%    BMI 21.95 kg/m  Wt Readings from Last 3 Encounters:  12/17/20 166 lb 6.4 oz (75.5 kg)  12/14/20 164 lb (74.4 kg)  11/03/20 168 lb (76.2 kg)    Health Maintenance Due  Topic Date Due   Pneumococcal Vaccine 72-64 Years old (1 - PCV) Never done   FOOT EXAM  Never done   URINE MICROALBUMIN  Never done   HIV Screening  Never done   Hepatitis C Screening  Never done   COLONOSCOPY (Pts 45-49yrs Insurance coverage will need to be confirmed)  Never done    There are no preventive care reminders to display for this patient.   Lab Results  Component Value Date   TSH 2.00 08/11/2020   Lab Results  Component Value Date   WBC 12.1 (H) 12/15/2020   HGB 13.7 12/15/2020   HCT 42.4 12/15/2020   MCV 85.1 12/15/2020   PLT 273.0 12/15/2020   Lab Results  Component Value Date   NA 137 08/11/2020   K 4.4 08/11/2020   CO2 26 08/11/2020   GLUCOSE 126 (H) 08/11/2020   BUN 16 08/11/2020   CREATININE 0.85 08/11/2020   BILITOT 0.3 08/11/2020   ALKPHOS 99 08/11/2020   AST 15 08/11/2020   ALT 12 08/11/2020   PROT 6.6 08/11/2020   ALBUMIN  4.0 08/11/2020    CALCIUM 8.8 08/11/2020   ANIONGAP 7 11/26/2017   GFR 104.62 08/11/2020   Lab Results  Component Value Date   CHOL 174 12/15/2020   Lab Results  Component Value Date   HDL 33.10 (L) 12/15/2020   Lab Results  Component Value Date   LDLCALC 116 (H) 12/15/2020   Lab Results  Component Value Date   TRIG 124.0 12/15/2020   Lab Results  Component Value Date   CHOLHDL 5 12/15/2020   Lab Results  Component Value Date   HGBA1C 6.5 12/15/2020       Assessment & Plan:   Problem List Items Addressed This Visit     Chronic midline low back pain without sciatica   Relevant Medications   predniSONE (STERAPRED UNI-PAK 21 TAB) 10 MG (21) TBPK tablet   Other Visit Diagnoses     Lumbar radiculopathy    -  Primary   Right leg pain       Relevant Orders   D-Dimer, Quantitative        Meds ordered this encounter  Medications   predniSONE (STERAPRED UNI-PAK 21 TAB) 10 MG (21) TBPK tablet    Sig: As directed    Dispense:  21 tablet    Refill:  0   Plan: [place] to be reconsidered if D-dimer is positive. However, considering the pain is originating from the right buttocks.   Eulis Foster, FNP

## 2020-12-17 NOTE — Telephone Encounter (Signed)
No sooner available appointments. Patient was notified. Instructed to call office for cancerations.  Patient did see padonda today in office.

## 2020-12-19 ENCOUNTER — Telehealth: Payer: Managed Care, Other (non HMO) | Admitting: Family

## 2020-12-20 LAB — RHEUMATOID FACTOR: Rheumatoid fact SerPl-aCnc: <14 IU/mL (ref <14)

## 2020-12-20 LAB — ANA: Anti Nuclear Antibody (ANA): NEGATIVE

## 2020-12-21 ENCOUNTER — Other Ambulatory Visit: Payer: Self-pay | Admitting: Family

## 2020-12-21 ENCOUNTER — Telehealth: Payer: Self-pay

## 2020-12-21 ENCOUNTER — Encounter: Payer: Self-pay | Admitting: Family

## 2020-12-21 DIAGNOSIS — R899 Unspecified abnormal finding in specimens from other organs, systems and tissues: Secondary | ICD-10-CM

## 2020-12-21 NOTE — Telephone Encounter (Signed)
Just FYI patient is scheduled with Marcelino Duster Wednesday at 11:30. He did see Padonda for this issue, but stated that the prednisone was not helpful. He is in a lot of pain & he is wearing compression stockings that do help elevated some pain. I advised that I was not sure what else we could do here since Padonda ruled out blood clot. He thinks that he has a slipped or bulging disk due to numbness.I advised that since he was established with Dr. Yves Dill that he call them back to to see if they could see him. I did not want him to keep coming here when he needed specialty.

## 2020-12-21 NOTE — Telephone Encounter (Signed)
Walker Shadow Espin  P [facility] (supporting Allegra Grana, FNP) 4 minutes ago (10:25 AM)   I can and would be glad to after all this going on with my leg     Pates, Dayton Scrape, CMA  [name] and proxy Roderick Pee [place]) 1 hour ago (8:40 AM)   Cab you come in & see Marcelino Duster this week sometime?   Sarah, CMA     Kanden C Gupta  [facility] (supporting Allegra Grana, FNP) 9 hours ago (12:49 AM)   Also its still swelling up     Dannielle Huh C [place]  [facility] (supporting Allegra Grana, FNP) 9 hours ago (12:48 AM)   Belenda Cruise look Lavenia Atlas been in bad pain all weekend the prednisone helped my rash but Im hurting really worse than Ive ever hurt and its my right leg from like my right butt cheek. If you could can we see whats going on I cant do this very much it hurts bad and you know I never complain like hurting that bad any help you can lend Ill be very happy thank you.

## 2020-12-22 NOTE — Telephone Encounter (Signed)
Noted  

## 2020-12-22 NOTE — Progress Notes (Signed)
No show, he sent message for appointment to be canceled to Donney Dice CMA.

## 2020-12-23 ENCOUNTER — Ambulatory Visit (INDEPENDENT_AMBULATORY_CARE_PROVIDER_SITE_OTHER): Payer: 59 | Admitting: Adult Health

## 2020-12-23 DIAGNOSIS — Z91199 Patient's noncompliance with other medical treatment and regimen due to unspecified reason: Secondary | ICD-10-CM | POA: Insufficient documentation

## 2020-12-25 NOTE — Telephone Encounter (Signed)
Pt didn't come for appt 12/23/20

## 2021-01-01 ENCOUNTER — Other Ambulatory Visit: Payer: 59

## 2021-01-27 ENCOUNTER — Encounter: Payer: Self-pay | Admitting: Family

## 2021-01-27 ENCOUNTER — Other Ambulatory Visit: Payer: Self-pay | Admitting: Family

## 2021-01-27 DIAGNOSIS — M545 Low back pain, unspecified: Secondary | ICD-10-CM

## 2021-01-27 DIAGNOSIS — G8929 Other chronic pain: Secondary | ICD-10-CM

## 2021-02-09 ENCOUNTER — Encounter: Payer: Self-pay | Admitting: Cardiovascular Disease

## 2021-02-09 ENCOUNTER — Ambulatory Visit (INDEPENDENT_AMBULATORY_CARE_PROVIDER_SITE_OTHER): Payer: 59 | Admitting: Cardiovascular Disease

## 2021-02-09 ENCOUNTER — Other Ambulatory Visit: Payer: Self-pay

## 2021-02-09 VITALS — BP 120/72 | HR 69 | Ht 73.0 in | Wt 161.0 lb

## 2021-02-09 DIAGNOSIS — E119 Type 2 diabetes mellitus without complications: Secondary | ICD-10-CM

## 2021-02-09 DIAGNOSIS — I2510 Atherosclerotic heart disease of native coronary artery without angina pectoris: Secondary | ICD-10-CM

## 2021-02-09 DIAGNOSIS — R079 Chest pain, unspecified: Secondary | ICD-10-CM

## 2021-02-09 DIAGNOSIS — I2584 Coronary atherosclerosis due to calcified coronary lesion: Secondary | ICD-10-CM

## 2021-02-09 DIAGNOSIS — E782 Mixed hyperlipidemia: Secondary | ICD-10-CM

## 2021-02-09 NOTE — Patient Instructions (Signed)
Medication Instructions:  No changes  If you need a refill on your cardiac medications before your next appointment, please call your pharmacy.   Lab work: No new labs needed  Testing/Procedures: No new testing needed  Follow-Up: At [facility], you and your health needs are our priority.  As part of our continuing mission to provide you with exceptional heart care, we have created designated Provider Care Teams.  These Care Teams include your primary Cardiologist (physician) and Advanced Practice Providers (APPs -  Physician Assistants and Nurse Practitioners) who all work together to provide you with the care you need, when you need it.  You will need a follow up appointment as needed  Providers on your designated Care Team:   Christopher Berge, NP Ryan Dunn, PA-C Cadence Furth, PA-C  COVID-19 Vaccine Information can be found at: [email or web address] For questions related to vaccine distribution or appointments, please email [email or web address] or call [phone].    

## 2021-02-09 NOTE — Progress Notes (Signed)
Date:  02/10/2021   ID:  Philip Stephens, DOB 1975-02-26, MRN [id number]  Patient Location:  [address] [name] [address]   Provider location:   [facility], [place] office  PCP:  Allegra Grana, FNP  Cardiologist:  [facility]   Chief Complaint  Patient presents with   12 month follow up     Patient c/o fluttering in chest at times and needs a cardiac clearance for a colonoscopy. Medications reviewed by the patient verbally.      History of Present Illness:    Philip Stephens is a 46 y.o. male past medical history of Anxiety panic attacks Chronic back pain Former smoker, quit 02/2018 Coronary calcium score of 3 SVT Who presents for follow-up of his chest pain , palpitations   Last seen in clinic April 2021 Prior history atypical chest pain and evaluated September 2020, musculoskeletal Atypical chest pain symptoms April 2021, evaluated by physiatry  CT scan March 2021, no PE  Reports he is very busy at work, helps manage employees at [employer] Occasional chest discomfort Concerned about recent palpitations  Prior history of smoking on and off Feels anxiety contributing to his symptoms Previously tried Cymbalta, did not work for him  wonders if there are other medications for anxiety  EKG personally reviewed by myself on todays visit Normal sinus rhythm rate 69 bpm no significant ST-T wave changes    Past Medical History:  Diagnosis Date   Arthritis    Hypertension    History reviewed. No pertinent surgical history.    Allergies:   Penicillins   Social History   Tobacco Use   Smoking status: Every Day    Packs/day: 1.00    Years: 28.00    Pack years: 28.00    Types: Cigarettes    Last attempt to quit: 02/19/2018    Years since quitting: 2.9   Smokeless tobacco: Former   Tobacco comments:    Does Velo. ( has nicotine , no tobacco).   Substance Use Topics   Alcohol use: Not Currently   Drug use:  Never     Current Outpatient Medications on File Prior to Visit  Medication Sig Dispense Refill   Aspirin-Salicylamide-Caffeine (ARTHRITIS STRENGTH BC POWDER PO) Take by mouth daily as needed.     Cholecalciferol 25 MCG (1000 UT) capsule Take by mouth.     esomeprazole (NEXIUM) 20 MG capsule Take 1 capsule (20 mg total) by mouth 2 (two) times daily before a meal. 120 capsule 1   ezetimibe (ZETIA) 10 MG tablet Take 10 mg by mouth daily.     metoprolol succinate (TOPROL-XL) 25 MG 24 hr tablet Take 25 mg by mouth daily.     Multiple Vitamin (MULTI-VITAMIN) tablet Take 1 tablet by mouth daily.     pregabalin (LYRICA) 150 MG capsule Take 150mg  PO TID, may take additional tablet once per day if needed for pain. Max daily dose 600mg /day 120 capsule 2   traMADol (ULTRAM) 50 MG tablet TAKE TWO TABLETS BY MOUTH EVERY 12 HOURS AS NEEDED FOR PAIN. MAY TAKE ONE EXTRA TABLET FOR BREAKTHROUGH PAIN 150 tablet 2   triamcinolone ointment (KENALOG) 0.5 % Apply 1 application topically 2 (two) times daily. 15 g 0   Clotrimazole 1 % OINT Apply 1 application topically 2 (two) times daily. (Patient not taking: Reported on 02/09/2021) 56.7 g 1   predniSONE (STERAPRED UNI-PAK 21 TAB) 10 MG (21) TBPK tablet As directed (Patient  not taking: Reported on 02/09/2021) 21 tablet 0   No current facility-administered medications on file prior to visit.     Family Hx: The patient's family history includes Anxiety disorder in his mother; Arthritis in his father; Coronary artery disease (age of onset: 27) in his maternal grandfather; Diabetes in his father; Hearing loss in his father; Hyperlipidemia in his father.  ROS:   Please see the history of present illness.    Review of Systems  Constitutional: Negative.   HENT: Negative.    Respiratory: Negative.    Cardiovascular: Negative.   Gastrointestinal: Negative.   Musculoskeletal: Negative.   Neurological: Negative.   Psychiatric/Behavioral: Negative.    All other systems  reviewed and are negative.   Labs/Other Tests and Data Reviewed:    Recent Labs: 08/11/2020: ALT 12; BUN 16; Creatinine, Ser 0.85; Potassium 4.4; Sodium 137; TSH 2.00 12/15/2020: Hemoglobin 13.7; Platelets 273.0   Recent Lipid Panel Lab Results  Component Value Date/Time   CHOL 174 12/15/2020 11:16 AM   TRIG 124.0 12/15/2020 11:16 AM   HDL 33.10 (L) 12/15/2020 11:16 AM   CHOLHDL 5 12/15/2020 11:16 AM   LDLCALC 116 (H) 12/15/2020 11:16 AM   LDLDIRECT 149.0 08/11/2020 10:09 AM    Wt Readings from Last 3 Encounters:  02/09/21 161 lb (73 kg)  12/17/20 166 lb 6.4 oz (75.5 kg)  12/14/20 164 lb (74.4 kg)     Exam:    BP 120/72 (BP Location: Left Arm, Patient Position: Sitting, Cuff Size: Normal)    Pulse 69    Ht 6\' 1"  (1.854 m)    Wt 161 lb (73 kg)    SpO2 98%    BMI 21.24 kg/m  Constitutional:  oriented to person, place, and time. No distress.  HENT:  Head: Grossly normal Eyes:  no discharge. No scleral icterus.  Neck: No JVD, no carotid bruits  Cardiovascular: Regular rate and rhythm, no murmurs appreciated Pulmonary/Chest: Clear to auscultation bilaterally, no wheezes or rails Abdominal: Soft.  no distension.  no tenderness.  Musculoskeletal: Normal range of motion Neurological:  normal muscle tone. Coordination normal. No atrophy Skin: Skin warm and dry Psychiatric: normal affect, pleasant   ASSESSMENT & PLAN:    Problem List Items Addressed This Visit       Cardiology Problems   HLD (hyperlipidemia)   Coronary artery calcification     Other   Diabetes mellitus without complication (HCC) - Primary  Atypical chest pain Rib pain, also with back pain, no further work-up  Coronary calcification Calcium score of 3, minimally elevated No further work-up ordered  Hyperlipidemia Dramatic improvement with Zetia alone, no changes made Total cholesterol down 50-60 points CT scan reviewed, no plaque in his aorta  Smoking We have encouraged him to continue to work on  weaning his cigarettes and smoking cessation. He will continue to work on this and does not want any assistance with chantix.      Total encounter time more than 25 minutes  Greater than 50% was spent in counseling and coordination of care with the patient   Signed, , MD  [facility] [address], [place], [place]

## 2021-02-24 ENCOUNTER — Other Ambulatory Visit: Payer: Self-pay | Admitting: Family

## 2021-02-24 DIAGNOSIS — G8929 Other chronic pain: Secondary | ICD-10-CM

## 2021-03-19 ENCOUNTER — Ambulatory Visit: Payer: 59 | Admitting: Family

## 2021-03-19 ENCOUNTER — Other Ambulatory Visit: Payer: Self-pay

## 2021-03-19 ENCOUNTER — Encounter: Payer: Self-pay | Admitting: Family

## 2021-03-19 VITALS — BP 126/68 | HR 72 | Temp 97.9°F | Ht 72.0 in | Wt 160.0 lb

## 2021-03-19 DIAGNOSIS — G8929 Other chronic pain: Secondary | ICD-10-CM | POA: Diagnosis not present

## 2021-03-19 DIAGNOSIS — E119 Type 2 diabetes mellitus without complications: Secondary | ICD-10-CM | POA: Diagnosis not present

## 2021-03-19 DIAGNOSIS — R079 Chest pain, unspecified: Secondary | ICD-10-CM

## 2021-03-19 DIAGNOSIS — M545 Low back pain, unspecified: Secondary | ICD-10-CM | POA: Diagnosis not present

## 2021-03-19 MED ORDER — TIZANIDINE HCL 2 MG PO CAPS
2.0000 mg | ORAL_CAPSULE | Freq: Every evening | ORAL | 1 refills | Status: DC | PRN
Start: 2021-03-19 — End: 2021-10-20

## 2021-03-19 NOTE — Patient Instructions (Addendum)
Start tizandine, voltaren gel.  ?Continue heat ? ?Consider prednisone if right chest wall pain doesn't improve ? ?Please also  let me know if any new symptoms develop including fever, cough, rash. ?

## 2021-03-19 NOTE — Progress Notes (Signed)
? ?Subjective:  ? ? Patient ID: Philip Stephens, male    DOB: 1975-03-16, 46 y.o.   MRN: [id number] ? ?CC: Philip Stephens is a 46 y.o. male who presents today for follow up.  ? ?HPI: Complains of right lateral chest pain, x 7 days, waxed and waned. Worse with twisting and reaching right arm.  ?Pulling on machine at work with right arm with pain started.  He heard a 'crack'. No fall.  ?Pain is superficial when he presses on it. No pain with deep inspiration  ?No rash, cough, sob, congestion, legs swelling.  ? ?He is right handed.  ? ?He has been taking one BC powder , heating pad with some relief. He has flexeril but feels so sedated when takes.  ? ? ? ?Follow-up with cardiology 02/19/2021, Dr. Rockey Situ regarding atypical chest pain, coronary calcification, hyperlipidemia ? ?DM-he is eating healthier.  ? ?Chronic low back pain- Compliant Lyrica 150 mg 3 times daily, tramadol 100 mg BID and tramadol 50mg  as needed daily which is a good regimen for him.  He feels regimen is adequate. he is no longer on cymbalta.  ? ?HISTORY:  ?Past Medical History:  ?Diagnosis Date  ? Arthritis   ? Hypertension   ? ?No past surgical history on file. ?Family History  ?Problem Relation Age of Onset  ? Anxiety disorder Mother   ? Hyperlipidemia Father   ? Hearing loss Father   ? Diabetes Father   ? Arthritis Father   ? Coronary artery disease Maternal Grandfather 50  ? ? ?Allergies: Penicillins ?Current Outpatient Medications on File Prior to Visit  ?Medication Sig Dispense Refill  ? Aspirin-Salicylamide-Caffeine (ARTHRITIS STRENGTH BC POWDER PO) Take by mouth daily as needed.    ? Clotrimazole 1 % OINT Apply 1 application topically 2 (two) times daily. 56.7 g 1  ? esomeprazole (NEXIUM) 20 MG capsule Take 1 capsule (20 mg total) by mouth 2 (two) times daily before a meal. 120 capsule 1  ? ezetimibe (ZETIA) 10 MG tablet Take 10 mg by mouth daily.    ? metoprolol succinate (TOPROL-XL) 25 MG 24 hr tablet Take 25 mg by mouth daily.    ?  Multiple Vitamin (MULTI-VITAMIN) tablet Take 1 tablet by mouth daily.    ? pregabalin (LYRICA) 150 MG capsule TAKE ONE CAPSULE BY MOUTH THREE TIMES A DAY WITH 1 ADDITIONAL CAPSULE IF NEEDED FOR PAIN 120 capsule 2  ? traMADol (ULTRAM) 50 MG tablet TAKE TWO TABLETS BY MOUTH EVERY 12 HOURS AS NEEDED FOR PAIN. MAY TAKE ONE EXTRA TABLET FOR BREAKTHROUGH PAIN 150 tablet 2  ? Cholecalciferol 25 MCG (1000 UT) capsule Take by mouth. (Patient not taking: Reported on 03/19/2021)    ? ?No current facility-administered medications on file prior to visit.  ? ? ?Social History  ? ?Tobacco Use  ? Smoking status: Every Day  ?  Packs/day: 1.00  ?  Years: 28.00  ?  Pack years: 28.00  ?  Types: Cigarettes  ?  Last attempt to quit: 02/19/2018  ?  Years since quitting: 3.0  ? Smokeless tobacco: Former  ? Tobacco comments:  ?  Does Velo. ( has nicotine , no tobacco).   ?Substance Use Topics  ? Alcohol use: Not Currently  ? Drug use: Never  ? ? ?Review of Systems  ?Constitutional:  Negative for chills and fever.  ?Respiratory:  Negative for cough and shortness of breath.   ?Cardiovascular:  Positive for chest pain (right sided). Negative for palpitations and  leg swelling.  ?Gastrointestinal:  Negative for nausea and vomiting.  ?Musculoskeletal:  Positive for back pain (chronic, stable).  ?   ?Objective:  ?  ?BP 126/68 (BP Location: Left Arm, Patient Position: Sitting, Cuff Size: Normal)   Pulse 72   Temp 97.9 ?F (36.6 ?C) (Oral)   Ht 6' (1.829 m)   Wt 160 lb (72.6 kg)   SpO2 97%   BMI 21.70 kg/m?  ?BP Readings from Last 3 Encounters:  ?03/19/21 126/68  ?02/09/21 120/72  ?12/17/20 128/84  ? ?Wt Readings from Last 3 Encounters:  ?03/19/21 160 lb (72.6 kg)  ?02/09/21 161 lb (73 kg)  ?12/17/20 166 lb 6.4 oz (75.5 kg)  ? ? ?Physical Exam ?Vitals reviewed.  ?Constitutional:   ?   Appearance: He is well-developed.  ?Cardiovascular:  ?   Rate and Rhythm: Regular rhythm.  ?   Heart sounds: Normal heart sounds.  ?Pulmonary:  ?   Effort:  Pulmonary effort is normal. No respiratory distress.  ?   Breath sounds: Normal breath sounds. No wheezing, rhonchi or rales.  ?Chest:  ?   Chest wall: Tenderness present. No mass, swelling, crepitus or edema.  ? ? ?   Comments: Area of tenderness when palpated on exam.  No gross deformity, bony step-off, rash, erythema or increased heat.  Patient able to take full inspiration without any pain. ?Musculoskeletal:  ?   Right shoulder: No swelling or bony tenderness. Normal range of motion.  ?   Left shoulder: No swelling or bony tenderness. Normal range of motion.  ?   Comments: Right Shoulder:  ? ?No asymmetry of shoulders when comparing right and left.No pain with palpation over glenohumeral joint lines, Dousman joint, AC joint, or bicipital groove. No pain with internal and external rotation. Mild pain with resisted lateral extension .  ?  ?Skin: ?   General: Skin is warm and dry.  ?Neurological:  ?   Mental Status: He is alert.  ?Psychiatric:     ?   Speech: Speech normal.     ?   Behavior: Behavior normal.  ? ? ?   ?Assessment & Plan:  ? ?Problem List Items Addressed This Visit   ? ?  ? Endocrine  ? Diabetes mellitus without complication ([place])  ?  Diet controlled.  Pending A1c.  He politely declines any labs today including CBC w/ Diff.  ?  ?  ? Relevant Orders  ? Hemoglobin A1c  ?  ? Other  ? Chronic midline low back pain without sciatica  ?  Chronic, stable. Continue Lyrica 150 mg 3 times daily, tramadol 100 mg BID and tramadol 50mg  as needed daily for breakthrough. ?  ?  ? Relevant Medications  ? tizanidine (ZANAFLEX) 2 MG capsule  ? Right-sided chest pain - Primary  ?  Presentation consistent with soft tissue injury,  muscular strain from reaching at work.  Producible chest wall tenderness exam.we discussed conservative measures including continued heat, Voltaren gel. He has tried ibuprofen without relief ;Tylenol Arthritis has previously not been effective.  ? ? I have sent in tizanidine in hopes it will be less  sedating muscle relaxant for him.  In the past when taking prednisone he had trouble sleeping so we deferred prednisone at this time, however we may consider if symptoms do not resolve.  He will let me know how he is doing ?  ?  ? Relevant Medications  ? tizanidine (ZANAFLEX) 2 MG capsule  ? Other Relevant Orders  ?  CBC with Differential/Platelet  ? ? ? ?I have discontinued Yassir C. Bisig's triamcinolone ointment and predniSONE. I am also having him start on tizanidine. Additionally, I am having him maintain his Multi-Vitamin, Cholecalciferol, esomeprazole, metoprolol succinate, ezetimibe, Clotrimazole, traMADol, Aspirin-Salicylamide-Caffeine (ARTHRITIS STRENGTH BC POWDER PO), and pregabalin. ? ? ?Meds ordered this encounter  ?Medications  ? tizanidine (ZANAFLEX) 2 MG capsule  ?  Sig: Take 1 capsule (2 mg total) by mouth at bedtime as needed for muscle spasms.  ?  Dispense:  30 capsule  ?  Refill:  1  ?  Order Specific Question:   Supervising Provider  ?  Answer:   Crecencio Mc [[id number]]  ? ? ?Return precautions given.  ? ?Risks, benefits, and alternatives of the medications and treatment plan prescribed today were discussed, and patient expressed understanding.  ? ?Education regarding symptom management and diagnosis given to patient on AVS. ? ?Continue to follow with Burnard Hawthorne, FNP for routine health maintenance.  ? ?[name] and I agreed with plan.  ? ?Mable Paris, FNP ? ? ?

## 2021-03-19 NOTE — Assessment & Plan Note (Signed)
Chronic, stable. Continue Lyrica 150 mg 3 times daily, tramadol 100 mg BID and tramadol 50mg  as needed daily for breakthrough. ?

## 2021-03-19 NOTE — Assessment & Plan Note (Signed)
Presentation consistent with soft tissue injury,  muscular strain from reaching at work.  Producible chest wall tenderness exam.we discussed conservative measures including continued heat, Voltaren gel. He has tried ibuprofen without relief ;Tylenol Arthritis has previously not been effective.  ? ? I have sent in tizanidine in hopes it will be less sedating muscle relaxant for him.  In the past when taking prednisone he had trouble sleeping so we deferred prednisone at this time, however we may consider if symptoms do not resolve.  He will let me know how he is doing ?

## 2021-03-19 NOTE — Assessment & Plan Note (Signed)
Diet controlled.  Pending A1c.  He politely declines any labs today including CBC w/ Diff.  ?

## 2021-03-22 ENCOUNTER — Other Ambulatory Visit: Payer: Self-pay | Admitting: Cardiovascular Disease

## 2021-03-25 NOTE — Addendum Note (Signed)
Addended by: Warden Fillers on: 03/25/2021 03:26 PM ? ? Modules accepted: Orders ? ?

## 2021-03-26 ENCOUNTER — Other Ambulatory Visit: Payer: 59

## 2021-03-26 NOTE — Addendum Note (Signed)
Addended by: Warden Fillers on: 03/26/2021 02:27 PM ? ? Modules accepted: Orders ? ?

## 2021-04-23 ENCOUNTER — Other Ambulatory Visit: Payer: Self-pay

## 2021-04-23 ENCOUNTER — Encounter: Payer: Self-pay | Admitting: Family

## 2021-04-23 DIAGNOSIS — G8929 Other chronic pain: Secondary | ICD-10-CM

## 2021-04-23 MED ORDER — TRAMADOL HCL 50 MG PO TABS: ORAL_TABLET | ORAL | 0 refills | Status: DC

## 2021-04-25 ENCOUNTER — Other Ambulatory Visit: Payer: Self-pay | Admitting: Family

## 2021-04-25 DIAGNOSIS — M545 Low back pain, unspecified: Secondary | ICD-10-CM

## 2021-05-18 NOTE — Telephone Encounter (Signed)
Called patient to schedule him for lab appointment and  in the process the phone got disconnected. Tried to call patient back but did not get an answer! ?

## 2021-05-19 ENCOUNTER — Other Ambulatory Visit: Payer: Self-pay | Admitting: Family

## 2021-05-19 DIAGNOSIS — M545 Low back pain, unspecified: Secondary | ICD-10-CM

## 2021-05-19 MED ORDER — TRAMADOL HCL 50 MG PO TABS: ORAL_TABLET | ORAL | 2 refills | Status: DC

## 2021-05-20 ENCOUNTER — Other Ambulatory Visit: Payer: Self-pay | Admitting: Cardiovascular Disease

## 2021-05-20 NOTE — Telephone Encounter (Signed)
Spoke to patient and informed him that his prescription has been filed and scheduled him an appointment for next month

## 2021-05-21 ENCOUNTER — Ambulatory Visit
Admission: RE | Admit: 2021-05-21 | Discharge: 2021-05-21 | Disposition: A | Discharge status: Home / Self Care | Payer: 59 | Source: Ambulatory Visit | Attending: Internal Medicine | Admitting: Internal Medicine

## 2021-05-21 ENCOUNTER — Ambulatory Visit (INDEPENDENT_AMBULATORY_CARE_PROVIDER_SITE_OTHER): Payer: 59 | Admitting: Internal Medicine

## 2021-05-21 ENCOUNTER — Encounter: Payer: Self-pay | Admitting: Internal Medicine

## 2021-05-21 VITALS — BP 124/80 | HR 80 | Temp 98.0°F | Resp 14 | Ht 72.0 in | Wt 149.8 lb

## 2021-05-21 DIAGNOSIS — M79661 Pain in right lower leg: Secondary | ICD-10-CM | POA: Diagnosis not present

## 2021-05-21 DIAGNOSIS — M79604 Pain in right leg: Secondary | ICD-10-CM

## 2021-05-21 DIAGNOSIS — M7989 Other specified soft tissue disorders: Secondary | ICD-10-CM | POA: Insufficient documentation

## 2021-05-21 DIAGNOSIS — H547 Unspecified visual loss: Secondary | ICD-10-CM

## 2021-05-21 NOTE — Patient Instructions (Addendum)
Voltaren gel 4x per day  Or  Asprecream with lidocaine   Heat or ice    [facility]  Phone Fax E-mail Address  [phone] Not available [address]    [place] [address]     Specialties       Tinnitus Tinnitus refers to hearing a sound when there is no actual source for that sound. This is often described as ringing in the ears. However, people with this condition may hear a variety of noises, in one ear or in both ears. The sounds of tinnitus can be soft, loud, or somewhere in between. Tinnitus can last for a few seconds or can be constant for days. It may go away without treatment and come back at various times. When tinnitus is constant or happens often, it can lead to other problems, such as trouble sleeping and trouble concentrating. Almost everyone experiences tinnitus at some point. Tinnitus is not the same as hearing loss. Tinnitus that is long-lasting (chronic) or comes back often (recurs) may require medical attention. What are the causes? The cause of tinnitus is often not known. In some cases, it can result from: Exposure to loud noises from machinery, music, or other sources. An object (foreign body) stuck in the ear. Earwax buildup. Drinking alcohol or caffeine. Taking certain medicines. Age-related hearing loss. It may also be caused by medical conditions such as: Ear or sinus infections. Heart diseases or high blood pressure. Allergies. Mnire's disease. Thyroid problems. Tumors. A weak, bulging blood vessel (aneurysm) near the ear. What increases the risk? The following factors may make you more likely to develop this condition: Exposure to loud noises. Age. Tinnitus is more likely in older individuals. Using alcohol or tobacco. What are the signs or symptoms? The main symptom of tinnitus is hearing a sound when there is no source for that sound. It may sound like: Buzzing. Sizzling. Ringing. Blowing  air. Hissing. Whistling. Other sounds may include: Roaring. Running water. A musical note. Tapping. Humming. Symptoms may affect only one ear (unilateral) or both ears (bilateral). How is this diagnosed? Tinnitus is diagnosed based on your symptoms, your medical history, and a physical exam. Your health care provider may do a thorough hearing test (audiologic exam) if your tinnitus: Is unilateral. Causes hearing difficulties. Lasts 6 months or longer. You may work with a health care provider who specializes in hearing disorders (audiologist). You may be asked questions about your symptoms and how they affect your daily life. You may have other tests done, such as: CT scan. MRI. An imaging test of how blood flows through your blood vessels (angiogram). How is this treated? Treating an underlying medical condition can sometimes make tinnitus go away. If your tinnitus continues, other treatments may include: Therapy and counseling to help you manage the stress of living with tinnitus. Sound generators to mask the tinnitus. These include: Tabletop sound machines that play relaxing sounds to help you fall asleep. Wearable devices that fit in your ear and play sounds or music. Acoustic neural stimulation. This involves using headphones to listen to music that contains an auditory signal. Over time, listening to this signal may change some pathways in your brain and make you less sensitive to tinnitus. This treatment is used for very severe cases when no other treatment is working. Using hearing aids or cochlear implants if your tinnitus is related to hearing loss. Hearing aids are worn in the outer ear. Cochlear implants are surgically placed in the inner ear. Follow these  instructions at home: Managing symptoms     When possible, avoid being in loud places and being exposed to loud sounds. Wear hearing protection, such as earplugs, when you are exposed to loud noises. Use a white noise  machine, a humidifier, or other devices to mask the sound of tinnitus. Practice techniques for reducing stress, such as meditation, yoga, or deep breathing. Work with your health care provider if you need help with managing stress. Sleep with your head slightly raised. This may reduce the impact of tinnitus. General instructions Do not use stimulants, such as nicotine, alcohol, or caffeine. Talk with your health care provider about other stimulants to avoid. Stimulants are substances that can make you feel alert and attentive by increasing certain activities in the body (such as heart rate and blood pressure). These substances may make tinnitus worse. Take over-the-counter and prescription medicines only as told by your health care provider. Try to get plenty of sleep each night. Keep all follow-up visits. This is important. Contact a health care provider if: Your tinnitus continues for 3 weeks or longer without stopping. You develop sudden hearing loss. Your symptoms get worse or do not get better with home care. You feel you are not able to manage the stress of living with tinnitus. Get help right away if: You develop tinnitus after a head injury. You have tinnitus along with any of the following: Dizziness. Nausea and vomiting. Loss of balance. Sudden, severe headache. Vision changes. Facial weakness or weakness of arms or legs. These symptoms may represent a serious problem that is an emergency. Do not wait to see if the symptoms will go away. Get medical help right away. Call your local emergency services (911 in the [place] Do not drive yourself to the hospital. Summary Tinnitus refers to hearing a sound when there is no actual source for that sound. This is often described as ringing in the ears. Symptoms may affect only one ear (unilateral) or both ears (bilateral). Use a white noise machine, a humidifier, or other devices to mask the sound of tinnitus. Do not use stimulants, such  as nicotine, alcohol, or caffeine. These substances may make tinnitus worse. This information is not intended to replace advice given to you by your health care provider. Make sure you discuss any questions you have with your health care provider. Document Revised: 11/25/2019 Document Reviewed: 11/25/2019 Elsevier Patient Education  2023 [employer] Inc[employer]  Thrombophlebitis  Thrombophlebitis is a condition in which a blood clot and inflammation occur inside a vein. This can happen in the arms, legs, or torso. Thrombophlebitis may involve superficial veins, deep veins, or both. Superficial veins are close to the surface of the body and are part of the superficial venous system. Veins that are deeper inside the body are part of the deep venous system. When this condition happens in a superficial vein (superficial thrombophlebitis), it is usually not serious.However, when the condition happens in a vein that is part of the deep venous system (deep vein thrombosis, DVT), it can cause serious problems. What are the causes? This condition may be caused by: Infection, injury, or trauma to a vein. Inflammation of the veins. Medical conditions that can cause blood to clot more easily (hypercoagulable state). Backing up, or reflux, of blood flow through the veins (chronic venous insufficiency or venous stasis). What increases the risk? The following factors may make you more likely to develop this condition: Having a long, thin tube (catheter) put in a vein, such as a central  line, port, or IV catheter. Getting certain medicines through a catheter that can irritate the vein. Pregnancy or having recently given birth. Cancer. Obesity. Taking oral contraceptive pills (OCPs) or hormone therapy (HT) medicines. Spasms of veins. Immobilization, or not moving the limbs for prolonged periods. What are the signs or symptoms? The main symptoms of this condition are: Swelling and pain in an arm or leg. If the  affected vein is in the leg, you may feel pain while standing or walking. Warmth or redness in an arm or leg. Tenderness in the affected area when it is touched. Other symptoms include: Low-grade fever. Muscle aches. A bulging or hard vein (venous distension). In some cases, there are no symptoms. How is this diagnosed? This condition may be diagnosed based on: Your symptoms and medical history. A physical exam. Tests, such as a test that uses sound waves to make images (duplex ultrasound). How is this treated? Treatment depends on how severe the condition is and which area of the body is affected. Treatment may include: Applying a warm compress or heating pad to affected areas. This may need to be repeated several times a day. Moving the affected limb. For example, you may need to start doing walking exercises right away. You will also be encouraged to continue your usual daily activities. Raising (elevating) the affected arm or leg above the level of your heart. Medicines, such as: Anti-inflammatory medicines, such as ibuprofen. Blood thinners (anticoagulants) or anti-platelet drugs such as aspirin. Removing an IV or central line that may be causing the problem. Wearing compression stockings to help prevent blood clots and reduce swelling in your legs. Follow these instructions at home: Medicines Take over-the-counter and prescription medicines only as told by your health care provider. If you are taking blood thinners: Talk with your health care provider before you take any medicines that contain aspirin or NSAIDs, such as ibuprofen. These medicines increase your risk for dangerous bleeding. Take your medicine exactly as told, at the same time every day. Avoid activities that could cause injury or bruising, and follow instructions about how to prevent falls. Wear a medical alert bracelet or carry a card that lists what medicines you take. Managing pain, stiffness, and swelling  If  directed, apply heat to the affected area as often as told by your health care provider. Use the heat source that your health care provider recommends, such as a moist heat pack or a heating pad. Place a towel between your skin and the heat source. Leave the heat on for 20-30 minutes. Remove the heat if your skin turns bright red. This is especially important if you are unable to feel pain, heat, or cold. You have a greater risk of getting burned. Elevate the affected area above the level of your heart while you are sitting or lying down. Activity Return to your normal activities as told by your health care provider. Ask your health care provider what activities are safe for you. Avoid sitting for a long time without moving. Get up to take short walks every 1-2 hours. This is important to improve blood flow and breathing. Ask for help if you feel weak or unsteady. Do exercises as told by your health care provider. Rest as told by your health care provider. General instructions Drink enough fluid to keep your urine pale yellow. Wear compression stockings as told by your health care provider. Do not use any products that contain nicotine or tobacco. These products include cigarettes, chewing tobacco, and vaping  devices, such as e-cigarettes. If you need help quitting, ask your health care provider. Keep all follow-up visits. This is important. Contact a health care provider if: You miss a dose of your blood thinner, if applicable. Your symptoms do not improve. You have unusual bruising. You have nausea, vomiting, or diarrhea that lasts for more than a day. Get help right away if: You are breathing fast or have chest pain. You have blood in your vomit, urine, or stool. You have severe pain in your affected arm or leg or new pain in any arm or leg. You have light-headedness, dizziness, a severe headache, or confusion. These symptoms may represent a serious problem that is an emergency. Do not  wait to see if the symptoms will go away. Get medical help right away. Call your local emergency services (911 in the [place] Do not drive yourself to the hospital. Summary Thrombophlebitis is a condition in which a blood clot forms in a vein, causing inflammation and often pain. This can happen in both superficial and deep veins. The main symptom of this condition is swelling and pain around the affected vein. Tenderness and redness may also be present. Treatment may include warm compresses, compression stockings, anti-inflammatory medicines, or blood thinners. Make sure you take all medicines, especially blood thinners, as instructed and keep all follow-up visits to ensure proper healing of the vein. This information is not intended to replace advice given to you by your health care provider. Make sure you discuss any questions you have with your health care provider. Document Revised: 06/15/2020 Document Reviewed: 06/15/2020 Elsevier Patient Education  2023 [employer].

## 2021-05-21 NOTE — Progress Notes (Signed)
Chief Complaint  Patient presents with   Leg Pain    R calf swollen since Monday. Hurt back 3 wks ago has been off of work since. MRI sch for Tuesday. Has taken 2 rounds of prednisone but stopped due to him reading that it can cause calf pain. Concerned about poss DVT   F/u  1. Right lower leg pain and swelling since Monday hurt back 3-5 weeks ago at work and 2 rounds prednisone stopped yesterday MRI sch low back 05/25/21  Right lower leg is painful  Mechanism of back injury leaned over and heard a pop in his back at work while reaching in a basket  This has happened before with right lower leg pain 07/2020 [place] was neg  He has 16-18 hr drive tomorrow and wants to make sure ok    Review of Systems  Eyes:        +reduced vision  Cardiovascular:  Positive for leg swelling.  Past Medical History:  Diagnosis Date   Arthritis    Hypertension    No past surgical history on file. Family History  Problem Relation Age of Onset   Anxiety disorder Mother    Hyperlipidemia Father    Hearing loss Father    Diabetes Father    Arthritis Father    Coronary artery disease Maternal Grandfather 87   Social History   Socioeconomic History   Marital status: Married    Spouse name: Not on file   Number of children: Not on file   Years of education: Not on file   Highest education level: Not on file  Occupational History   Not on file  Tobacco Use   Smoking status: Every Day    Packs/day: 1.00    Years: 28.00    Pack years: 28.00    Types: Cigarettes    Last attempt to quit: 02/19/2018    Years since quitting: 3.2   Smokeless tobacco: Former   Tobacco comments:    Does Velo. ( has nicotine , no tobacco).   Vaping Use   Vaping Use: Not on file  Substance and Sexual Activity   Alcohol use: Not Currently   Drug use: Never   Sexual activity: Yes  Other Topics Concern   Not on file  Social History Narrative   Works at [employer].    2 boys- 7 and 15   1 daughter 35   Social Determinants  of Corporate investment banker Strain: Not on file  Food Insecurity: Not on file  Transportation Needs: Not on file  Physical Activity: Not on file  Stress: Not on file  Social Connections: Not on file  Intimate Partner Violence: Not on file   Current Meds  Medication Sig   Aspirin-Salicylamide-Caffeine (ARTHRITIS STRENGTH BC POWDER PO) Take by mouth daily as needed.   Cholecalciferol 25 MCG (1000 UT) capsule Take by mouth.   esomeprazole (NEXIUM) 20 MG capsule Take 1 capsule (20 mg total) by mouth 2 (two) times daily before a meal.   ezetimibe (ZETIA) 10 MG tablet TAKE ONE TABLET BY MOUTH DAILY   metoprolol succinate (TOPROL-XL) 25 MG 24 hr tablet TAKE ONE TABLET BY MOUTH DAILY   Multiple Vitamin (MULTI-VITAMIN) tablet Take 1 tablet by mouth daily.   pregabalin (LYRICA) 150 MG capsule TAKE ONE CAPSULE BY MOUTH THREE TIMES A DAY WITH 1 ADDITIONAL CAPSULE IF NEEDED FOR PAIN   traMADol (ULTRAM) 50 MG tablet TAKE TWO TABLETS BY MOUTH EVERY 12 HOURS AS NEEDED FOR PAIN.  MAY TAKE ONE EXTRA TABLET FOR BREAKTHROUGH PAIN   Allergies  Allergen Reactions   Penicillins Swelling   No results found for this or any previous visit (from the past 2160 hour(s)). Objective  Body mass index is 20.32 kg/m. Wt Readings from Last 3 Encounters:  05/21/21 149 lb 12.8 oz (67.9 kg)  03/19/21 160 lb (72.6 kg)  02/09/21 161 lb (73 kg)   Temp Readings from Last 3 Encounters:  05/21/21 98 F (36.7 C) (Oral)  03/19/21 97.9 F (36.6 C) (Oral)  12/17/20 (!) 96.6 F (35.9 C) (Skin)   BP Readings from Last 3 Encounters:  05/21/21 124/80  03/19/21 126/68  02/09/21 120/72   Pulse Readings from Last 3 Encounters:  05/21/21 80  03/19/21 72  02/09/21 69    Physical Exam Vitals and nursing note reviewed.  Constitutional:      Appearance: Normal appearance. He is well-developed and well-groomed.  HENT:     Head: Normocephalic and atraumatic.  Eyes:     Conjunctiva/sclera: Conjunctivae normal.      Pupils: Pupils are equal, round, and reactive to light.  Cardiovascular:     Rate and Rhythm: Normal rate and regular rhythm.     Heart sounds: Normal heart sounds.  Pulmonary:     Effort: Pulmonary effort is normal. No respiratory distress.     Breath sounds: Normal breath sounds.  Abdominal:     Tenderness: There is no abdominal tenderness.  Skin:    General: Skin is warm and moist.  Neurological:     General: No focal deficit present.     Mental Status: He is alert and oriented to person, place, and time. Mental status is at baseline.     Sensory: Sensation is intact.     Motor: Motor function is intact.     Coordination: Coordination is intact.     Gait: Gait is intact. Gait normal.  Psychiatric:        Attention and Perception: Attention and perception normal.        Mood and Affect: Mood and affect normal.        Speech: Speech normal.        Behavior: Behavior normal. Behavior is cooperative.        Thought Content: Thought content normal.        Cognition and Memory: Cognition and memory normal.        Judgment: Judgment normal.    Assessment  Plan  Right leg pain - Plan: US Venous Img Lower Unilateral Right Pain and swelling of right lower leg - Plan: US Venous Img Lower Unilateral Right Voltaren gel 4x per day  Or  Asprecream with lidocaine  Heat or ice   Reduced vision - Plan: Ambulatory referral to [facility]   F/u pcp as sch Provider: Dr. French Ana McLean-[facility]

## 2021-05-27 ENCOUNTER — Other Ambulatory Visit: Payer: Self-pay | Admitting: Family

## 2021-05-27 ENCOUNTER — Other Ambulatory Visit: Payer: Self-pay | Admitting: Internal Medicine

## 2021-05-27 ENCOUNTER — Encounter: Payer: Self-pay | Admitting: Family

## 2021-05-27 DIAGNOSIS — G8929 Other chronic pain: Secondary | ICD-10-CM

## 2021-05-27 MED ORDER — PREGABALIN 150 MG PO CAPS: ORAL_CAPSULE | ORAL | 0 refills | Status: DC

## 2021-05-27 NOTE — Telephone Encounter (Signed)
Refilled: 02/24/2021 Last OV: 05/21/2021 Next OV: 06/16/2021

## 2021-06-01 ENCOUNTER — Encounter: Payer: Self-pay | Admitting: Family

## 2021-06-01 ENCOUNTER — Other Ambulatory Visit: Payer: Self-pay

## 2021-06-01 MED ORDER — ESOMEPRAZOLE MAGNESIUM 20 MG PO CPDR: 20.0000 mg | DELAYED_RELEASE_CAPSULE | Freq: Two times a day (BID) | ORAL | 1 refills | Status: DC

## 2021-06-04 ENCOUNTER — Other Ambulatory Visit: Payer: Self-pay

## 2021-06-04 MED ORDER — ESOMEPRAZOLE MAGNESIUM 20 MG PO CPDR: 20.0000 mg | DELAYED_RELEASE_CAPSULE | Freq: Two times a day (BID) | ORAL | 1 refills | Status: DC

## 2021-06-10 ENCOUNTER — Encounter: Payer: Self-pay | Admitting: Family

## 2021-06-10 ENCOUNTER — Ambulatory Visit: Payer: 59 | Admitting: Family

## 2021-06-10 DIAGNOSIS — G8929 Other chronic pain: Secondary | ICD-10-CM

## 2021-06-10 DIAGNOSIS — M545 Low back pain, unspecified: Secondary | ICD-10-CM | POA: Diagnosis not present

## 2021-06-10 NOTE — Progress Notes (Signed)
Subjective:    Patient ID: Philip Stephens, male    DOB: 1975-10-18, 46 y.o.   MRN: [id number]  CC: [name] is a 46 y.o. male who presents today for follow up.   HPI: Patient complains of low back sprain over the past 8 weeks. Pain has improved.  No lower extremity numbness.  He hurt his back while working at [employer].  He has been following with [facility] and PT in [place] which has been helpful.  He was declined from [employer] from his work.  He been doing normal activities  He is  here today to get a note for return to work on Monday 06/14/21.    Chronic low back pain-  compliant with Lyrica 150 mg 3 times daily, tramadol 100 mg BID and tramadol 50mg  as needed daily for breakthrough. He takes flexeril 5mg  prn qhs. He will use zanaflex 2mg  prn during day.    HISTORY:  Past Medical History:  Diagnosis Date   Arthritis    Hypertension    No past surgical history on file. Family History  Problem Relation Age of Onset   Anxiety disorder Mother    Hyperlipidemia Father    Hearing loss Father    Diabetes Father    Arthritis Father    Coronary artery disease Maternal Grandfather 59    Allergies: Penicillins Current Outpatient Medications on File Prior to Visit  Medication Sig Dispense Refill   Aspirin-Salicylamide-Caffeine (ARTHRITIS STRENGTH BC POWDER PO) Take by mouth daily as needed.     Cholecalciferol 25 MCG (1000 UT) capsule Take by mouth.     esomeprazole (NEXIUM) 20 MG capsule Take 1 capsule (20 mg total) by mouth 2 (two) times daily before a meal. 120 capsule 1   ezetimibe (ZETIA) 10 MG tablet TAKE ONE TABLET BY MOUTH DAILY 30 tablet 6   metoprolol succinate (TOPROL-XL) 25 MG 24 hr tablet TAKE ONE TABLET BY MOUTH DAILY 30 tablet 2   Multiple Vitamin (MULTI-VITAMIN) tablet Take 1 tablet by mouth daily.     pregabalin (LYRICA) 150 MG capsule TAKE ONE CAPSULE BY MOUTH THREE TIMES A DAY WITH ONE ADDITIONAL CAPSULE IF NEEDED FOR PAIN 120  capsule 2   traMADol (ULTRAM) 50 MG tablet TAKE TWO TABLETS BY MOUTH EVERY 12 HOURS AS NEEDED FOR PAIN. MAY TAKE ONE EXTRA TABLET FOR BREAKTHROUGH PAIN 150 tablet 2   Clotrimazole 1 % OINT Apply 1 application topically 2 (two) times daily. (Patient not taking: Reported on 05/21/2021) 56.7 g 1   tizanidine (ZANAFLEX) 2 MG capsule Take 1 capsule (2 mg total) by mouth at bedtime as needed for muscle spasms. (Patient not taking: Reported on 05/21/2021) 30 capsule 1   No current facility-administered medications on file prior to visit.    Social History   Tobacco Use   Smoking status: Every Day    Packs/day: 1.00    Years: 28.00    Total pack years: 28.00    Types: Cigarettes    Last attempt to quit: 02/19/2018    Years since quitting: 3.3   Smokeless tobacco: Former   Tobacco comments:    Does Velo. ( has nicotine , no tobacco).   Substance Use Topics   Alcohol use: Not Currently   Drug use: Never    Review of Systems  Constitutional:  Negative for chills and fever.  Respiratory:  Negative for cough.   Cardiovascular:  Negative for chest pain and palpitations.  Gastrointestinal:  Negative for nausea and  vomiting.  Musculoskeletal:  Positive for back pain.  Neurological:  Negative for numbness.      Objective:    BP 118/78 (BP Location: Left Arm, Patient Position: Sitting, Cuff Size: Normal)   Pulse 87   Temp (!) 97.5 F (36.4 C) (Oral)   Ht 6\' 1"  (1.854 m)   Wt 157 lb (71.2 kg)   SpO2 98%   BMI 20.71 kg/m  BP Readings from Last 3 Encounters:  06/10/21 118/78  05/21/21 124/80  03/19/21 126/68   Wt Readings from Last 3 Encounters:  06/10/21 157 lb (71.2 kg)  05/21/21 149 lb 12.8 oz (67.9 kg)  03/19/21 160 lb (72.6 kg)    Physical Exam Vitals reviewed.  Constitutional:      Appearance: He is well-developed.  Cardiovascular:     Rate and Rhythm: Regular rhythm.     Heart sounds: Normal heart sounds.  Pulmonary:     Effort: Pulmonary effort is normal. No  respiratory distress.     Breath sounds: Normal breath sounds. No wheezing, rhonchi or rales.  Musculoskeletal:     Lumbar back: No swelling, spasms or tenderness. Normal range of motion. Negative right straight leg raise test and negative left straight leg raise test.     Comments: Full range of motion with flexion, extension, lateral side bends and forward flexion. No pain, numbness, tingling elicited with single leg raise bilaterally. No rash.  Lymphadenopathy:     Head:     Left side of head: No submandibular or preauricular adenopathy.  Skin:    General: Skin is warm and dry.  Neurological:     Mental Status: He is alert.  Psychiatric:        Speech: Speech normal.        Behavior: Behavior normal.        Assessment & Plan:   Problem List Items Addressed This Visit       Other   Chronic midline low back pain without sciatica    Acute on chronic after work related injury.  Back pain has returned to baseline and patient requests to return to work, provided.  Continue Lyrica 150 mg 3 times daily, tramadol 100 mg BID and tramadol 50mg  as needed daily for breakthrough, flexeril 5mg  prn qhs. Continue zanaflex 2mg  prn during day.         I am having Izyan C. Escorcia maintain his Multi-Vitamin, Cholecalciferol, Clotrimazole, Aspirin-Salicylamide-Caffeine (ARTHRITIS STRENGTH BC POWDER PO), tizanidine, metoprolol succinate, traMADol, ezetimibe, pregabalin, and esomeprazole.   No orders of the defined types were placed in this encounter.   Return precautions given.   Risks, benefits, and alternatives of the medications and treatment plan prescribed today were discussed, and patient expressed understanding.   Education regarding symptom management and diagnosis given to patient on AVS.  Continue to follow with 03/21/21, FNP for routine health maintenance.   and I agreed with plan.   , FNP

## 2021-06-10 NOTE — Assessment & Plan Note (Signed)
Acute on chronic after work related injury.  Back pain has returned to baseline and patient requests to return to work, provided.  Continue Lyrica 150 mg 3 times daily, tramadol 100 mg BID and tramadol 50mg  as needed daily for breakthrough, flexeril 5mg  prn qhs. Continue zanaflex 2mg  prn during day.

## 2021-06-10 NOTE — Telephone Encounter (Signed)
I called the patient and he was seen today by the provider.  Philip Stephens,cma

## 2021-06-16 ENCOUNTER — Encounter: Payer: Self-pay | Admitting: Family

## 2021-06-16 ENCOUNTER — Ambulatory Visit (INDEPENDENT_AMBULATORY_CARE_PROVIDER_SITE_OTHER): Payer: 59 | Admitting: Family

## 2021-06-16 VITALS — BP 102/74 | HR 83 | Temp 97.8°F | Ht 73.0 in | Wt 158.0 lb

## 2021-06-16 DIAGNOSIS — E782 Mixed hyperlipidemia: Secondary | ICD-10-CM

## 2021-06-16 DIAGNOSIS — E119 Type 2 diabetes mellitus without complications: Secondary | ICD-10-CM

## 2021-06-16 DIAGNOSIS — M545 Low back pain, unspecified: Secondary | ICD-10-CM

## 2021-06-16 DIAGNOSIS — G8929 Other chronic pain: Secondary | ICD-10-CM

## 2021-06-16 DIAGNOSIS — R079 Chest pain, unspecified: Secondary | ICD-10-CM

## 2021-06-16 LAB — LIPID PANEL
Cholesterol: 201 mg/dL — ABNORMAL HIGH (ref 0–200)
HDL: 37.7 mg/dL — ABNORMAL LOW (ref >39.00)
LDL Cholesterol: 134 mg/dL — ABNORMAL HIGH (ref 0–99)
NonHDL: 162.89
Total CHOL/HDL Ratio: 5
Triglycerides: 146 mg/dL (ref 0.0–149.0)
VLDL: 29.2 mg/dL (ref 0.0–40.0)

## 2021-06-16 NOTE — Assessment & Plan Note (Signed)
Chronic, stable.  Continue Lyrica 150 mg 3 times daily, tramadol 100 mg BID and tramadol 50mg  as needed daily for breakthrough, tizanidine 2mg  prn. He is also taking ibuprofen 800mg  QD and counseled him on risk of GERD, PUD particularly when taking daily. He declines second opinion, evaluation for alternative intervention with Dr .

## 2021-06-16 NOTE — Progress Notes (Signed)
Subjective:    Patient ID: KY CLUM, male    DOB: 1975-11-15, 46 y.o.   MRN: [id number]  CC: Philip Stephens is a 46 y.o. male who presents today for follow up.   HPI: Feels well today No new complaints  He is back at work at [employer] by son today  HLD- compliant with zetia 10mg  Low back pain at baseline. He is compliant with Lyrica 150 mg 3 times daily, tramadol 100 mg BID and tramadol 50mg  as needed daily for breakthrough, tizanidine 2mg  prn. he is also taking ibuprofen 800mg  QD. No epigastric pain. Compliant with nexium HISTORY:  Past Medical History:  Diagnosis Date   Arthritis    Hypertension    History reviewed. No pertinent surgical history. Family History  Problem Relation Age of Onset   Anxiety disorder Mother    Hyperlipidemia Father    Hearing loss Father    Diabetes Father    Arthritis Father    Coronary artery disease Maternal Grandfather 1    Allergies: Penicillins Current Outpatient Medications on File Prior to Visit  Medication Sig Dispense Refill   Aspirin-Salicylamide-Caffeine (ARTHRITIS STRENGTH BC POWDER PO) Take by mouth daily as needed.     Cholecalciferol 25 MCG (1000 UT) capsule Take by mouth.     Clotrimazole 1 % OINT Apply 1 application topically 2 (two) times daily. 56.7 g 1   esomeprazole (NEXIUM) 20 MG capsule Take 1 capsule (20 mg total) by mouth 2 (two) times daily before a meal. 120 capsule 1   ezetimibe (ZETIA) 10 MG tablet TAKE ONE TABLET BY MOUTH DAILY 30 tablet 6   metoprolol succinate (TOPROL-XL) 25 MG 24 hr tablet TAKE ONE TABLET BY MOUTH DAILY 30 tablet 2   Multiple Vitamin (MULTI-VITAMIN) tablet Take 1 tablet by mouth daily.     pregabalin (LYRICA) 150 MG capsule TAKE ONE CAPSULE BY MOUTH THREE TIMES A DAY WITH ONE ADDITIONAL CAPSULE IF NEEDED FOR PAIN 120 capsule 2   tizanidine (ZANAFLEX) 2 MG capsule Take 1 capsule (2 mg total) by mouth at bedtime as needed for muscle spasms. 30 capsule 1   traMADol (ULTRAM) 50  MG tablet TAKE TWO TABLETS BY MOUTH EVERY 12 HOURS AS NEEDED FOR PAIN. MAY TAKE ONE EXTRA TABLET FOR BREAKTHROUGH PAIN 150 tablet 2   No current facility-administered medications on file prior to visit.    Social History   Tobacco Use   Smoking status: Every Day    Packs/day: 1.00    Years: 28.00    Total pack years: 28.00    Types: Cigarettes    Last attempt to quit: 02/19/2018    Years since quitting: 3.3   Smokeless tobacco: Former   Tobacco comments:    Does Velo. ( has nicotine , no tobacco).   Substance Use Topics   Alcohol use: Not Currently   Drug use: Never    Review of Systems  Constitutional:  Negative for chills and fever.  Respiratory:  Negative for cough.   Cardiovascular:  Negative for chest pain and palpitations.  Gastrointestinal:  Negative for nausea and vomiting.  Musculoskeletal:  Positive for back pain.      Objective:    BP 102/74 (BP Location: Left Arm, Patient Position: Sitting, Cuff Size: Normal)   Pulse 83   Temp 97.8 F (36.6 C) (Oral)   Ht 6\' 1"  (1.854 m)   Wt 158 lb (71.7 kg)   SpO2 97%   BMI 20.85 kg/m  BP Readings from  Last 3 Encounters:  06/16/21 102/74  06/10/21 118/78  05/21/21 124/80   Wt Readings from Last 3 Encounters:  06/16/21 158 lb (71.7 kg)  06/10/21 157 lb (71.2 kg)  05/21/21 149 lb 12.8 oz (67.9 kg)    Physical Exam Vitals reviewed.  Constitutional:      Appearance: He is well-developed.  Cardiovascular:     Rate and Rhythm: Regular rhythm.     Heart sounds: Normal heart sounds.  Pulmonary:     Effort: Pulmonary effort is normal. No respiratory distress.     Breath sounds: Normal breath sounds. No wheezing, rhonchi or rales.  Skin:    General: Skin is warm and dry.  Neurological:     Mental Status: He is alert.  Psychiatric:        Speech: Speech normal.        Behavior: Behavior normal.        Assessment & Plan:   Problem List Items Addressed This Visit       Endocrine   Diabetes mellitus  without complication ([place]) - Primary   Relevant Orders   Lipid panel     Other   Chronic midline low back pain without sciatica    Chronic, stable.  Continue Lyrica 150 mg 3 times daily, tramadol 100 mg BID and tramadol 50mg  as needed daily for breakthrough, tizanidine 2mg  prn. He is also taking ibuprofen 800mg  QD and counseled him on risk of GERD, PUD particularly when taking daily. He declines second opinion, evaluation for alternative intervention with Dr Sharlet Salina.       HLD (hyperlipidemia)    Pending lipid panel. Continue zetia 10mg .       Right-sided chest pain     I am having Philip Stephens maintain his Multi-Vitamin, Cholecalciferol, Clotrimazole, Aspirin-Salicylamide-Caffeine (ARTHRITIS STRENGTH BC POWDER PO), tizanidine, metoprolol succinate, traMADol, ezetimibe, pregabalin, and esomeprazole.   No orders of the defined types were placed in this encounter.   Return precautions given.   Risks, benefits, and alternatives of the medications and treatment plan prescribed today were discussed, and patient expressed understanding.   Education regarding symptom management and diagnosis given to patient on AVS.  Continue to follow with Philip Hawthorne, FNP for routine health maintenance.   [name] and I agreed with plan.   Mable Paris, FNP

## 2021-06-16 NOTE — Assessment & Plan Note (Signed)
Pending lipid panel. Continue zetia 10mg .

## 2021-06-17 ENCOUNTER — Encounter: Payer: Self-pay | Admitting: Cardiovascular Disease

## 2021-06-17 ENCOUNTER — Other Ambulatory Visit: Payer: Self-pay | Admitting: Cardiovascular Disease

## 2021-06-17 LAB — CBC WITH DIFFERENTIAL/PLATELET
Absolute Monocytes: 855 cells/uL (ref 200–950)
Basophils Absolute: 46 cells/uL (ref 0–200)
Basophils Relative: 0.4 %
Eosinophils Absolute: 376 cells/uL (ref 15–500)
Eosinophils Relative: 3.3 %
HCT: 42.2 % (ref 38.5–50.0)
Hemoglobin: 14.1 g/dL (ref 13.2–17.1)
Lymphs Abs: 2542 cells/uL (ref 850–3900)
MCH: 29 pg (ref 27.0–33.0)
MCHC: 33.4 g/dL (ref 32.0–36.0)
MCV: 86.8 fL (ref 80.0–100.0)
MPV: 10.9 fL (ref 7.5–12.5)
Monocytes Relative: 7.5 %
Neutro Abs: 7581 cells/uL (ref 1500–7800)
Neutrophils Relative %: 66.5 %
Platelets: 274 10*3/uL (ref 140–400)
RBC: 4.86 10*6/uL (ref 4.20–5.80)
RDW: 14 % (ref 11.0–15.0)
Total Lymphocyte: 22.3 %
WBC: 11.4 10*3/uL — ABNORMAL HIGH (ref 3.8–10.8)

## 2021-06-17 LAB — HEMOGLOBIN A1C
Hgb A1c MFr Bld: 5.5 % of total Hgb (ref <5.7)
Mean Plasma Glucose: 111 mg/dL
eAG (mmol/L): 6.2 mmol/L

## 2021-06-18 ENCOUNTER — Telehealth: Payer: Self-pay

## 2021-06-18 NOTE — Telephone Encounter (Signed)
Replied to patient's MyChart message. Closing this encounter.

## 2021-06-18 NOTE — Telephone Encounter (Signed)
MyChart message from patient:  Dr.Gollan  my primary is wanting me to start taking a 5mg  crestor and was wondering what is your thought on that and if you could would you please look over my blood work and see what you think because you told me the other month I didn't need to because of my frame and how active I was, so was wondering your thought on this and hope your doing well.     Patient had lipid panel drawn 06/16/21    I will forward to Dr.Gollan for review.

## 2021-06-25 ENCOUNTER — Telehealth: Payer: Self-pay

## 2021-06-25 ENCOUNTER — Other Ambulatory Visit: Payer: Self-pay

## 2021-06-25 DIAGNOSIS — G8929 Other chronic pain: Secondary | ICD-10-CM

## 2021-06-25 DIAGNOSIS — E782 Mixed hyperlipidemia: Secondary | ICD-10-CM

## 2021-06-25 MED ORDER — ROSUVASTATIN CALCIUM 5 MG PO TABS: 5.0000 mg | ORAL_TABLET | Freq: Every day | ORAL | 3 refills | Status: DC

## 2021-06-25 MED ORDER — PREGABALIN 150 MG PO CAPS: ORAL_CAPSULE | ORAL | 0 refills | Status: DC

## 2021-06-25 NOTE — Telephone Encounter (Signed)
Refill sent to pharmacy.   

## 2021-06-25 NOTE — Telephone Encounter (Signed)
What medication is this for?  There is no medication listed in your message

## 2021-06-26 ENCOUNTER — Other Ambulatory Visit: Payer: Self-pay | Admitting: Family

## 2021-07-22 ENCOUNTER — Telehealth: Payer: Self-pay | Admitting: *Deleted

## 2021-07-22 NOTE — Telephone Encounter (Signed)
Please place FUTURE orders for lab appt.    Appt note states CBC & CMP only. No orders found.  However, there is a hepatic test ordered incorrectly. If needed, please reorder it as well.

## 2021-07-23 ENCOUNTER — Other Ambulatory Visit: Payer: Self-pay | Admitting: Family

## 2021-07-23 DIAGNOSIS — R899 Unspecified abnormal finding in specimens from other organs, systems and tissues: Secondary | ICD-10-CM

## 2021-07-25 ENCOUNTER — Encounter: Payer: Self-pay | Admitting: Family

## 2021-07-26 ENCOUNTER — Other Ambulatory Visit: Payer: 59

## 2021-07-27 ENCOUNTER — Ambulatory Visit: Payer: 59 | Admitting: Internal Medicine

## 2021-07-29 ENCOUNTER — Ambulatory Visit (INDEPENDENT_AMBULATORY_CARE_PROVIDER_SITE_OTHER): Payer: 59

## 2021-07-29 ENCOUNTER — Encounter: Payer: Self-pay | Admitting: Internal Medicine

## 2021-07-29 ENCOUNTER — Ambulatory Visit: Payer: 59

## 2021-07-29 ENCOUNTER — Ambulatory Visit (INDEPENDENT_AMBULATORY_CARE_PROVIDER_SITE_OTHER): Payer: 59 | Admitting: Internal Medicine

## 2021-07-29 VITALS — BP 124/62 | HR 77 | Temp 98.0°F | Ht 73.0 in | Wt 160.8 lb

## 2021-07-29 DIAGNOSIS — M25562 Pain in left knee: Secondary | ICD-10-CM | POA: Diagnosis not present

## 2021-07-29 DIAGNOSIS — R899 Unspecified abnormal finding in specimens from other organs, systems and tissues: Secondary | ICD-10-CM | POA: Diagnosis not present

## 2021-07-29 DIAGNOSIS — M25561 Pain in right knee: Secondary | ICD-10-CM | POA: Diagnosis not present

## 2021-07-29 MED ORDER — METHYLPREDNISOLONE 4 MG PO TBPK: ORAL_TABLET | ORAL | 0 refills | Status: DC

## 2021-07-29 NOTE — Progress Notes (Signed)
Chief Complaint  Patient presents with   Knee Pain    Pt has left knee pain he had it wrapped up said it was swollen but the swelling has gone down, he has been experiencing knee pain for about 3 wks now.    F/u  1. B/l knee pain right gets disclocated at times and left knee swollen and painful x 2-3 weeks tried rest, ice elevation, and ace wrap and otc ibuprofen when he was 15 had injury with chainsaw and h/o steroid shot in the knee as well pain 6/10     Review of Systems  Constitutional:  Negative for weight loss.  HENT:  Negative for hearing loss.   Eyes:  Negative for blurred vision.  Respiratory:  Negative for shortness of breath.   Cardiovascular:  Negative for chest pain.  Gastrointestinal:  Negative for abdominal pain and blood in stool.  Musculoskeletal:  Positive for joint pain. Negative for back pain.  Skin:  Negative for rash.  Neurological:  Negative for headaches.  Psychiatric/Behavioral:  Negative for depression.    Past Medical History:  Diagnosis Date   Arthritis    Hypertension    No past surgical history on file. Family History  Problem Relation Age of Onset   Anxiety disorder Mother    Hyperlipidemia Father    Hearing loss Father    Diabetes Father    Arthritis Father    Coronary artery disease Maternal Grandfather 71   Social History   Socioeconomic History   Marital status: Married    Spouse name: Not on file   Number of children: Not on file   Years of education: Not on file   Highest education level: Not on file  Occupational History   Not on file  Tobacco Use   Smoking status: Every Day    Packs/day: 1.00    Years: 28.00    Total pack years: 28.00    Types: Cigarettes    Last attempt to quit: 02/19/2018    Years since quitting: 3.4   Smokeless tobacco: Former   Tobacco comments:    Does Velo. ( has nicotine , no tobacco).   Vaping Use   Vaping Use: Not on file  Substance and Sexual Activity   Alcohol use: Not Currently   Drug use:  Never   Sexual activity: Yes  Other Topics Concern   Not on file  Social History Narrative   Works at [employer].    2 boys- 7 and 15   1 daughter 82   Social Determinants of Corporate investment banker Strain: Not on file  Food Insecurity: Not on file  Transportation Needs: Not on file  Physical Activity: Not on file  Stress: Not on file  Social Connections: Not on file  Intimate Partner Violence: Not on file   Current Meds  Medication Sig   Aspirin-Salicylamide-Caffeine (ARTHRITIS STRENGTH BC POWDER PO) Take by mouth daily as needed.   Cholecalciferol 25 MCG (1000 UT) capsule Take by mouth.   Clotrimazole 1 % OINT Apply 1 application topically 2 (two) times daily.   cyclobenzaprine (FLEXERIL) 10 MG tablet TAKE ONE TABLET BY MOUTH THREE TIMES A DAY AS NEEDED FOR MUSCLE SPASMS   esomeprazole (NEXIUM) 20 MG capsule Take 1 capsule (20 mg total) by mouth 2 (two) times daily before a meal.   ezetimibe (ZETIA) 10 MG tablet TAKE ONE TABLET BY MOUTH DAILY   metoprolol succinate (TOPROL-XL) 25 MG 24 hr tablet Take 1 tablet (25 mg total)  by mouth daily.   Multiple Vitamin (MULTI-VITAMIN) tablet Take 1 tablet by mouth daily.   pregabalin (LYRICA) 150 MG capsule TAKE ONE CAPSULE BY MOUTH THREE TIMES A DAY WITH ONE ADDITIONAL CAPSULE IF NEEDED FOR PAIN   rosuvastatin (CRESTOR) 5 MG tablet Take 1 tablet (5 mg total) by mouth daily.   tizanidine (ZANAFLEX) 2 MG capsule Take 1 capsule (2 mg total) by mouth at bedtime as needed for muscle spasms.   traMADol (ULTRAM) 50 MG tablet TAKE TWO TABLETS BY MOUTH EVERY 12 HOURS AS NEEDED FOR PAIN. MAY TAKE ONE EXTRA TABLET FOR BREAKTHROUGH PAIN   Allergies  Allergen Reactions   Penicillins Swelling   Recent Results (from the past 2160 hour(s))  Lipid panel     Status: Abnormal   Collection Time: 06/16/21 12:17 PM  Result Value Ref Range   Cholesterol 201 (H) 0 - 200 mg/dL    Comment: ATP III Classification       Desirable:  < 200 mg/dL                Borderline High:  200 - 239 mg/dL          High:  > = 161 mg/dL   Triglycerides 096.0 0.0 - 149.0 mg/dL    Comment: Normal:  <454 mg/dLBorderline High:  150 - 199 mg/dL   HDL 09.81 (L) >19.14 mg/dL   VLDL 78.2 0.0 - 95.6 mg/dL   LDL Cholesterol 213 (H) 0 - 99 mg/dL   Total CHOL/HDL Ratio 5     Comment:                Men          Women1/2 Average Risk     3.4          3.3Average Risk          5.0          4.42X Average Risk          9.6          7.13X Average Risk          15.0          11.0                       NonHDL 162.89     Comment: NOTE:  Non-HDL goal should be 30 mg/dL higher than patient's LDL goal (i.e. LDL goal of < 70 mg/dL, would have non-HDL goal of < 100 mg/dL)  CBC with Differential/Platelet     Status: Abnormal   Collection Time: 06/16/21 12:17 PM  Result Value Ref Range   WBC 11.4 (H) 3.8 - 10.8 Thousand/uL   RBC 4.86 4.20 - 5.80 Million/uL   Hemoglobin 14.1 13.2 - 17.1 g/dL   HCT 08.6 57.8 - 46.9 %   MCV 86.8 80.0 - 100.0 fL   MCH 29.0 27.0 - 33.0 pg   MCHC 33.4 32.0 - 36.0 g/dL   RDW 62.9 52.8 - 41.3 %   Platelets 274 140 - 400 Thousand/uL   MPV 10.9 7.5 - 12.5 fL   Neutro Abs 7,581 1,500 - 7,800 cells/uL   Lymphs Abs 2,542 850 - 3,900 cells/uL   Absolute Monocytes 855 200 - 950 cells/uL   Eosinophils Absolute 376 15 - 500 cells/uL   Basophils Absolute 46 0 - 200 cells/uL   Neutrophils Relative % 66.5 %   Total Lymphocyte 22.3 %   Monocytes Relative 7.5 %  Eosinophils Relative 3.3 %   Basophils Relative 0.4 %  Hemoglobin A1c     Status: None   Collection Time: 06/16/21 12:17 PM  Result Value Ref Range   Hgb A1c MFr Bld 5.5 <5.7 % of total Hgb    Comment: For the purpose of screening for the presence of diabetes: . <5.7%       Consistent with the absence of diabetes 5.7-6.4%    Consistent with increased risk for diabetes             (prediabetes) > or =6.5%  Consistent with diabetes . This assay result is consistent with a decreased risk of  diabetes. . Currently, no consensus exists regarding use of hemoglobin A1c for diagnosis of diabetes in children. . According to American Diabetes Association (ADA) guidelines, hemoglobin A1c <7.0% represents optimal control in non-pregnant diabetic patients. Different metrics may apply to specific patient populations.  Standards of Medical Care in Diabetes(ADA). .    Mean Plasma Glucose 111 mg/dL   eAG (mmol/L) 6.2 mmol/L   Objective  Body mass index is 21.22 kg/m. Wt Readings from Last 3 Encounters:  07/29/21 160 lb 12.8 oz (72.9 kg)  06/16/21 158 lb (71.7 kg)  06/10/21 157 lb (71.2 kg)   Temp Readings from Last 3 Encounters:  07/29/21 98 F (36.7 C) (Oral)  06/16/21 97.8 F (36.6 C) (Oral)  06/10/21 (!) 97.5 F (36.4 C) (Oral)   BP Readings from Last 3 Encounters:  07/29/21 124/62  06/16/21 102/74  06/10/21 118/78   Pulse Readings from Last 3 Encounters:  07/29/21 77  06/16/21 83  06/10/21 87    Physical Exam Vitals and nursing note reviewed.  Constitutional:      Appearance: Normal appearance. He is well-developed and well-groomed.  HENT:     Head: Normocephalic and atraumatic.  Eyes:     Conjunctiva/sclera: Conjunctivae normal.     Pupils: Pupils are equal, round, and reactive to light.  Cardiovascular:     Rate and Rhythm: Normal rate and regular rhythm.     Heart sounds: Normal heart sounds.  Pulmonary:     Effort: Pulmonary effort is normal. No respiratory distress.     Breath sounds: Normal breath sounds.  Abdominal:     Tenderness: There is no abdominal tenderness.  Musculoskeletal:     Right knee: Swelling present. No tenderness.     Left knee: Swelling present. Tenderness present over the lateral joint line.  Skin:    General: Skin is warm and moist.  Neurological:     General: No focal deficit present.     Mental Status: He is alert and oriented to person, place, and time. Mental status is at baseline.     Sensory: Sensation is intact.      Motor: Motor function is intact.     Coordination: Coordination is intact.     Gait: Gait is intact. Gait normal.  Psychiatric:        Attention and Perception: Attention and perception normal.        Mood and Affect: Mood and affect normal.        Speech: Speech normal.        Behavior: Behavior normal. Behavior is cooperative.        Thought Content: Thought content normal.        Cognition and Memory: Cognition and memory normal.        Judgment: Judgment normal.     Assessment  Plan  Acute pain of both  knees - Plan: DG Knee Complete 4 Views Left, DG Knee Complete 4 Views Right Consider ortho Consider oral steroids in the future pt prefers over steroid shot   Abnormal laboratory test - Plan: CBC with Differential/Platelet, Comprehensive metabolic panel  Provider: Dr. French Ana McLean-Scocuzza-Internal Medicine

## 2021-07-29 NOTE — Patient Instructions (Addendum)
Aspercream with lidocaine   Knee Exercises Ask your health care provider which exercises are safe for you. Do exercises exactly as told by your health care provider and adjust them as directed. It is normal to feel mild stretching, pulling, tightness, or discomfort as you do these exercises. Stop right away if you feel sudden pain or your pain gets worse. Do not begin these exercises until told by your health care provider. Stretching and range-of-motion exercises These exercises warm up your muscles and joints and improve the movement and flexibility of your knee. These exercises also help to relieve pain and swelling. Knee extension, prone  Lie on your abdomen (prone position) on a bed. Place your left / right knee just beyond the edge of the surface so your knee is not on the bed. You can put a towel under your left / right thigh just above your kneecap for comfort. Relax your leg muscles and allow gravity to straighten your knee (extension). You should feel a stretch behind your left / right knee. Hold this position for __________ seconds. Scoot up so your knee is supported between repetitions. Repeat __________ times. Complete this exercise __________ times a day. Knee flexion, active  Lie on your back with both legs straight. If this causes back discomfort, bend your left / right knee so your foot is flat on the floor. Slowly slide your left / right heel back toward your buttocks. Stop when you feel a gentle stretch in the front of your knee or thigh (flexion). Hold this position for __________ seconds. Slowly slide your left / right heel back to the starting position. Repeat __________ times. Complete this exercise __________ times a day. Quadriceps stretch, prone  Lie on your abdomen on a firm surface, such as a bed or padded floor. Bend your left / right knee and hold your ankle. If you cannot reach your ankle or pant leg, loop a belt around your foot and grab the belt  instead. Gently pull your heel toward your buttocks. Your knee should not slide out to the side. You should feel a stretch in the front of your thigh and knee (quadriceps). Hold this position for __________ seconds. Repeat __________ times. Complete this exercise __________ times a day. Hamstring, supine  Lie on your back (supine position). Loop a belt or towel over the ball of your left / right foot. The ball of your foot is on the walking surface, right under your toes. Straighten your left / right knee and slowly pull on the belt to raise your leg until you feel a gentle stretch behind your knee (hamstring). Do not let your knee bend while you do this. Keep your other leg flat on the floor. Hold this position for __________ seconds. Repeat __________ times. Complete this exercise __________ times a day. Strengthening exercises These exercises build strength and endurance in your knee. Endurance is the ability to use your muscles for a long time, even after they get tired. Quadriceps, isometric This exercise strengthens the muscles in front of your thigh (quadriceps) without moving your knee joint (isometric). Lie on your back with your left / right leg extended and your other knee bent. Put a rolled towel or small pillow under your knee if told by your health care provider. Slowly tense the muscles in the front of your left / right thigh. You should see your kneecap slide up toward your hip or see increased dimpling just above the knee. This motion will push the back of  the knee toward the floor. For __________ seconds, hold the muscle as tight as you can without increasing your pain. Relax the muscles slowly and completely. Repeat __________ times. Complete this exercise __________ times a day. Straight leg raises This exercise strengthens the muscles in front of your thigh (quadriceps) and the muscles that move your hips (hip flexors). Lie on your back with your left / right leg extended  and your other knee bent. Tense the muscles in the front of your left / right thigh. You should see your kneecap slide up or see increased dimpling just above the knee. Your thigh may even shake a bit. Keep these muscles tight as you raise your leg 4-6 inches (10-15 cm) off the floor. Do not let your knee bend. Hold this position for __________ seconds. Keep these muscles tense as you lower your leg. Relax your muscles slowly and completely after each repetition. Repeat __________ times. Complete this exercise __________ times a day. Hamstring, isometric  Lie on your back on a firm surface. Bend your left / right knee about __________ degrees. Dig your left / right heel into the surface as if you are trying to pull it toward your buttocks. Tighten the muscles in the back of your thighs (hamstring) to "dig" as hard as you can without increasing any pain. Hold this position for __________ seconds. Release the tension gradually and allow your muscles to relax completely for __________ seconds after each repetition. Repeat __________ times. Complete this exercise __________ times a day. Hamstring curls If told by your health care provider, do this exercise while wearing ankle weights. Begin with __________lb / kg weights. Then increase the weight by 1 lb (0.5 kg) increments. Do not wear ankle weights that are more than __________lb / kg. Lie on your abdomen with your legs straight. Bend your left / right knee as far as you can without feeling pain. Keep your hips flat against the floor. Hold this position for __________ seconds. Slowly lower your leg to the starting position. Repeat __________ times. Complete this exercise __________ times a day. Squats This exercise strengthens the muscles in front of your thigh and knee (quadriceps). Stand in front of a table, with your feet and knees pointing straight ahead. You may rest your hands on the table for balance but not for support. Slowly bend  your knees and lower your hips like you are going to sit in a chair. Keep your weight over your heels, not over your toes. Keep your lower legs upright so they are parallel with the table legs. Do not let your hips go lower than your knees. Do not bend lower than told by your health care provider. If your knee pain increases, do not bend as low. Hold the squat position for __________ seconds. Slowly push with your legs to return to standing. Do not use your hands to pull yourself to standing. Repeat __________ times. Complete this exercise __________ times a day. Wall slides This exercise strengthens the muscles in front of your thigh and knee (quadriceps). Lean your back against a smooth wall or door, and walk your feet out 18-24 inches (46-61 cm) from it. Place your feet hip-width apart. Slowly slide down the wall or door until your knees bend __________ degrees. Keep your knees over your heels, not over your toes. Keep your knees in line with your hips. Hold this position for __________ seconds. Repeat __________ times. Complete this exercise __________ times a day. Straight leg raises, side-lying This exercise  strengthens the muscles that rotate the leg at the hip and move it away from your body (hip abductors). Lie on your side with your left / right leg in the top position. Lie so your head, shoulder, knee, and hip line up. You may bend your bottom knee to help you keep your balance. Roll your hips slightly forward so your hips are stacked directly over each other and your left / right knee is facing forward. Leading with your heel, lift your top leg 4-6 inches (10-15 cm). You should feel the muscles in your outer hip lifting. Do not let your foot drift forward. Do not let your knee roll toward the ceiling. Hold this position for __________ seconds. Slowly return your leg to the starting position. Let your muscles relax completely after each repetition. Repeat __________ times.  Complete this exercise __________ times a day. Straight leg raises, prone This exercise stretches the muscles that move your hips away from the front of the pelvis (hip extensors). Lie on your abdomen on a firm surface. You can put a pillow under your hips if that is more comfortable. Tense the muscles in your buttocks and lift your left / right leg about 4-6 inches (10-15 cm). Keep your knee straight as you lift your leg. Hold this position for __________ seconds. Slowly lower your leg to the starting position. Let your leg relax completely after each repetition. Repeat __________ times. Complete this exercise __________ times a day. This information is not intended to replace advice given to you by your health care provider. Make sure you discuss any questions you have with your health care provider. Document Revised: 09/01/2020 Document Reviewed: 09/01/2020 Elsevier Patient Education  2023 [employer]  Knee Effusion Knee effusion refers to excess fluid in the knee joint. This can cause pain and swelling in your knee. Knee effusion creates more pressure than usual in your knee joint. This makes it more difficult for you to bend and move your knee. If there is fluid in your knee, it often means that something is wrong inside your knee. This can be a result of: Severe arthritis. Injury to the knee muscles or to tissues in the knee (ligaments or cartilage). Infection. Autoimmune disease. This means that your body's defense system (immune system) mistakenly attacks healthy body tissues. Follow these instructions at home: If you have a brace or an immobilizer: Wear it as told by your health care provider. Remove it only as told by your health care provider. Check the skin around it every day. Tell your health care provider about any concerns. Loosen it if your toes tingle, become numb, or turn cold and blue. Keep it clean. If the brace or immobilizer is not waterproof: Do not let it get  wet. Cover it with a watertight covering when you take a bath or shower. Managing pain, stiffness, and swelling  If directed, put ice on the affected area. To do this: If you have a removable brace or immobilizer, remove it as told by your health care provider. Put ice in a plastic bag. Place a towel between your skin and the bag. Leave the ice on for 20 minutes, 2-3 times a day. Remove the ice if your skin turns bright red. This is very important. If you cannot feel pain, heat, or cold, you have a greater risk of damage to the area. Move your toes often to reduce stiffness and swelling. Raise (elevate) your knee above the level of your heart while you are sitting  or lying down. Activity Rest as told by your health care provider. Do not use the injured limb to support your body weight until your health care provider says that you can. Use crutches as told by your health care provider. Do exercises as told by your health care provider. General instructions Take over-the-counter and prescription medicines only as told by your health care provider. Do not use any products that contain nicotine or tobacco. These products include cigarettes, chewing tobacco, and vaping devices, such as e-cigarettes. If you need help quitting, ask your health care provider. Wear an elastic bandage or a wrap that puts pressure on your knee (compression wrap) as told by your health care provider. Keep all follow-up visits. This is important. Contact a health care provider if: You continue to have pain in your knee. You have a fever or chills. Get help right away if: You have swelling or redness in your knee that gets worse or does not get better. You have severe pain in your knee. Summary Knee effusion refers to excess fluid in the knee joint. This causes pain and swelling and makes it difficult to bend and move your knee. Effusion may be caused by severe arthritis, autoimmune disease, infection, or injury to  the knee muscles or to tissues in the knee (ligaments or cartilage). Take over-the-counter and prescription medicines only as told by your health care provider. If you have a brace or an immobilizer, wear it as told by your health care provider. This information is not intended to replace advice given to you by your health care provider. Make sure you discuss any questions you have with your health care provider. Document Revised: 08/21/2019 Document Reviewed: 08/21/2019 Elsevier Patient Education  2023 [employer].

## 2021-07-29 NOTE — Addendum Note (Signed)
Addended by: Quentin Ore on: 07/29/2021 04:36 PM   Modules accepted: Orders

## 2021-07-30 ENCOUNTER — Other Ambulatory Visit: Payer: Self-pay | Admitting: Family

## 2021-07-30 LAB — CBC WITH DIFFERENTIAL/PLATELET
Basophils Absolute: 0.1 10*3/uL (ref 0.0–0.1)
Basophils Relative: 1 % (ref 0.0–3.0)
Eosinophils Absolute: 0.3 10*3/uL (ref 0.0–0.7)
Eosinophils Relative: 2.3 % (ref 0.0–5.0)
HCT: 43.3 % (ref 39.0–52.0)
Hemoglobin: 14.2 g/dL (ref 13.0–17.0)
Lymphocytes Relative: 21.6 % (ref 12.0–46.0)
Lymphs Abs: 3.1 10*3/uL (ref 0.7–4.0)
MCHC: 32.8 g/dL (ref 30.0–36.0)
MCV: 90.3 fl (ref 78.0–100.0)
Monocytes Absolute: 1.1 10*3/uL — ABNORMAL HIGH (ref 0.1–1.0)
Monocytes Relative: 7.5 % (ref 3.0–12.0)
Neutro Abs: 9.7 10*3/uL — ABNORMAL HIGH (ref 1.4–7.7)
Neutrophils Relative %: 67.6 % (ref 43.0–77.0)
Platelets: 260 10*3/uL (ref 150.0–400.0)
RBC: 4.79 Mil/uL (ref 4.22–5.81)
RDW: 15.1 % (ref 11.5–15.5)
WBC: 14.3 10*3/uL — ABNORMAL HIGH (ref 4.0–10.5)

## 2021-07-30 LAB — COMPREHENSIVE METABOLIC PANEL
ALT: 13 U/L (ref 0–53)
AST: 18 U/L (ref 0–37)
Albumin: 4.5 g/dL (ref 3.5–5.2)
Alkaline Phosphatase: 95 U/L (ref 39–117)
BUN: 12 mg/dL (ref 6–23)
CO2: 27 mEq/L (ref 19–32)
Calcium: 9.1 mg/dL (ref 8.4–10.5)
Chloride: 101 mEq/L (ref 96–112)
Creatinine, Ser: 0.98 mg/dL (ref 0.40–1.50)
GFR: 92.21 mL/min (ref >60.00)
Glucose, Bld: 71 mg/dL (ref 70–99)
Potassium: 4.1 mEq/L (ref 3.5–5.1)
Sodium: 141 mEq/L (ref 135–145)
Total Bilirubin: 0.4 mg/dL (ref 0.2–1.2)
Total Protein: 6.9 g/dL (ref 6.0–8.3)

## 2021-07-30 MED ORDER — CYCLOBENZAPRINE HCL 10 MG PO TABS: 10.0000 mg | ORAL_TABLET | Freq: Three times a day (TID) | ORAL | 0 refills | Status: DC

## 2021-08-03 ENCOUNTER — Encounter: Payer: Self-pay | Admitting: Family

## 2021-08-04 ENCOUNTER — Other Ambulatory Visit: Payer: Self-pay | Admitting: Family

## 2021-08-04 DIAGNOSIS — D72829 Elevated white blood cell count, unspecified: Secondary | ICD-10-CM

## 2021-08-06 ENCOUNTER — Other Ambulatory Visit: Payer: 59

## 2021-08-06 NOTE — Progress Notes (Signed)
Reordered lab, was ordered normal instead of Future by CMA

## 2021-08-06 NOTE — Addendum Note (Signed)
Addended by: Warden Fillers on: 08/06/2021 11:14 AM   Modules accepted: Orders

## 2021-08-16 ENCOUNTER — Other Ambulatory Visit: Payer: 59

## 2021-08-16 ENCOUNTER — Encounter: Payer: 59 | Admitting: Oncology

## 2021-08-19 ENCOUNTER — Other Ambulatory Visit: Payer: Self-pay | Admitting: Family

## 2021-08-19 DIAGNOSIS — M545 Low back pain, unspecified: Secondary | ICD-10-CM

## 2021-08-23 ENCOUNTER — Encounter: Payer: Self-pay | Admitting: Oncology

## 2021-08-23 ENCOUNTER — Other Ambulatory Visit: Payer: Self-pay | Admitting: *Deleted

## 2021-08-23 ENCOUNTER — Inpatient Hospital Stay: Payer: 59

## 2021-08-23 ENCOUNTER — Inpatient Hospital Stay: Payer: 59 | Attending: Oncology | Admitting: Oncology

## 2021-08-23 VITALS — BP 113/77 | HR 82 | Temp 98.8°F | Resp 18 | Wt 161.5 lb

## 2021-08-23 DIAGNOSIS — D729 Disorder of white blood cells, unspecified: Secondary | ICD-10-CM

## 2021-08-23 DIAGNOSIS — D72828 Other elevated white blood cell count: Secondary | ICD-10-CM | POA: Insufficient documentation

## 2021-08-23 LAB — CBC WITH DIFFERENTIAL/PLATELET
Abs Immature Granulocytes: 0.08 10*3/uL — ABNORMAL HIGH (ref 0.00–0.07)
Basophils Absolute: 0.1 10*3/uL (ref 0.0–0.1)
Basophils Relative: 0 %
Eosinophils Absolute: 0.4 10*3/uL (ref 0.0–0.5)
Eosinophils Relative: 2 %
HCT: 43.4 % (ref 39.0–52.0)
Hemoglobin: 14.4 g/dL (ref 13.0–17.0)
Immature Granulocytes: 0 %
Lymphocytes Relative: 17 %
Lymphs Abs: 3.1 10*3/uL (ref 0.7–4.0)
MCH: 29.8 pg (ref 26.0–34.0)
MCHC: 33.2 g/dL (ref 30.0–36.0)
MCV: 89.9 fL (ref 80.0–100.0)
Monocytes Absolute: 0.8 10*3/uL (ref 0.1–1.0)
Monocytes Relative: 4 %
Neutro Abs: 14.1 10*3/uL — ABNORMAL HIGH (ref 1.7–7.7)
Neutrophils Relative %: 77 %
Platelets: 240 10*3/uL (ref 150–400)
RBC: 4.83 MIL/uL (ref 4.22–5.81)
RDW: 14.7 % (ref 11.5–15.5)
WBC: 18.5 10*3/uL — ABNORMAL HIGH (ref 4.0–10.5)
nRBC: 0 % (ref 0.0–0.2)

## 2021-08-23 LAB — SEDIMENTATION RATE: Sed Rate: 8 mm/hr (ref 0–15)

## 2021-08-23 NOTE — Progress Notes (Signed)
k  Hematology/Oncology Consult note [facility] [phone] [phone]  Patient Care Team: Burnard Hawthorne, FNP as PCP - General (Family Medicine) Minna Merritts, MD as PCP - Cardiology (Cardiology)   Name of the patient: Philip Stephens  [id number]  05-21-1975    Reason for referral-leukocytosis   Referring [id number], FNP  Date of visit: 08/23/21   History of presenting illness-patient is a 46 year old male with a past medical history significant for GERD hyperlipidemia who has been referred forLeukocytosis.  Most recent CBC on 07/29/2021 showed white cell count of 14.3, H&H of 14.2/43.3 with a platelet count of 260.  Differential showed absolute neutrophilia with an ANC of 9.7 and mildly elevated monocyte count of 1.1.  Patient has had a normal white cell count up until August 2022.  In December 2022 he was noted to have white cell count of 12.1, 11.4 and 14.3 on 3 different occasions over the last 7 months.  Hemoglobin and platelets have been normal.  Patient reports ongoing joint pain especially in his knees.  He has restarted smoking about a year ago.Reviewed 2 doses of patient states that he has lost weight.  On review of his prior weight he has lost about 5 pounds in 1 year  ECOG PS- 0  Pain scale- 3   Review of systems- Review of Systems  Constitutional:  Positive for malaise/fatigue. Negative for chills, fever and weight loss.  HENT:  Negative for congestion, ear discharge and nosebleeds.   Eyes:  Negative for blurred vision.  Respiratory:  Negative for cough, hemoptysis, sputum production, shortness of breath and wheezing.   Cardiovascular:  Negative for chest pain, palpitations, orthopnea and claudication.  Gastrointestinal:  Negative for abdominal pain, blood in stool, constipation, diarrhea, heartburn, melena, nausea and vomiting.  Genitourinary:  Negative for dysuria, flank pain, frequency, hematuria and  urgency.  Musculoskeletal:  Positive for joint pain. Negative for back pain and myalgias.  Skin:  Negative for rash.  Neurological:  Negative for dizziness, tingling, focal weakness, seizures, weakness and headaches.  Endo/Heme/Allergies:  Does not bruise/bleed easily.  Psychiatric/Behavioral:  Negative for depression and suicidal ideas. The patient does not have insomnia.     Allergies  Allergen Reactions   Penicillins Swelling    Patient Active Problem List   Diagnosis Date Noted   No-show for appointment 12/23/2020   Rash 11/03/2020   Diabetes mellitus without complication ([place]) 99/83/3825   HLD (hyperlipidemia) 09/09/2020   LLQ abdominal pain 09/08/2020   Abdominal bloating 06/17/2020   COVID-19 12/20/2019   Screening for colon cancer 12/20/2019   ANA positive 06/24/2019   Arthralgia 06/24/2019   Neck pain, musculoskeletal 06/24/2019   Localized swelling of right lower leg 05/28/2019   Right knee pain 05/28/2019   Hand swelling 05/17/2019   Chronic neck pain 04/02/2019   Tinnitus 04/02/2019   Coronary artery calcification 11/14/2018   Epigastric pain 08/03/2018   Dysuria 07/27/2018   Right-sided chest pain 07/27/2018   Impotence 05/25/2018   Numbness in both hands 04/18/2018   Fatigue 03/14/2018   Anxiety 01/15/2018   Gastroesophageal reflux disease 01/15/2018   Chronic midline low back pain without sciatica 01/01/2018     Past Medical History:  Diagnosis Date   Arthritis    Hypertension      History reviewed. No pertinent surgical history.  Social History   Socioeconomic History   Marital status: Married    Spouse name: Not on file   Number of children: Not on  file   Years of education: Not on file   Highest education level: Not on file  Occupational History   Not on file  Tobacco Use   Smoking status: Every Day    Packs/day: 1.00    Years: 28.00    Total pack years: 28.00    Types: Cigarettes    Last attempt to quit: 02/19/2018    Years since  quitting: 3.5   Smokeless tobacco: Former   Tobacco comments:    Does Velo. ( has nicotine , no tobacco).   Vaping Use   Vaping Use: Not on file  Substance and Sexual Activity   Alcohol use: Not Currently   Drug use: Never   Sexual activity: Yes  Other Topics Concern   Not on file  Social History Narrative   Works at [employer].    2 boys- 7 and 15   1 daughter 50   Social Determinants of Radio broadcast assistant Strain: Not on file  Food Insecurity: Not on file  Transportation Needs: Not on file  Physical Activity: Not on file  Stress: Not on file  Social Connections: Not on file  Intimate Partner Violence: Not on file     Family History  Problem Relation Age of Onset   Anxiety disorder Mother    Hyperlipidemia Father    Hearing loss Father    Diabetes Father    Arthritis Father    Coronary artery disease Maternal Grandfather 73     Current Outpatient Medications:    Aspirin-Salicylamide-Caffeine (ARTHRITIS STRENGTH BC POWDER PO), Take by mouth daily as needed., Disp: , Rfl:    Cholecalciferol 25 MCG (1000 UT) capsule, Take by mouth., Disp: , Rfl:    ezetimibe (ZETIA) 10 MG tablet, TAKE ONE TABLET BY MOUTH DAILY, Disp: 30 tablet, Rfl: 6   methylPREDNISolone (MEDROL DOSEPAK) 4 MG TBPK tablet, Use as directed with food in the am, Disp: 21 tablet, Rfl: 0   metoprolol succinate (TOPROL-XL) 25 MG 24 hr tablet, Take 1 tablet (25 mg total) by mouth daily., Disp: 90 tablet, Rfl: 1   Multiple Vitamin (MULTI-VITAMIN) tablet, Take 1 tablet by mouth daily., Disp: , Rfl:    pregabalin (LYRICA) 150 MG capsule, TAKE ONE CAPSULE BY MOUTH THREE TIMES A DAY WITH ONE ADDITIONAL CAPSULE IF NEEDED FOR PAIN, Disp: 120 capsule, Rfl: 0   rosuvastatin (CRESTOR) 5 MG tablet, Take 1 tablet (5 mg total) by mouth daily., Disp: 30 tablet, Rfl: 3   traMADol (ULTRAM) 50 MG tablet, TAKE TWO TABLETS BY MOUTH EVERY 12 HOURS AS NEEDED FOR PAIN. MAY TAKE ONE EXTRA TABLET FOR BREAKTHROUGH PAIN, Disp: 150  tablet, Rfl: 2   Clotrimazole 1 % OINT, Apply 1 application topically 2 (two) times daily., Disp: 56.7 g, Rfl: 1   cyclobenzaprine (FLEXERIL) 10 MG tablet, Take 1 tablet (10 mg total) by mouth 3 (three) times daily. (Patient not taking: Reported on 08/23/2021), Disp: 30 tablet, Rfl: 0   esomeprazole (NEXIUM) 20 MG capsule, Take 1 capsule (20 mg total) by mouth 2 (two) times daily before a meal., Disp: 120 capsule, Rfl: 1   tizanidine (ZANAFLEX) 2 MG capsule, Take 1 capsule (2 mg total) by mouth at bedtime as needed for muscle spasms. (Patient not taking: Reported on 08/23/2021), Disp: 30 capsule, Rfl: 1   Physical exam:  Vitals:   08/23/21 1433  BP: 113/77  Pulse: 82  Resp: 18  Temp: 98.8 F (37.1 C)  SpO2: 97%  Weight: 161 lb 8 oz (73.3  kg)   Physical Exam Constitutional:      General: He is not in acute distress. Cardiovascular:     Rate and Rhythm: Normal rate and regular rhythm.     Heart sounds: Normal heart sounds.  Pulmonary:     Effort: Pulmonary effort is normal.     Breath sounds: Normal breath sounds.  Abdominal:     General: Bowel sounds are normal.     Palpations: Abdomen is soft.     Comments: No palpable hepatosplenomegaly  Lymphadenopathy:     Comments: No palpable cervical, supraclavicular, axillary or inguinal adenopathy    Skin:    General: Skin is warm and dry.  Neurological:     Mental Status: He is alert and oriented to person, place, and time.           Latest Ref Rng & Units 07/29/2021    2:34 PM  CMP  Glucose 70 - 99 mg/dL 71   BUN 6 - 23 mg/dL 12   Creatinine 0.40 - 1.50 mg/dL 0.98   Sodium 135 - 145 mEq/L 141   Potassium 3.5 - 5.1 mEq/L 4.1   Chloride 96 - 112 mEq/L 101   CO2 19 - 32 mEq/L 27   Calcium 8.4 - 10.5 mg/dL 9.1   Total Protein 6.0 - 8.3 g/dL 6.9   Total Bilirubin 0.2 - 1.2 mg/dL 0.4   Alkaline Phos 39 - 117 U/L 95   AST 0 - 37 U/L 18   ALT 0 - 53 U/L 13       Latest Ref Rng & Units 07/29/2021    2:34 PM  CBC  WBC 4.0  - 10.5 K/uL 14.3   Hemoglobin 13.0 - 17.0 g/dL 14.2   Hematocrit 39.0 - 52.0 % 43.3   Platelets 150.0 - 400.0 K/uL 260.0     No images are attached to the encounter.  DG Knee Complete 4 Views Left  Result Date: 07/29/2021 CLINICAL DATA:  Left knee pain and swelling for the past 3 weeks. EXAM: LEFT KNEE - COMPLETE 4+ VIEW COMPARISON:  None. FINDINGS: Mild to moderate medial joint space narrowing and minimal spur formation. Small effusion. No fracture or dislocation seen. IMPRESSION: Mild medial joint space degenerative changes and small effusion. Electronically Signed   By: Claudie Revering M.D.   On: 07/29/2021 14:50   DG Knee Complete 4 Views Right  Result Date: 07/29/2021 CLINICAL DATA:  Recurrent right knee dislocations. EXAM: RIGHT KNEE - COMPLETE 4+ VIEW COMPARISON:  None Available. FINDINGS: Moderate medial joint space narrowing and minimal spur formation. No fracture, dislocation or effusion. IMPRESSION: Mild medial joint space degenerative changes. Electronically Signed   By: Claudie Revering M.D.   On: 07/29/2021 14:47    Assessment and plan- Patient is a 46 y.o. male referred for leukocytosis/neutrophilia  Patient has had mild leukocytosis with a white cell count as well asRated between 11-14 for the last 8 months.  No other thrombophilia or abnormal hemoglobin.  Differential mainly shows neutrophilia.He does have an elevated ESR of 13 that was noted 8 months ago and I will plan to repeat it today.  I still suspect that his neutrophilia is likely reactive and given that it is mild and plan to monitor this conservatively without a flow cytometry or bone marrow biopsy at this time.  I am repeating a CBC with differential and ESR today.  We will repeat CBC again in 3 in 6 months and I will see him back in 6 months.  There is a persistent upward trend in his white cell count I will consider further work-up   Thank you for this kind referral and the opportunity to participate in the care of this  patient   Visit Diagnosis 1. Neutrophilia     Dr. Randa Evens, MD, MPH [facility] [phone] 08/23/2021    Addendum: Patient's white cell count came back at 18.  I am therefore ordering additional tests including JAK2, CALR, MPL and flow cytometry.  I have recommended that patient should consider seeing rheumatology for his arthritis pain as his neutrophilia could be secondary to that as well especially in the light of elevated ESR

## 2021-08-25 LAB — COMP PANEL: LEUKEMIA/LYMPHOMA

## 2021-08-28 ENCOUNTER — Encounter: Payer: Self-pay | Admitting: Oncology

## 2021-08-29 LAB — JAK2 GENOTYPR

## 2021-08-30 LAB — MPL MUTATION ANALYSIS

## 2021-09-01 LAB — CALRETICULIN (CALR) MUTATION ANALYSIS

## 2021-09-09 ENCOUNTER — Encounter: Payer: Self-pay | Admitting: Family

## 2021-09-10 ENCOUNTER — Other Ambulatory Visit: Payer: Self-pay | Admitting: Family

## 2021-09-10 DIAGNOSIS — G8929 Other chronic pain: Secondary | ICD-10-CM

## 2021-09-10 DIAGNOSIS — M25561 Pain in right knee: Secondary | ICD-10-CM

## 2021-09-22 ENCOUNTER — Telehealth (INDEPENDENT_AMBULATORY_CARE_PROVIDER_SITE_OTHER): Payer: 59 | Admitting: Family

## 2021-09-22 DIAGNOSIS — M545 Low back pain, unspecified: Secondary | ICD-10-CM | POA: Diagnosis not present

## 2021-09-22 DIAGNOSIS — G8929 Other chronic pain: Secondary | ICD-10-CM | POA: Diagnosis not present

## 2021-09-22 DIAGNOSIS — D72829 Elevated white blood cell count, unspecified: Secondary | ICD-10-CM | POA: Insufficient documentation

## 2021-09-22 NOTE — Assessment & Plan Note (Signed)
Chronic, stable.  Suboptimal control.  Pleased patient is seeing [facility] physiatry for second opinion.  Will follow closely.  Continue regimen compliant with Lyrica 150 mg 3 times daily, tramadol 100 mg twice daily, and tramadol 50 mg as needed for breakthrough pain.

## 2021-09-22 NOTE — Progress Notes (Signed)
Virtual Visit via Video Note  I connected with@  on 09/22/21 at  4:00 PM EDT by a video enabled telemedicine application and verified that I am speaking with the correct person using two identifiers.  Location patient: home Location provider:work  Persons participating in the virtual visit: patient, provider  I discussed the limitations of evaluation and management by telemedicine and the availability of in person appointments. The patient expressed understanding and agreed to proceed.   HPI:  Overall feels well today.  Chronic low back pain is at baseline.  He has days where it is worse than others.  He is compliant with Lyrica 150 mg 3 times daily, tramadol 100 mg twice daily, and tramadol 50 mg as needed for breakthrough pain.   He has an appointment scheduled next month with physiatry for back pain    last a1c 5.5  Consult Dr. Smith Robert for neutrophilia which is suspected is likely reactive given that it is mild.  She plans to monitor conservatively .  Started prednisone 07/29/21   Completed flow cytometry, JAK2, CRP  ROS: See pertinent positives and negatives per HPI.    EXAM:  VITALS per patient if applicable: There were no vitals taken for this visit. BP Readings from Last 3 Encounters:  08/23/21 113/77  07/29/21 124/62  06/16/21 102/74   Wt Readings from Last 3 Encounters:  08/23/21 161 lb 8 oz (73.3 kg)  07/29/21 160 lb 12.8 oz (72.9 kg)  06/16/21 158 lb (71.7 kg)    GENERAL: alert, oriented, appears well and in no acute distress  HEENT: atraumatic, conjunttiva clear, no obvious abnormalities on inspection of external nose and ears  NECK: normal movements of the head and neck  LUNGS: on inspection no signs of respiratory distress, breathing rate appears normal, no obvious gross SOB, gasping or wheezing  CV: no obvious cyanosis  MS: moves all visible extremities without noticeable abnormality  PSYCH/NEURO: pleasant and cooperative, no obvious depression or  anxiety, speech and thought processing grossly intact  ASSESSMENT AND PLAN:  Discussed the following assessment and plan:  Problem List Items Addressed This Visit       Other   Chronic midline low back pain without sciatica    Chronic, stable.  Suboptimal control.  Pleased patient is seeing [facility] physiatry for second opinion.  Will follow closely.  Continue regimen compliant with Lyrica 150 mg 3 times daily, tramadol 100 mg twice daily, and tramadol 50 mg as needed for breakthrough pain.       Leukocytosis - Primary    Discussed leukocytosis.  Recent prednisone use.  Repeat CBC.  I sent a message Dr. Smith Robert as patient  stated she requested rheumatology consult.  I have asked her to provide more context . Sed rate recently normal.       Relevant Orders   CBC with Differential/Platelet    -we discussed possible serious and likely etiologies, options for evaluation and workup, limitations of telemedicine visit vs in person visit, treatment, treatment risks and precautions. Pt prefers to treat via telemedicine empirically rather then risking or undertaking an in person visit at this moment.  .   I discussed the assessment and treatment plan with the patient. The patient was provided an opportunity to ask questions and all were answered. The patient agreed with the plan and demonstrated an understanding of the instructions.   The patient was advised to call back or seek an in-person evaluation if the symptoms worsen or if the condition fails to improve as  anticipated.   Mable Paris, FNP

## 2021-09-22 NOTE — Assessment & Plan Note (Signed)
Discussed leukocytosis.  Recent prednisone use.  Repeat CBC.  I sent a message Dr. Janese Banks as patient  stated she requested rheumatology consult.  I have asked her to provide more context . Sed rate recently normal.

## 2021-09-23 ENCOUNTER — Encounter: Payer: Self-pay | Admitting: Family

## 2021-09-23 ENCOUNTER — Telehealth: Payer: Self-pay | Admitting: Family

## 2021-09-23 DIAGNOSIS — R768 Other specified abnormal immunological findings in serum: Secondary | ICD-10-CM

## 2021-09-23 NOTE — Telephone Encounter (Signed)
-----   Message from Sindy Guadeloupe, MD sent at 09/23/2021  9:12 AM EDT ----- I think his elevated wbc is all reactive peripehral blood work up was negative. He reports significant joint pain and I therefore recommended he see rheumatology despite negative ana ----- Message ----- From: Burnard Hawthorne, FNP Sent: 09/22/2021   4:23 PM EDT To: Sindy Guadeloupe, MD  Dr. Janese Banks,  [facility] you are doing well.  [name] had sent me a message and that he wanted to see rheumatology per your recommendation.    I was not so sure if this might have been a misunderstanding.  Reviewing his previous labs sed rate was normal. Previous ANA normal 12/2020.   I have him set up to see Dr. Leanord Hawking  as it relates to chronic low back pain, knee pain.    If you feel otherwise the patient should see rheumatology, obviously more than happy to place this referral I just wanted more context .    Joycelyn Schmid

## 2021-09-24 ENCOUNTER — Telehealth: Payer: Self-pay

## 2021-09-24 NOTE — Telephone Encounter (Signed)
I left a voicemail for patient asking him to please call us back to schedule a 58-month follow-up with Mable Paris, NP, and labs in 1-2 weeks from his MyChart visit on 09/22/2021.

## 2021-09-29 ENCOUNTER — Other Ambulatory Visit (INDEPENDENT_AMBULATORY_CARE_PROVIDER_SITE_OTHER): Payer: 59

## 2021-09-29 ENCOUNTER — Encounter: Payer: Self-pay | Admitting: Oncology

## 2021-09-29 DIAGNOSIS — D72829 Elevated white blood cell count, unspecified: Secondary | ICD-10-CM

## 2021-09-29 DIAGNOSIS — M545 Low back pain, unspecified: Secondary | ICD-10-CM

## 2021-09-29 LAB — CBC WITH DIFFERENTIAL/PLATELET
Basophils Absolute: 0 10*3/uL (ref 0.0–0.1)
Basophils Relative: 0.3 % (ref 0.0–3.0)
Eosinophils Absolute: 0.3 10*3/uL (ref 0.0–0.7)
Eosinophils Relative: 2.9 % (ref 0.0–5.0)
HCT: 42.9 % (ref 39.0–52.0)
Hemoglobin: 14 g/dL (ref 13.0–17.0)
Lymphocytes Relative: 23.2 % (ref 12.0–46.0)
Lymphs Abs: 2.6 10*3/uL (ref 0.7–4.0)
MCHC: 32.7 g/dL (ref 30.0–36.0)
MCV: 90.8 fl (ref 78.0–100.0)
Monocytes Absolute: 0.8 10*3/uL (ref 0.1–1.0)
Monocytes Relative: 7.4 % (ref 3.0–12.0)
Neutro Abs: 7.4 10*3/uL (ref 1.4–7.7)
Neutrophils Relative %: 66.2 % (ref 43.0–77.0)
Platelets: 213 10*3/uL (ref 150.0–400.0)
RBC: 4.72 Mil/uL (ref 4.22–5.81)
RDW: 14.1 % (ref 11.5–15.5)
WBC: 11.1 10*3/uL — ABNORMAL HIGH (ref 4.0–10.5)

## 2021-09-30 LAB — LYME DISEASE SEROLOGY W/REFLEX: Lyme Total Antibody EIA: NEGATIVE

## 2021-10-12 ENCOUNTER — Telehealth (INDEPENDENT_AMBULATORY_CARE_PROVIDER_SITE_OTHER): Payer: 59 | Admitting: Family Medicine

## 2021-10-12 DIAGNOSIS — R059 Cough, unspecified: Secondary | ICD-10-CM | POA: Diagnosis not present

## 2021-10-12 MED ORDER — PROMETHAZINE-DM 6.25-15 MG/5ML PO SYRP: 5.0000 mL | ORAL_SOLUTION | Freq: Four times a day (QID) | ORAL | 0 refills | Status: DC | PRN

## 2021-10-12 NOTE — Patient Instructions (Addendum)
   ---------------------------------------------------------------------------------------------------------------------------      WORK SLIP:  Patient Philip Stephens,  06-Jun-1975, was seen for a medical visit today, 10/12/21 . Please excuse from work for a COVID/flu like illness. If Covid19 testing is positive, patient will likely be contagious for an average of 7-10 days from the onset of symptoms, PLUS 1 day of no fever and improved symptoms. Other viruses tend to be contagious for 5-10 days on average. Will defer to employer for a sooner return to work if patient has 2 negative covid tests 48 hours apart and is feeling better, OR if symptoms have improved, it is greater than 5 days since onset of symptoms and the patient can wear a high-quality, tight fitting mask such as N95 or KN95 at all times for an additional 5 days.  Sincerely: E-signature: Dr. Colin Benton, DO [place] Ph: [phone]   ------------------------------------------------------------------------------------------------------------------------------   -I sent the medication(s) we discussed to your pharmacy: Meds ordered this encounter  Medications   promethazine-dextromethorphan (PROMETHAZINE-DM) 6.25-15 MG/5ML syrup    Sig: Take 5 mLs by mouth 4 (four) times daily as needed for cough.    Dispense:  118 mL    Refill:  0   Nasal saline sinus rinse twice daily.  Consider retesting for covid.   I hope you are feeling better soon!  Seek in person care promptly if your symptoms worsen, new concerns arise or you are not improving with treatment.  It was nice to meet you today. I help [facility] out with telemedicine visits on Tuesdays and Thursdays and am happy to help if you need a virtual follow up visit on those days. Otherwise, if you have any concerns or questions following this visit please schedule a follow up visit with your Primary Care office or seek care at a local urgent care  clinic to avoid delays in care. If you are having severe or life threatening symptoms please call 911 and/or go to the nearest emergency room.

## 2021-10-12 NOTE — Progress Notes (Signed)
Virtual Visit via Video Note  I connected with Philip Stephens  on 10/12/21 at  5:20 PM EDT by a video enabled telemedicine application and verified that I am speaking with the correct person using two identifiers.  Location patient: [place] Location provider:work or home office Persons participating in the virtual visit: patient, provider  I discussed the limitations and requested verbal permission for telemedicine visit. The patient expressed understanding and agreed to proceed.   HPI:  Acute telemedicine visit for cough and congestion: -Onset: 3-4 days ago -Symptoms include: sore throat, cough, congestion, some body aches, a little more tired than usual -a covid test was negative -Denies:fevers, CP, SOB, NVD -son was sick too, before he was -Has tried:dayquil, musinex -Pertinent past medical history: see below -Pertinent medication allergies: Allergies  Allergen Reactions   Penicillins Swelling  -COVID-19 vaccine status:  Immunization History  Administered Date(s) Administered   Influenza-Unspecified 08/06/2018     ROS: See pertinent positives and negatives per HPI.  Past Medical History:  Diagnosis Date   Arthritis    Hypertension     No past surgical history on file.   Current Outpatient Medications:    promethazine-dextromethorphan (PROMETHAZINE-DM) 6.25-15 MG/5ML syrup, Take 5 mLs by mouth 4 (four) times daily as needed for cough., Disp: 118 mL, Rfl: 0   Aspirin-Salicylamide-Caffeine (ARTHRITIS STRENGTH BC POWDER PO), Take by mouth daily as needed., Disp: , Rfl:    Cholecalciferol 25 MCG (1000 UT) capsule, Take by mouth., Disp: , Rfl:    cyclobenzaprine (FLEXERIL) 10 MG tablet, Take 1 tablet (10 mg total) by mouth 3 (three) times daily., Disp: 30 tablet, Rfl: 0   esomeprazole (NEXIUM) 20 MG capsule, Take 1 capsule (20 mg total) by mouth 2 (two) times daily before a meal., Disp: 120 capsule, Rfl: 1   ezetimibe (ZETIA) 10 MG tablet, TAKE ONE TABLET BY MOUTH DAILY, Disp: 30  tablet, Rfl: 6   metoprolol succinate (TOPROL-XL) 25 MG 24 hr tablet, Take 1 tablet (25 mg total) by mouth daily., Disp: 90 tablet, Rfl: 1   Multiple Vitamin (MULTI-VITAMIN) tablet, Take 1 tablet by mouth daily., Disp: , Rfl:    pregabalin (LYRICA) 150 MG capsule, TAKE ONE CAPSULE BY MOUTH THREE TIMES A DAY WITH ONE ADDITIONAL CAPSULE IF NEEDED FOR PAIN, Disp: 120 capsule, Rfl: 0   rosuvastatin (CRESTOR) 5 MG tablet, Take 1 tablet (5 mg total) by mouth daily., Disp: 30 tablet, Rfl: 3   tizanidine (ZANAFLEX) 2 MG capsule, Take 1 capsule (2 mg total) by mouth at bedtime as needed for muscle spasms. (Patient not taking: Reported on 08/23/2021), Disp: 30 capsule, Rfl: 1   traMADol (ULTRAM) 50 MG tablet, TAKE TWO TABLETS BY MOUTH EVERY 12 HOURS AS NEEDED FOR PAIN. MAY TAKE ONE EXTRA TABLET FOR BREAKTHROUGH PAIN, Disp: 150 tablet, Rfl: 2  EXAM:  VITALS per patient if applicable:  GENERAL: alert, oriented, appears well and in no acute distress  HEENT: atraumatic, conjunttiva clear, no obvious abnormalities on inspection of external nose and ears  NECK: normal movements of the head and neck  LUNGS: on inspection no signs of respiratory distress, breathing rate appears normal, no obvious gross SOB, gasping or wheezing  CV: no obvious cyanosis  MS: moves all visible extremities without noticeable abnormality  PSYCH/NEURO: pleasant and cooperative, no obvious depression or anxiety, speech and thought processing grossly intact  ASSESSMENT AND PLAN:  Discussed the following assessment and plan:  Cough, unspecified type  -we discussed possible serious and likely etiologies, options for evaluation and  workup, limitations of telemedicine visit vs in person visit, treatment, treatment risks and precautions. Pt is agreeable to treatment via telemedicine at this moment. Query vuri, covid with false neg testing vs other. Discussed possibility of flu was well. Opted for cough rx (discussed risks) and nasal  saline rinse. Advised retesting for covid.  Work/School slipped offered: provided in patient instructions   Advised to seek prompt virtual visit or in person care if worsening, new symptoms arise, or if is not improving with treatment as expected per our conversation of expected course. Discussed options for follow up care. Did let this patient know that I do telemedicine on Tuesdays and Thursdays for [place] and those are the days I am logged into the system. Advised to schedule follow up visit with PCP,  virtual visits or [facility] if any further questions or concerns to avoid delays in care.   I discussed the assessment and treatment plan with the patient. The patient was provided an opportunity to ask questions and all were answered. The patient agreed with the plan and demonstrated an understanding of the instructions.     Lucretia Kern, DO

## 2021-10-14 ENCOUNTER — Ambulatory Visit: Payer: 59 | Admitting: Internal Medicine

## 2021-10-20 ENCOUNTER — Encounter: Payer: Self-pay | Admitting: Family

## 2021-10-20 ENCOUNTER — Telehealth (INDEPENDENT_AMBULATORY_CARE_PROVIDER_SITE_OTHER): Payer: 59 | Admitting: Family

## 2021-10-20 DIAGNOSIS — J40 Bronchitis, not specified as acute or chronic: Secondary | ICD-10-CM | POA: Insufficient documentation

## 2021-10-20 MED ORDER — AZITHROMYCIN 250 MG PO TABS: ORAL_TABLET | ORAL | 0 refills | Status: AC

## 2021-10-20 MED ORDER — HYDROCOD POLI-CHLORPHE POLI ER 10-8 MG/5ML PO SUER: 5.0000 mL | Freq: Every evening | ORAL | 0 refills | Status: DC | PRN

## 2021-10-20 NOTE — Assessment & Plan Note (Signed)
Afebrile.  No acute respiratory distress.  Based on duration of symptoms, advised patient most likely bacterial URI at this time.  We opted to start azithromycin.  Has previously has done quite well on Tussionex and has failed alternative over-the-counter and prescription cough medications.  Counseled on the risk of sedation with Tussionex particularly as he is on Lyrica and tramadol.  He very much understood this.  He will stored in a safe place.  No alcohol use.  He will let me know how he is doing.  I looked up patient on [place] Controlled Substances Reporting System PMP AWARE and saw no activity that raised concern of inappropriate use.

## 2021-10-20 NOTE — Progress Notes (Signed)
Virtual Visit via Video Note  I connected with@  on 10/20/21 at 10:00 AM EDT by a video enabled telemedicine application and verified that I am speaking with the correct person using two identifiers.  Location patient: home Location provider:work  Persons participating in the virtual visit: patient, provider  I discussed the limitations of evaluation and management by telemedicine and the availability of in person appointments. The patient expressed understanding and agreed to proceed.   HPI: Productive cough x 2 weeks, unchanged Endorses sore throat which has resolved.  He had HA , since has resolved.   No fever, facial pain, chills, sob, wheezing ,pleurisy.   He tried promethazine DM, alki-seltzer, mucinex, tessalon perles, robitussin without relief.   He has previously been on tussionex without side effects. He didn't find sedating  Covid negative this morning; previously negative.   he vapes. No alcohol use.   Seen Dr Maudie Mercury virtually  10/12/21 and provided promethazine DM   ROS: See pertinent positives and negatives per HPI.    EXAM:  VITALS per patient if applicable: There were no vitals taken for this visit. BP Readings from Last 3 Encounters:  08/23/21 113/77  07/29/21 124/62  06/16/21 102/74   Wt Readings from Last 3 Encounters:  08/23/21 161 lb 8 oz (73.3 kg)  07/29/21 160 lb 12.8 oz (72.9 kg)  06/16/21 158 lb (71.7 kg)    GENERAL: alert, oriented, appears well and in no acute distress  HEENT: atraumatic, conjunttiva clear, no obvious abnormalities on inspection of external nose and ears  NECK: normal movements of the head and neck  LUNGS: on inspection no signs of respiratory distress, breathing rate appears normal, no obvious gross SOB, gasping or wheezing  CV: no obvious cyanosis  MS: moves all visible extremities without noticeable abnormality  PSYCH/NEURO: pleasant and cooperative, no obvious depression or anxiety, speech and thought processing  grossly intact  ASSESSMENT AND PLAN:  Discussed the following assessment and plan:  Problem List Items Addressed This Visit       Respiratory   Bronchitis - Primary    Afebrile.  No acute respiratory distress.  Based on duration of symptoms, advised patient most likely bacterial URI at this time.  We opted to start azithromycin.  Has previously has done quite well on Tussionex and has failed alternative over-the-counter and prescription cough medications.  Counseled on the risk of sedation with Tussionex particularly as he is on Lyrica and tramadol.  He very much understood this.  He will stored in a safe place.  No alcohol use.  He will let me know how he is doing.  I looked up patient on [place] Controlled Substances Reporting System PMP AWARE and saw no activity that raised concern of inappropriate use.        Relevant Medications   azithromycin (ZITHROMAX) 250 MG tablet   chlorpheniramine-HYDROcodone (TUSSIONEX) 10-8 MG/5ML    -we discussed possible serious and likely etiologies, options for evaluation and workup, limitations of telemedicine visit vs in person visit, treatment, treatment risks and precautions. Pt prefers to treat via telemedicine empirically rather then risking or undertaking an in person visit at this moment.  .   I discussed the assessment and treatment plan with the patient. The patient was provided an opportunity to ask questions and all were answered. The patient agreed with the plan and demonstrated an understanding of the instructions.   The patient was advised to call back or seek an in-person evaluation if the symptoms worsen or if the  condition fails to improve as anticipated.   Mable Paris, FNP

## 2021-10-20 NOTE — Patient Instructions (Signed)
Start azithromycin (antibiotic).  I provided you with Tussionex cough syrup  Please take cough medication at night only as needed. As we discussed, I do not recommend dosing throughout the day as coughing is a protective mechanism . It also helps to break up thick mucous.  Do not take cough suppressants with alcohol as can lead to trouble breathing. Advise caution if taking cough suppressant and operating machinery ( i.e driving a car) as you may feel very tired.      Ensure to take probiotics while on antibiotics and also for 2 weeks after completion. This can either be by eating yogurt daily or taking a probiotic supplement over the counter such as Culturelle.It is important to re-colonize the gut with good bacteria and also to prevent any diarrheal infections associated with antibiotic use.    Please let me know how you are doing

## 2021-10-20 NOTE — Progress Notes (Signed)
Coughing up green mucus,

## 2021-10-21 ENCOUNTER — Other Ambulatory Visit: Payer: Self-pay | Admitting: Family

## 2021-10-25 ENCOUNTER — Other Ambulatory Visit: Payer: Self-pay | Admitting: Family

## 2021-10-25 ENCOUNTER — Encounter: Payer: Self-pay | Admitting: Family

## 2021-10-25 DIAGNOSIS — M545 Low back pain, unspecified: Secondary | ICD-10-CM

## 2021-11-19 ENCOUNTER — Telehealth: Payer: Self-pay | Admitting: Family

## 2021-11-19 ENCOUNTER — Other Ambulatory Visit: Payer: Self-pay | Admitting: Family

## 2021-11-19 ENCOUNTER — Encounter: Payer: Self-pay | Admitting: Family

## 2021-11-19 DIAGNOSIS — G8929 Other chronic pain: Secondary | ICD-10-CM

## 2021-11-19 MED ORDER — TRAMADOL HCL 50 MG PO TABS: ORAL_TABLET | ORAL | 2 refills | Status: DC

## 2021-11-19 NOTE — Telephone Encounter (Signed)
Patient called about MyChart note below,   Belenda Cruise hope your well, I sent you a message I think Monday and haven't heard anything back and was wondering if you would call in my refill on my tramadol I take my last one tonight they due on the 17th and that's today and thank you again for all you do for my family

## 2021-11-19 NOTE — Telephone Encounter (Signed)
DONE

## 2021-11-19 NOTE — Progress Notes (Signed)
I looked up patient on [place] Controlled Substances Reporting System PMP AWARE and saw no activity that raised concern of inappropriate use.   

## 2021-11-19 NOTE — Telephone Encounter (Signed)
NOTED

## 2021-11-23 ENCOUNTER — Inpatient Hospital Stay: Payer: 59

## 2021-11-30 ENCOUNTER — Inpatient Hospital Stay: Payer: 59 | Attending: Oncology

## 2021-11-30 DIAGNOSIS — D72828 Other elevated white blood cell count: Secondary | ICD-10-CM | POA: Insufficient documentation

## 2021-11-30 DIAGNOSIS — D729 Disorder of white blood cells, unspecified: Secondary | ICD-10-CM

## 2021-11-30 LAB — CBC WITH DIFFERENTIAL/PLATELET
Abs Immature Granulocytes: 0.03 10*3/uL (ref 0.00–0.07)
Basophils Absolute: 0.1 10*3/uL (ref 0.0–0.1)
Basophils Relative: 0 %
Eosinophils Absolute: 0.3 10*3/uL (ref 0.0–0.5)
Eosinophils Relative: 3 %
HCT: 44.8 % (ref 39.0–52.0)
Hemoglobin: 14.9 g/dL (ref 13.0–17.0)
Immature Granulocytes: 0 %
Lymphocytes Relative: 26 %
Lymphs Abs: 3 10*3/uL (ref 0.7–4.0)
MCH: 29.7 pg (ref 26.0–34.0)
MCHC: 33.3 g/dL (ref 30.0–36.0)
MCV: 89.4 fL (ref 80.0–100.0)
Monocytes Absolute: 0.9 10*3/uL (ref 0.1–1.0)
Monocytes Relative: 7 %
Neutro Abs: 7.3 10*3/uL (ref 1.7–7.7)
Neutrophils Relative %: 64 %
Platelets: 261 10*3/uL (ref 150–400)
RBC: 5.01 MIL/uL (ref 4.22–5.81)
RDW: 14.2 % (ref 11.5–15.5)
WBC: 11.6 10*3/uL — ABNORMAL HIGH (ref 4.0–10.5)
nRBC: 0 % (ref 0.0–0.2)

## 2021-12-01 ENCOUNTER — Encounter: Payer: Self-pay | Admitting: Oncology

## 2021-12-06 ENCOUNTER — Other Ambulatory Visit: Payer: Self-pay | Admitting: *Deleted

## 2021-12-06 DIAGNOSIS — R768 Other specified abnormal immunological findings in serum: Secondary | ICD-10-CM

## 2021-12-12 ENCOUNTER — Other Ambulatory Visit: Payer: Self-pay | Admitting: Cardiovascular Disease

## 2021-12-14 ENCOUNTER — Other Ambulatory Visit: Payer: Self-pay | Admitting: Cardiovascular Disease

## 2021-12-23 ENCOUNTER — Telehealth: Payer: 59 | Admitting: Physician Assistant

## 2021-12-23 DIAGNOSIS — J069 Acute upper respiratory infection, unspecified: Secondary | ICD-10-CM | POA: Diagnosis not present

## 2021-12-23 MED ORDER — PSEUDOEPH-BROMPHEN-DM 30-2-10 MG/5ML PO SYRP: 5.0000 mL | ORAL_SOLUTION | Freq: Four times a day (QID) | ORAL | 0 refills | Status: DC | PRN

## 2021-12-23 NOTE — Progress Notes (Signed)
Virtual Visit Consent   Philip Stephens, you are scheduled for a virtual visit with a [facility] provider today. Just as with appointments in the office, your consent must be obtained to participate. Your consent will be active for this visit and any virtual visit you may have with one of our providers in the next 365 days. If you have a MyChart account, a copy of this consent can be sent to you electronically.  As this is a virtual visit, video technology does not allow for your provider to perform a traditional examination. This may limit your provider's ability to fully assess your condition. If your provider identifies any concerns that need to be evaluated in person or the need to arrange testing (such as labs, EKG, etc.), we will make arrangements to do so. Although advances in technology are sophisticated, we cannot ensure that it will always work on either your end or our end. If the connection with a video visit is poor, the visit may have to be switched to a telephone visit. With either a video or telephone visit, we are not always able to ensure that we have a secure connection.  By engaging in this virtual visit, you consent to the provision of healthcare and authorize for your insurance to be billed (if applicable) for the services provided during this visit. Depending on your insurance coverage, you may receive a charge related to this service.  I need to obtain your verbal consent now. Are you willing to proceed with your visit today? Philip Stephens has provided verbal consent on 12/23/2021 for a virtual visit (video or telephone). Philip Stephens, [place]  Date: 12/23/2021 9:26 AM  Virtual Visit via Video Note   I, Philip Stephens, connected with  Philip Stephens  ([id number], January 29, 1975) on 12/23/21 at  9:00 AM EST by a video-enabled telemedicine application and verified that I am speaking with the correct person using two identifiers.  Location: Patient: Virtual Visit  Location Patient: Home Provider: Virtual Visit Location Provider: Home Office   I discussed the limitations of evaluation and management by telemedicine and the availability of in person appointments. The patient expressed understanding and agreed to proceed.    History of Present Illness: Philip Stephens is a 46 y.o. who identifies as a male who was assigned male at birth, and is being seen today for 2 days of a persistent dry cough, initially with low-grade fever that has resolved. Notes some chest congestion but cough remains dry. Is quite significant and worse at night causing some chest wall tenderness. Denies SOB, pleuritic pain. Took home COVID test that was negative. Is taking OTC Dayquil and Tylenol Cold and Flu    HPI: HPI  Problems:  Patient Active Problem List   Diagnosis Date Noted   Bronchitis 10/20/2021   Leukocytosis 09/22/2021   No-show for appointment 12/23/2020   Rash 11/03/2020   Diabetes mellitus without complication (HCC) 09/09/2020   HLD (hyperlipidemia) 09/09/2020   LLQ abdominal pain 09/08/2020   Abdominal bloating 06/17/2020   COVID-19 12/20/2019   Screening for colon cancer 12/20/2019   ANA positive 06/24/2019   Arthralgia 06/24/2019   Neck pain, musculoskeletal 06/24/2019   Localized swelling of right lower leg 05/28/2019   Right knee pain 05/28/2019   Hand swelling 05/17/2019   Chronic neck pain 04/02/2019   Tinnitus 04/02/2019   Coronary artery calcification 11/14/2018   Epigastric pain 08/03/2018   Dysuria 07/27/2018   Right-sided chest pain 07/27/2018  Impotence 05/25/2018   Numbness in both hands 04/18/2018   Fatigue 03/14/2018   Anxiety 01/15/2018   Gastroesophageal reflux disease 01/15/2018   Chronic midline low back pain without sciatica 01/01/2018    Allergies:  Allergies  Allergen Reactions   Penicillins Swelling   Medications:  Current Outpatient Medications:    brompheniramine-pseudoephedrine-DM 30-2-10 MG/5ML syrup, Take 5  mLs by mouth 4 (four) times daily as needed., Disp: 120 mL, Rfl: 0   Aspirin-Salicylamide-Caffeine (ARTHRITIS STRENGTH BC POWDER PO), Take by mouth daily as needed., Disp: , Rfl:    Cholecalciferol 25 MCG (1000 UT) capsule, Take by mouth., Disp: , Rfl:    cyclobenzaprine (FLEXERIL) 10 MG tablet, Take 1 tablet (10 mg total) by mouth 3 (three) times daily., Disp: 30 tablet, Rfl: 0   esomeprazole (NEXIUM) 20 MG capsule, Take 1 capsule (20 mg total) by mouth 2 (two) times daily before a meal., Disp: 120 capsule, Rfl: 1   ezetimibe (ZETIA) 10 MG tablet, TAKE 1 TABLET BY MOUTH DAILY, Disp: 30 tablet, Rfl: 1   metoprolol succinate (TOPROL-XL) 25 MG 24 hr tablet, TAKE 1 TABLET BY MOUTH DAILY, Disp: 30 tablet, Rfl: 0   Multiple Vitamin (MULTI-VITAMIN) tablet, Take 1 tablet by mouth daily., Disp: , Rfl:    pregabalin (LYRICA) 150 MG capsule, TAKE ONE CAPSULE BY MOUTH THREE TIMES A DAY AND TAKE 1 ADDITIONAL CAPSULE IF NEEDED FOR PAIN, Disp: 120 capsule, Rfl: 2   rosuvastatin (CRESTOR) 5 MG tablet, TAKE 1 TABLET BY MOUTH DAILY, Disp: 30 tablet, Rfl: 3   traMADol (ULTRAM) 50 MG tablet, TAKE TWO TABLETS BY MOUTH EVERY 12 HOURS AS NEEDED FOR PAIN. MAY TAKE ONE EXTRA TABLET FOR BREAKTHROUGH PAIN, Disp: 150 tablet, Rfl: 2  Observations/Objective: Patient is well-developed, well-nourished in no acute distress.  Resting comfortably at home.  Head is normocephalic, atraumatic.  No labored breathing. Speech is clear and coherent with logical content.  Patient is alert and oriented at baseline.   Assessment and Plan: 1. Viral URI - brompheniramine-pseudoephedrine-DM 30-2-10 MG/5ML syrup; Take 5 mLs by mouth 4 (four) times daily as needed.  Dispense: 120 mL; Refill: 0  Mild symptoms. COVID negative. Supportive measures and OTC medications reviewed. Bromfed-Dm per orders.   Follow Up Instructions: I discussed the assessment and treatment plan with the patient. The patient was provided an opportunity to ask  questions and all were answered. The patient agreed with the plan and demonstrated an understanding of the instructions.  A copy of instructions were sent to the patient via MyChart unless otherwise noted below.   The patient was advised to call back or seek an in-person evaluation if the symptoms worsen or if the condition fails to improve as anticipated.  Time:  I spent 10 minutes with the patient via telehealth technology discussing the above problems/concerns.    Philip Climes, PA-C

## 2021-12-23 NOTE — Patient Instructions (Signed)
  [name], thank you for joining [name], PA-C for today's virtual visit.  While this provider is not your primary care provider (PCP), if your PCP is located in our provider database this encounter information will be shared with them immediately following your visit.   A [facility] MyChart account gives you access to today's visit and all your visits, tests, and labs performed at [facility] " click here if you don't have a [facility] MyChart account or go to [email or web address]  Consent: (Patient) Philip Stephens provided verbal consent for this virtual visit at the beginning of the encounter.  Current Medications:  Current Outpatient Medications:    Aspirin-Salicylamide-Caffeine (ARTHRITIS STRENGTH BC POWDER PO), Take by mouth daily as needed., Disp: , Rfl:    chlorpheniramine-HYDROcodone (TUSSIONEX) 10-8 MG/5ML, Take 5 mLs by mouth at bedtime as needed for cough., Disp: 60 mL, Rfl: 0   Cholecalciferol 25 MCG (1000 UT) capsule, Take by mouth., Disp: , Rfl:    cyclobenzaprine (FLEXERIL) 10 MG tablet, Take 1 tablet (10 mg total) by mouth 3 (three) times daily., Disp: 30 tablet, Rfl: 0   esomeprazole (NEXIUM) 20 MG capsule, Take 1 capsule (20 mg total) by mouth 2 (two) times daily before a meal., Disp: 120 capsule, Rfl: 1   ezetimibe (ZETIA) 10 MG tablet, TAKE 1 TABLET BY MOUTH DAILY, Disp: 30 tablet, Rfl: 1   metoprolol succinate (TOPROL-XL) 25 MG 24 hr tablet, TAKE 1 TABLET BY MOUTH DAILY, Disp: 30 tablet, Rfl: 0   Multiple Vitamin (MULTI-VITAMIN) tablet, Take 1 tablet by mouth daily., Disp: , Rfl:    pregabalin (LYRICA) 150 MG capsule, TAKE ONE CAPSULE BY MOUTH THREE TIMES A DAY AND TAKE 1 ADDITIONAL CAPSULE IF NEEDED FOR PAIN, Disp: 120 capsule, Rfl: 2   rosuvastatin (CRESTOR) 5 MG tablet, TAKE 1 TABLET BY MOUTH DAILY, Disp: 30 tablet, Rfl: 3   traMADol (ULTRAM) 50 MG tablet, TAKE TWO TABLETS BY MOUTH EVERY 12 HOURS AS NEEDED FOR PAIN. MAY TAKE ONE  EXTRA TABLET FOR BREAKTHROUGH PAIN, Disp: 150 tablet, Rfl: 2   Medications ordered in this encounter:  No orders of the defined types were placed in this encounter.    *If you need refills on other medications prior to your next appointment, please contact your pharmacy*  Follow-Up: Call back or seek an in-person evaluation if the symptoms worsen or if the condition fails to improve as anticipated.  [facility] [phone]  Other Instructions Please keep hydrated and rest. Take the prescription cough medication as directed along with OTC plain Mucinex. Consider starting a non-drowsy antihistamine like Claritin. Follow-up with [place] or PCP if not resolving.    If you have been instructed to have an in-person evaluation today at a local Urgent Care facility, please use the link below. It will take you to a list of all of our available [facility], including address, phone number and hours of operation. Please do not delay care.  [facility]  If you or a family member do not have a primary care provider, use the link below to schedule a visit and establish care. When you choose a [facility] primary care physician or advanced practice provider, you gain a long-term partner in health. Find a Primary Care Provider  Learn more about [facility]'s in-office and virtual care options: [facility] - Get Care Now

## 2021-12-30 ENCOUNTER — Telehealth: Payer: Self-pay

## 2021-12-30 ENCOUNTER — Encounter: Payer: Self-pay | Admitting: Nurse Practitioner

## 2021-12-30 ENCOUNTER — Telehealth (INDEPENDENT_AMBULATORY_CARE_PROVIDER_SITE_OTHER): Payer: 59 | Admitting: Nurse Practitioner

## 2021-12-30 VITALS — HR 72 | Temp 97.2°F | Ht 73.0 in | Wt 171.0 lb

## 2021-12-30 DIAGNOSIS — J019 Acute sinusitis, unspecified: Secondary | ICD-10-CM | POA: Diagnosis not present

## 2021-12-30 MED ORDER — DOXYCYCLINE HYCLATE 100 MG PO TABS: 100.0000 mg | ORAL_TABLET | Freq: Two times a day (BID) | ORAL | 0 refills | Status: DC

## 2021-12-30 NOTE — Telephone Encounter (Signed)
Called pt to get him started for his telehealth. LMOM to CB

## 2021-12-30 NOTE — Assessment & Plan Note (Signed)
Due to length of symptoms will treat with Doxy 100 BID. It is possible that this began as a virus that has since turned bacterial. Patient has tried multiple OTC medications and Bromfed DM without relief of symptoms. Encouraged patient to complete full course of antibiotics. He declined prescription for Tessalon Perles or cough medication as he still has Bromfed. Encouraged adequate fluid intake and to return to care if symptoms are continuing to not improve or new symptoms develop.

## 2021-12-30 NOTE — Progress Notes (Signed)
MyChart Video Visit    Virtual Visit via Video Note   This visit type was conducted due to national recommendations for restrictions regarding the COVID-19 Pandemic (e.g. social distancing) in an effort to limit this patient's exposure and mitigate transmission in our community. This patient is at least at moderate risk for complications without adequate follow up. This format is felt to be most appropriate for this patient at this time. Physical exam was limited by quality of the video and audio technology used for the visit. CMA was able to get the patient set up on a video visit.  Patient location: Home. Patient and provider in visit Provider location: Office  I discussed the limitations of evaluation and management by telemedicine and the availability of in person appointments. The patient expressed understanding and agreed to proceed.  Visit Date: 12/30/2021  Today's healthcare provider: Bethanie Dicker, NP     Subjective:    Patient ID: Philip Stephens, male    DOB: 02-05-1975, 46 y.o.   MRN: [id number]  Chief Complaint  Patient presents with   Sinus Problem    Headache for 5 days now tested negative for COVID 9 days ago. Coughing up green phlegm. with no temp. Sinuses hurts when touching.     Sinus Problem  Patient reports cough and congestion for approximately 12 days. Reports he lost his taste and smell 9 days ago and tested for COVID at that time which was negative. He was seen by virtual visit on 12/21 and treated for a viral URI with Bromfed DM. He has since regained his taste and smell. He reports a headache for the last 5 days along with a lot of sinus pressure. He states his frontal sinuses are tender to touch.  Respiratory illness:  Cough- Yes, productive  Congestion-    Sinus- Yes   Chest- No  Post nasal drip- Yes  Sore throat- Yes, resolved.  Shortness of breath- No  Fever-Yes, resolved. No fever the last 5 days.   Taste disturbance- Yes, resolved.  Smell  disturbance- Yes, resolved.  Covid vaccination- No  Covid booster- No  Flu vaccination- No Medications- Bromfed DM and Alka Seltzer Plus with no relief in symptoms.    Past Medical History:  Diagnosis Date   Arthritis    Hypertension     No past surgical history on file.  Family History  Problem Relation Age of Onset   Anxiety disorder Mother    Hyperlipidemia Father    Hearing loss Father    Diabetes Father    Arthritis Father    Cancer Maternal Grandfather    Coronary artery disease Maternal Grandfather 32    Social History   Socioeconomic History   Marital status: Married    Spouse name: Not on file   Number of children: Not on file   Years of education: Not on file   Highest education level: Not on file  Occupational History   Not on file  Tobacco Use   Smoking status: Every Day    Packs/day: 1.00    Years: 28.00    Total pack years: 28.00    Types: Cigarettes    Last attempt to quit: 02/19/2018    Years since quitting: 3.8   Smokeless tobacco: Former   Tobacco comments:    Does Velo. ( has nicotine , no tobacco).   Vaping Use   Vaping Use: Not on file  Substance and Sexual Activity   Alcohol use: Not Currently   Drug  use: Never   Sexual activity: Yes  Other Topics Concern   Not on file  Social History Narrative   Works at [employer].    2 boys- 7 and 15   1 daughter 67   Social Determinants of Corporate investment banker Strain: Not on file  Food Insecurity: Not on file  Transportation Needs: Not on file  Physical Activity: Not on file  Stress: Not on file  Social Connections: Not on file  Intimate Partner Violence: Not on file    Outpatient Medications Prior to Visit  Medication Sig Dispense Refill   Aspirin-Salicylamide-Caffeine (ARTHRITIS STRENGTH BC POWDER PO) Take by mouth daily as needed.     Cholecalciferol 25 MCG (1000 UT) capsule Take by mouth.     esomeprazole (NEXIUM) 20 MG capsule Take 1 capsule (20 mg total) by mouth 2 (two) times  daily before a meal. 120 capsule 1   ezetimibe (ZETIA) 10 MG tablet TAKE 1 TABLET BY MOUTH DAILY 30 tablet 1   metoprolol succinate (TOPROL-XL) 25 MG 24 hr tablet TAKE 1 TABLET BY MOUTH DAILY 30 tablet 0   Multiple Vitamin (MULTI-VITAMIN) tablet Take 1 tablet by mouth daily.     pregabalin (LYRICA) 150 MG capsule TAKE ONE CAPSULE BY MOUTH THREE TIMES A DAY AND TAKE 1 ADDITIONAL CAPSULE IF NEEDED FOR PAIN 120 capsule 2   rosuvastatin (CRESTOR) 5 MG tablet TAKE 1 TABLET BY MOUTH DAILY 30 tablet 3   traMADol (ULTRAM) 50 MG tablet TAKE TWO TABLETS BY MOUTH EVERY 12 HOURS AS NEEDED FOR PAIN. MAY TAKE ONE EXTRA TABLET FOR BREAKTHROUGH PAIN 150 tablet 2   brompheniramine-pseudoephedrine-DM 30-2-10 MG/5ML syrup Take 5 mLs by mouth 4 (four) times daily as needed. (Patient not taking: Reported on 12/30/2021) 120 mL 0   cyclobenzaprine (FLEXERIL) 10 MG tablet Take 1 tablet (10 mg total) by mouth 3 (three) times daily. (Patient not taking: Reported on 12/30/2021) 30 tablet 0   No facility-administered medications prior to visit.    Allergies  Allergen Reactions   Penicillins Swelling    ROS See HPI     Objective:    EXAM:   Pulse 72 Comment: Pt reported  Temp (!) 97.2 F (36.2 C) Comment: Pt reported  Ht 6\' 1"  (1.854 m)   Wt 171 lb (77.6 kg) Comment: Pt reported  SpO2 99% Comment: Pt reported  BMI 22.56 kg/m  Wt Readings from Last 3 Encounters:  12/30/21 171 lb (77.6 kg)  08/23/21 161 lb 8 oz (73.3 kg)  07/29/21 160 lb 12.8 oz (72.9 kg)   GENERAL: alert, oriented, appears well and in no acute distress   HEENT: atraumatic, conjunttiva clear, no obvious abnormalities on inspection of external nose and ears   NECK: normal movements of the head and neck   LUNGS: on inspection no signs of respiratory distress, breathing rate appears normal, no obvious gross SOB, gasping or wheezing   CV: no obvious cyanosis   MS: moves all visible extremities without noticeable abnormality    PSYCH/NEURO: pleasant and cooperative, no obvious depression or anxiety, speech and thought processing grossly intact    Assessment & Plan:   Problem List Items Addressed This Visit       Respiratory   Acute non-recurrent sinusitis - Primary    Due to length of symptoms will treat with Doxy 100 BID. It is possible that this began as a virus that has since turned bacterial. Patient has tried multiple OTC medications and Bromfed DM without relief of  symptoms. Encouraged patient to complete full course of antibiotics. He declined prescription for Tessalon Perles or cough medication as he still has Bromfed. Encouraged adequate fluid intake and to return to care if symptoms are continuing to not improve or new symptoms develop.       Relevant Medications   doxycycline (VIBRA-TABS) 100 MG tablet    I am having Shahin C. Bryner start on doxycycline. I am also having him maintain his Multi-Vitamin, Cholecalciferol, Aspirin-Salicylamide-Caffeine (ARTHRITIS STRENGTH BC POWDER PO), esomeprazole, cyclobenzaprine, rosuvastatin, pregabalin, traMADol, ezetimibe, metoprolol succinate, and brompheniramine-pseudoephedrine-DM.  Meds ordered this encounter  Medications   doxycycline (VIBRA-TABS) 100 MG tablet    Sig: Take 1 tablet (100 mg total) by mouth 2 (two) times daily.    Dispense:  14 tablet    Refill:  0    Order Specific Question:   Supervising Provider    Answer:   Birdie Sons, ERIC G [[id number]]    I discussed the assessment and treatment plan with the patient. The patient was provided an opportunity to ask questions and all were answered. The patient agreed with the plan and demonstrated an understanding of the instructions.   The patient was advised to call back or seek an in-person evaluation if the symptoms worsen or if the condition fails to improve as anticipated.   Bethanie Dicker, NP [facility] [phone] (phone) [phone] (fax)  [facility]

## 2022-01-07 ENCOUNTER — Telehealth (INDEPENDENT_AMBULATORY_CARE_PROVIDER_SITE_OTHER): Payer: 59 | Admitting: Family

## 2022-01-07 DIAGNOSIS — M545 Low back pain, unspecified: Secondary | ICD-10-CM

## 2022-01-07 DIAGNOSIS — E782 Mixed hyperlipidemia: Secondary | ICD-10-CM | POA: Diagnosis not present

## 2022-01-07 DIAGNOSIS — E119 Type 2 diabetes mellitus without complications: Secondary | ICD-10-CM | POA: Diagnosis not present

## 2022-01-07 DIAGNOSIS — G8929 Other chronic pain: Secondary | ICD-10-CM

## 2022-01-07 MED ORDER — PREGABALIN 150 MG PO CAPS: ORAL_CAPSULE | ORAL | 2 refills | Status: DC

## 2022-01-07 NOTE — Patient Instructions (Signed)
Nice seeing you today.  Please make sure that you do follow-up with rheumatology, Dr. Benjamine Mola and call to schedule an appointment due to your history of joint pain, positive ANA (autoimmune) lab.

## 2022-01-07 NOTE — Progress Notes (Signed)
Virtual Visit via Video Note  I connected with Philip Stephens on 01/07/22 at 10:30 AM EST by a video enabled telemedicine application and verified that I am speaking with the correct person using two identifiers. Location patient: home Location provider: work  Persons participating in the virtual visit: patient, provider  I discussed the limitations of evaluation and management by telemedicine and the availability of in person appointments. The patient expressed understanding and agreed to proceed.  HPI: Low back pain is improved from prior. He feels regimen is working well.  He takes tramadol 50mg  qam, one tablet at lunch time, one  tablet at bedtime.  He will take additional tramadol if needed once throughout the day. He takes Lyrica 150 mg 3 times daily and 1 additional capsule as needed for pain.  He request refill for Lyrica as he has no refills left  He has phone number for rheumatology and plans to call to schedule.   ROS: See pertinent positives and negatives per HPI.  EXAM:  VITALS per patient if applicable: There were no vitals taken for this visit. BP Readings from Last 3 Encounters:  08/23/21 113/77  07/29/21 124/62  06/16/21 102/74   Wt Readings from Last 3 Encounters:  12/30/21 171 lb (77.6 kg)  08/23/21 161 lb 8 oz (73.3 kg)  07/29/21 160 lb 12.8 oz (72.9 kg)    GENERAL: alert, oriented, appears well and in no acute distress  HEENT: atraumatic, conjunttiva clear, no obvious abnormalities on inspection of external nose and ears  NECK: normal movements of the head and neck  LUNGS: on inspection no signs of respiratory distress, breathing rate appears normal, no obvious gross SOB, gasping or wheezing  CV: no obvious cyanosis  MS: moves all visible extremities without noticeable abnormality  PSYCH/NEURO: pleasant and cooperative, no obvious depression or anxiety, speech and thought processing grossly intact  ASSESSMENT AND PLAN: Chronic midline low back  pain without sciatica Assessment & Plan: Chronic, stable.   Continue regimen compliant with Lyrica 150 mg 3 times daily, with additional 150mg  tablet prn. Max daily dose 600mg  total. Continue tramadol 50mg  qam, one tablet at lunch time, one  tablet at bedtime.  He will take additional tramadol  50mg  prn if needed once throughout the day. Maximum daily dose of tramadol is 200mg /day.   Orders: -     Pregabalin; TAKE ONE CAPSULE BY MOUTH THREE TIMES A DAY AND TAKE 1 ADDITIONAL CAPSULE IF NEEDED FOR PAIN  Dispense: 120 capsule; Refill: 2  Mixed hyperlipidemia -     Lipid panel; Future -     CBC with Differential/Platelet; Future  Diabetes mellitus without complication ([place]) -     Hemoglobin A1c; Future     -we discussed possible serious and likely etiologies, options for evaluation and workup, limitations of telemedicine visit vs in person visit, treatment, treatment risks and precautions. Pt prefers to treat via telemedicine empirically rather then risking or undertaking an in person visit at this moment.    I discussed the assessment and treatment plan with the patient. The patient was provided an opportunity to ask questions and all were answered. The patient agreed with the plan and demonstrated an understanding of the instructions.   The patient was advised to call back or seek an in-person evaluation if the symptoms worsen or if the condition fails to improve as anticipated.  Advised if desired AVS can be mailed or viewed via [facility] if [facility] user.   Mable Paris, FNP

## 2022-01-07 NOTE — Assessment & Plan Note (Signed)
Chronic, stable.   Continue regimen compliant with Lyrica 150 mg 3 times daily, with additional 150mg  tablet prn. Max daily dose 600mg  total. Continue tramadol 50mg  qam, one tablet at lunch time, one  tablet at bedtime.  He will take additional tramadol  50mg  prn if needed once throughout the day. Maximum daily dose of tramadol is 200mg /day.

## 2022-01-10 ENCOUNTER — Other Ambulatory Visit: Payer: Self-pay | Admitting: Cardiovascular Disease

## 2022-01-18 ENCOUNTER — Ambulatory Visit: Payer: 59 | Admitting: Family

## 2022-01-18 ENCOUNTER — Encounter: Payer: Self-pay | Admitting: Family

## 2022-01-19 ENCOUNTER — Other Ambulatory Visit: Payer: Self-pay

## 2022-01-19 ENCOUNTER — Other Ambulatory Visit: Payer: Self-pay | Admitting: Family

## 2022-01-19 DIAGNOSIS — G8929 Other chronic pain: Secondary | ICD-10-CM

## 2022-01-19 MED ORDER — TRAMADOL HCL 50 MG PO TABS: ORAL_TABLET | ORAL | 2 refills | Status: DC

## 2022-02-13 NOTE — Progress Notes (Signed)
Date:  02/14/2022   ID:  Philip Stephens, DOB 1975/06/11, MRN [id number]  Patient Location:  [place] [address]   Provider location:   [facility], [place] office  PCP:  Burnard Hawthorne, FNP  Cardiologist:  Patsy Baltimore   Chief Complaint  Patient presents with   1 year follow up     Patient c/o chest & neck pain that comes and goes. Medications reviewed by the patient verbally.      History of Present Illness:    Philip Stephens is a 47 y.o. male past medical history of Anxiety panic attacks Chronic back pain Former smoker, quit 02/2018 Coronary calcium score of 3 SVT Who presents for follow-up of his chest pain, palpitations  Last seen by myself in clinic February 2023 In follow-up today reports he continues to work at [employer] employees Some stress at work  Chronic neck pain, chest pain Previously was on left side of his body now seems to have moved over to right side of his body, thinks it is musculoskeletal  Tolerating Crestor 5 mg daily but does have some leg discomfort though reports it is tolerable Scheduled to have repeat lab work done through primary care  Reports he is smoking again  CT scan images pulled up and reviewed chest, abdomen No significant aortic atherosclerosis  EKG personally reviewed by myself on todays visit Normal sinus rhythm rate 69 bpm no significant ST-T wave changes  Other past medical history reviewed Last seen in clinic April 2021 Prior history atypical chest pain and evaluated September 2020, musculoskeletal Atypical chest pain symptoms April 2021,   CT scan March 2021, no PE      Past Medical History:  Diagnosis Date   Arthritis    Hypertension    No past surgical history on file.    Allergies:   Penicillins   Social History   Tobacco Use   Smoking status: Every Day    Packs/day: 0.50    Years: 28.00    Total pack years: 14.00     Types: Cigarettes    Last attempt to quit: 02/19/2018    Years since quitting: 3.9   Smokeless tobacco: Former  Substance Use Topics   Alcohol use: Not Currently   Drug use: Never     Current Outpatient Medications on File Prior to Visit  Medication Sig Dispense Refill   Aspirin-Salicylamide-Caffeine (ARTHRITIS STRENGTH BC POWDER PO) Take by mouth daily as needed.     Cholecalciferol 25 MCG (1000 UT) capsule Take by mouth.     cyclobenzaprine (FLEXERIL) 10 MG tablet Take 1 tablet (10 mg total) by mouth 3 (three) times daily. 30 tablet 0   esomeprazole (NEXIUM) 20 MG capsule Take 1 capsule (20 mg total) by mouth 2 (two) times daily before a meal. 120 capsule 1   Multiple Vitamin (MULTI-VITAMIN) tablet Take 1 tablet by mouth daily.     pregabalin (LYRICA) 150 MG capsule TAKE ONE CAPSULE BY MOUTH THREE TIMES A DAY AND TAKE 1 ADDITIONAL CAPSULE IF NEEDED FOR PAIN 120 capsule 2   rosuvastatin (CRESTOR) 5 MG tablet TAKE 1 TABLET BY MOUTH DAILY 30 tablet 3   traMADol (ULTRAM) 50 MG tablet TAKE TWO TABLETS BY MOUTH EVERY 12 HOURS PRN.  MAY TAKE ONE EXTRA TABLET FOR BREAKTHROUGH PAIN 150 tablet 2   No current facility-administered medications on file prior to visit.     Family Hx: The  patient's family history includes Anxiety disorder in his mother; Arthritis in his father; Cancer in his maternal grandfather; Coronary artery disease (age of onset: 58) in his maternal grandfather; Diabetes in his father; Hearing loss in his father; Hyperlipidemia in his father.  ROS:   Please see the history of present illness.    Review of Systems  Constitutional: Negative.   HENT: Negative.    Respiratory: Negative.    Cardiovascular: Negative.   Gastrointestinal: Negative.   Musculoskeletal: Negative.   Neurological: Negative.   Psychiatric/Behavioral: Negative.    All other systems reviewed and are negative.    Labs/Other Tests and Data Reviewed:    Recent Labs: 07/29/2021: ALT 13; BUN 12;  Creatinine, Ser 0.98; Potassium 4.1; Sodium 141 11/30/2021: Hemoglobin 14.9; Platelets 261   Recent Lipid Panel Lab Results  Component Value Date/Time   CHOL 201 (H) 06/16/2021 12:17 PM   TRIG 146.0 06/16/2021 12:17 PM   HDL 37.70 (L) 06/16/2021 12:17 PM   CHOLHDL 5 06/16/2021 12:17 PM   LDLCALC 134 (H) 06/16/2021 12:17 PM   LDLDIRECT 149.0 08/11/2020 10:09 AM    Wt Readings from Last 3 Encounters:  02/14/22 161 lb (73 kg)  12/30/21 171 lb (77.6 kg)  08/23/21 161 lb 8 oz (73.3 kg)     Exam:    BP 110/80 (BP Location: Left Arm, Patient Position: Sitting, Cuff Size: Normal)   Pulse 69   Ht 6' 2"$  (1.88 m)   Wt 161 lb (73 kg)   SpO2 97%   BMI 20.67 kg/m  Constitutional:  oriented to person, place, and time. No distress.  HENT:  Head: Grossly normal Eyes:  no discharge. No scleral icterus.  Neck: No JVD, no carotid bruits  Cardiovascular: Regular rate and rhythm, no murmurs appreciated Pulmonary/Chest: Clear to auscultation bilaterally, no wheezes or rails Abdominal: Soft.  no distension.  no tenderness.  Musculoskeletal: Normal range of motion Neurological:  normal muscle tone. Coordination normal. No atrophy Skin: Skin warm and dry Psychiatric: normal affect, pleasant  ASSESSMENT & PLAN:    Problem List Items Addressed This Visit       Cardiology Problems   HLD (hyperlipidemia)   Coronary artery calcification - Primary     Other   Diabetes mellitus without complication ([place])   Other Visit Diagnoses     Chest pain of uncertain etiology         Atypical chest pain Atypical in nature, no further workup needed  Coronary calcification Calcium score of 3, minimally elevated Additional CT scan images reviewed in the office today, no aortic atherosclerosis  Hyperlipidemia Tolerating Crestor 5 daily, scheduled for lab work  Smoking We have encouraged him to continue to work on weaning his cigarettes and smoking cessation. He will continue to work on this and  does not want any assistance with chantix.    GERD On Nexium, as well as Pepcid for breakthrough    Total encounter time more than 30 minutes  Greater than 50% was spent in counseling and coordination of care with the patient   Signed, Ida Rogue, [place] [address], [place], [place] [address]

## 2022-02-14 ENCOUNTER — Ambulatory Visit: Payer: 59 | Attending: Cardiovascular Disease | Admitting: Cardiovascular Disease

## 2022-02-14 ENCOUNTER — Encounter: Payer: Self-pay | Admitting: Cardiovascular Disease

## 2022-02-14 ENCOUNTER — Other Ambulatory Visit: Payer: Self-pay | Admitting: Cardiovascular Disease

## 2022-02-14 VITALS — BP 110/80 | HR 69 | Ht 74.0 in | Wt 161.0 lb

## 2022-02-14 DIAGNOSIS — I2584 Coronary atherosclerosis due to calcified coronary lesion: Secondary | ICD-10-CM

## 2022-02-14 DIAGNOSIS — E782 Mixed hyperlipidemia: Secondary | ICD-10-CM | POA: Diagnosis not present

## 2022-02-14 DIAGNOSIS — R079 Chest pain, unspecified: Secondary | ICD-10-CM | POA: Diagnosis not present

## 2022-02-14 DIAGNOSIS — E119 Type 2 diabetes mellitus without complications: Secondary | ICD-10-CM | POA: Diagnosis not present

## 2022-02-14 DIAGNOSIS — I2510 Atherosclerotic heart disease of native coronary artery without angina pectoris: Secondary | ICD-10-CM | POA: Diagnosis not present

## 2022-02-14 MED ORDER — ROSUVASTATIN CALCIUM 5 MG PO TABS: 5.0000 mg | ORAL_TABLET | Freq: Every day | ORAL | 3 refills | Status: DC

## 2022-02-14 MED ORDER — EZETIMIBE 10 MG PO TABS: 10.0000 mg | ORAL_TABLET | Freq: Every day | ORAL | 3 refills | Status: DC

## 2022-02-14 MED ORDER — METOPROLOL SUCCINATE ER 25 MG PO TB24: 25.0000 mg | ORAL_TABLET | Freq: Every day | ORAL | 3 refills | Status: DC

## 2022-02-14 NOTE — Patient Instructions (Signed)
Medication Instructions:  No changes  If you need a refill on your cardiac medications before your next appointment, please call your pharmacy.   Lab work: No new labs needed  Testing/Procedures: No new testing needed  Follow-Up: At [facility], you and your health needs are our priority.  As part of our continuing mission to provide you with exceptional heart care, we have created designated Provider Care Teams.  These Care Teams include your primary Cardiologist (physician) and Advanced Practice Providers (APPs -  Physician Assistants and Nurse Practitioners) who all work together to provide you with the care you need, when you need it.  You will need a follow up appointment in 12 months  Providers on your designated Care Team:   Christopher Berge, NP Ryan Dunn, PA-C Cadence Furth, PA-C  COVID-19 Vaccine Information can be found at: [email or web address] For questions related to vaccine distribution or appointments, please email [email or web address] or call [phone].   

## 2022-02-22 ENCOUNTER — Other Ambulatory Visit: Payer: Self-pay

## 2022-02-22 DIAGNOSIS — R768 Other specified abnormal immunological findings in serum: Secondary | ICD-10-CM

## 2022-02-23 ENCOUNTER — Inpatient Hospital Stay: Payer: 59 | Attending: Oncology

## 2022-02-23 ENCOUNTER — Telehealth: Payer: Self-pay | Admitting: Family

## 2022-02-23 ENCOUNTER — Inpatient Hospital Stay: Payer: 59 | Admitting: Oncology

## 2022-02-23 NOTE — Telephone Encounter (Signed)
Pt called in staying that Dr. Vidal Schwalbe has referred him to Rheumatology in [place], and as per pt, they wont get him in until April. He would like to know if that referral can be switch over to a provider here in [place]???

## 2022-02-25 ENCOUNTER — Other Ambulatory Visit: Payer: Self-pay

## 2022-02-25 DIAGNOSIS — R768 Other specified abnormal immunological findings in serum: Secondary | ICD-10-CM

## 2022-02-25 NOTE — Telephone Encounter (Signed)
Referral placed to [facility] Rheum LM to advise pt.

## 2022-02-28 ENCOUNTER — Encounter: Payer: Self-pay | Admitting: Family

## 2022-03-01 ENCOUNTER — Other Ambulatory Visit (INDEPENDENT_AMBULATORY_CARE_PROVIDER_SITE_OTHER): Payer: 59

## 2022-03-01 DIAGNOSIS — E119 Type 2 diabetes mellitus without complications: Secondary | ICD-10-CM

## 2022-03-01 DIAGNOSIS — E782 Mixed hyperlipidemia: Secondary | ICD-10-CM

## 2022-03-01 LAB — HEPATIC FUNCTION PANEL
ALT: 18 U/L (ref 0–53)
AST: 22 U/L (ref 0–37)
Albumin: 4 g/dL (ref 3.5–5.2)
Alkaline Phosphatase: 93 U/L (ref 39–117)
Bilirubin, Direct: 0.1 mg/dL (ref 0.0–0.3)
Total Bilirubin: 0.3 mg/dL (ref 0.2–1.2)
Total Protein: 6.4 g/dL (ref 6.0–8.3)

## 2022-03-01 LAB — CBC WITH DIFFERENTIAL/PLATELET
Basophils Absolute: 0 10*3/uL (ref 0.0–0.1)
Basophils Relative: 0.2 % (ref 0.0–3.0)
Eosinophils Absolute: 0.2 10*3/uL (ref 0.0–0.7)
Eosinophils Relative: 1.2 % (ref 0.0–5.0)
HCT: 43.1 % (ref 39.0–52.0)
Hemoglobin: 14.3 g/dL (ref 13.0–17.0)
Lymphocytes Relative: 19 % (ref 12.0–46.0)
Lymphs Abs: 2.6 10*3/uL (ref 0.7–4.0)
MCHC: 33.3 g/dL (ref 30.0–36.0)
MCV: 90.4 fl (ref 78.0–100.0)
Monocytes Absolute: 0.8 10*3/uL (ref 0.1–1.0)
Monocytes Relative: 5.5 % (ref 3.0–12.0)
Neutro Abs: 10.2 10*3/uL — ABNORMAL HIGH (ref 1.4–7.7)
Neutrophils Relative %: 74.1 % (ref 43.0–77.0)
Platelets: 235 10*3/uL (ref 150.0–400.0)
RBC: 4.76 Mil/uL (ref 4.22–5.81)
RDW: 14.7 % (ref 11.5–15.5)
WBC: 13.8 10*3/uL — ABNORMAL HIGH (ref 4.0–10.5)

## 2022-03-01 LAB — LIPID PANEL
Cholesterol: 120 mg/dL (ref 0–200)
HDL: 36.4 mg/dL — ABNORMAL LOW (ref >39.00)
LDL Cholesterol: 54 mg/dL (ref 0–99)
NonHDL: 83.44
Total CHOL/HDL Ratio: 3
Triglycerides: 147 mg/dL (ref 0.0–149.0)
VLDL: 29.4 mg/dL (ref 0.0–40.0)

## 2022-03-01 LAB — HEMOGLOBIN A1C: Hgb A1c MFr Bld: 6.5 % (ref 4.6–6.5)

## 2022-03-09 ENCOUNTER — Telehealth: Payer: Self-pay

## 2022-03-09 NOTE — Progress Notes (Signed)
Patient called back and said he had already seen the result message through mychart. He also asked about the referral to the rheumatologist at [facility]. I told him it shows we sent it over a little over a week ago and to call them and see if he can get scheduled

## 2022-03-09 NOTE — Telephone Encounter (Signed)
LVM to call back to go over results

## 2022-03-09 NOTE — Telephone Encounter (Signed)
NOTED

## 2022-03-09 NOTE — Telephone Encounter (Signed)
Patient called back and said he had already seen the result message through mychart. He also asked about the referral to the rheumatologist at [facility]. I told him it shows we sent it over a little over a week ago and to call them and see if he can get scheduled

## 2022-03-28 NOTE — Telephone Encounter (Signed)
Spoke to pt and informed him that the referral to rheumatology was placed on 02/25/22? Pt stated that he has called and they said that there was no referral placed. Told pt I would call and check and see what happened and call him back

## 2022-03-29 NOTE — Telephone Encounter (Signed)
Thank you Rasheedah.  

## 2022-03-30 NOTE — Telephone Encounter (Signed)
Noted Thanks Rasheedah! 

## 2022-03-31 ENCOUNTER — Encounter: Payer: Self-pay | Admitting: Family

## 2022-04-04 ENCOUNTER — Other Ambulatory Visit: Payer: Self-pay | Admitting: Family

## 2022-04-04 ENCOUNTER — Ambulatory Visit: Payer: 59 | Admitting: Family

## 2022-04-04 DIAGNOSIS — Z716 Tobacco abuse counseling: Secondary | ICD-10-CM

## 2022-04-04 MED ORDER — NICOTINE 14 MG/24HR TD PT24: 14.0000 mg | MEDICATED_PATCH | Freq: Every day | TRANSDERMAL | 2 refills | Status: DC

## 2022-04-04 NOTE — Progress Notes (Signed)
close

## 2022-04-05 ENCOUNTER — Encounter: Payer: Self-pay | Admitting: Family

## 2022-04-06 ENCOUNTER — Encounter: Payer: Self-pay | Admitting: Nurse Practitioner

## 2022-04-06 ENCOUNTER — Ambulatory Visit: Payer: 59 | Admitting: Family

## 2022-04-06 ENCOUNTER — Ambulatory Visit: Payer: 59 | Admitting: Nurse Practitioner

## 2022-04-06 ENCOUNTER — Telehealth: Payer: 59 | Admitting: Nurse Practitioner

## 2022-04-06 VITALS — Ht 74.0 in | Wt 161.0 lb

## 2022-04-06 DIAGNOSIS — J989 Respiratory disorder, unspecified: Secondary | ICD-10-CM

## 2022-04-06 MED ORDER — METHYLPREDNISOLONE 4 MG PO TBPK: ORAL_TABLET | ORAL | 0 refills | Status: DC

## 2022-04-06 MED ORDER — AZITHROMYCIN 250 MG PO TABS: ORAL_TABLET | ORAL | 0 refills | Status: AC

## 2022-04-06 NOTE — Assessment & Plan Note (Addendum)
Will treat with Zpak and MDP. Patient declined cough medication as he has some at home. Encouraged adequate fluid intake. Advised to seek in person care if symptoms are not improving or are worsening.

## 2022-04-06 NOTE — Progress Notes (Signed)
MyChart Video Visit    Virtual Visit via Video Note   This visit type was conducted because this format is felt to be most appropriate for this patient at this time. Physical exam was limited by quality of the video and audio technology used for the visit. CMA was able to get the patient set up on a video visit.  Patient location: Passenger in car, still in [place]. Patient and provider in visit Provider location: Office  I discussed the limitations of evaluation and management by telemedicine and the availability of in person appointments. The patient expressed understanding and agreed to proceed.  Visit Date: 04/06/2022  Today's healthcare provider: Tomasita Morrow, NP     Subjective:    Patient ID: Philip Stephens, male    DOB: 1975-12-22, 47 y.o.   MRN: [id number]  Chief Complaint  Patient presents with   Sinus Problem    Started Sunday night, scratch throat, congestion, coughing up mucous.    HPI Patient reports gradually worsening symptoms x 4 days.  Respiratory illness:  Cough- Yes, productive  Congestion-    Sinus- Yes, nasal   Chest- Yes  Post nasal drip- Yes  Sore throat- Yes  Shortness of breath- No  Fever- No  Fatigue/Myalgia- Yes Headache- Yes Nausea/Vomiting- No Taste disturbance- No  Smell disturbance- No  Covid exposure- No  Covid vaccination- Never  Flu vaccination- Declined  Medications- Mucinex   Past Medical History:  Diagnosis Date   Arthritis    Hypertension     No past surgical history on file.  Family History  Problem Relation Age of Onset   Anxiety disorder Mother    Hyperlipidemia Father    Hearing loss Father    Diabetes Father    Arthritis Father    Cancer Maternal Grandfather    Coronary artery disease Maternal Grandfather 44    Social History   Socioeconomic History   Marital status: Married    Spouse name: Not on file   Number of children: Not on file   Years of education: Not on file   Highest  education level: Not on file  Occupational History   Not on file  Tobacco Use   Smoking status: Every Day    Packs/day: 0.50    Years: 28.00    Additional pack years: 0.00    Total pack years: 14.00    Types: Cigarettes    Last attempt to quit: 02/19/2018    Years since quitting: 4.1   Smokeless tobacco: Former  Scientific laboratory technician Use: Not on file  Substance and Sexual Activity   Alcohol use: Not Currently   Drug use: Never   Sexual activity: Yes  Other Topics Concern   Not on file  Social History Narrative   Works at [employer].    2 boys- 7 and 15   1 daughter 64   Social Determinants of Radio broadcast assistant Strain: Not on file  Food Insecurity: Not on file  Transportation Needs: Not on file  Physical Activity: Not on file  Stress: Not on file  Social Connections: Not on file  Intimate Partner Violence: Not on file    Outpatient Medications Prior to Visit  Medication Sig Dispense Refill   Aspirin-Salicylamide-Caffeine (ARTHRITIS STRENGTH BC POWDER PO) Take by mouth daily as needed.     Cholecalciferol 25 MCG (1000 UT) capsule Take by mouth.     esomeprazole (NEXIUM) 20 MG capsule Take 1 capsule (20 mg total) by  mouth 2 (two) times daily before a meal. 120 capsule 1   ezetimibe (ZETIA) 10 MG tablet Take 1 tablet (10 mg total) by mouth daily. 90 tablet 3   metoprolol succinate (TOPROL-XL) 25 MG 24 hr tablet Take 1 tablet (25 mg total) by mouth daily. 90 tablet 3   Multiple Vitamin (MULTI-VITAMIN) tablet Take 1 tablet by mouth daily.     pregabalin (LYRICA) 150 MG capsule TAKE ONE CAPSULE BY MOUTH THREE TIMES A DAY AND TAKE 1 ADDITIONAL CAPSULE IF NEEDED FOR PAIN 120 capsule 2   rosuvastatin (CRESTOR) 5 MG tablet Take 1 tablet (5 mg total) by mouth daily. 90 tablet 3   traMADol (ULTRAM) 50 MG tablet TAKE TWO TABLETS BY MOUTH EVERY 12 HOURS PRN.  MAY TAKE ONE EXTRA TABLET FOR BREAKTHROUGH PAIN 150 tablet 2   cyclobenzaprine (FLEXERIL) 10 MG tablet Take 1 tablet (10 mg  total) by mouth 3 (three) times daily. (Patient not taking: Reported on 04/06/2022) 30 tablet 0   nicotine (NICODERM CQ - DOSED IN MG/24 HOURS) 14 mg/24hr patch Place 1 patch (14 mg total) onto the skin daily. (Patient not taking: Reported on 04/06/2022) 28 patch 2   No facility-administered medications prior to visit.    Allergies  Allergen Reactions   Penicillins Swelling    ROS See HPI    Objective:    Physical Exam  Ht 6\' 2"  (1.88 m)   Wt 161 lb (73 kg)   BMI 20.67 kg/m  Wt Readings from Last 3 Encounters:  04/06/22 161 lb (73 kg)  02/14/22 161 lb (73 kg)  12/30/21 171 lb (77.6 kg)   GENERAL: alert, oriented, appears well and in no acute distress   HEENT: atraumatic, conjunttiva clear, no obvious abnormalities on inspection of external nose and ears   NECK: normal movements of the head and neck   LUNGS: on inspection no signs of respiratory distress, breathing rate appears normal, no obvious gross SOB, gasping or wheezing   CV: no obvious cyanosis   MS: moves all visible extremities without noticeable abnormality   PSYCH/NEURO: pleasant and cooperative, no obvious depression or anxiety, speech and thought processing grossly intact     Assessment & Plan:   Problem List Items Addressed This Visit       Respiratory   Respiratory illness - Primary    Will treat with Zpak and MDP. Patient declined cough medication as he has some at home. Encouraged adequate fluid intake. Advised to seek in person care if symptoms are not improving or are worsening.       Relevant Medications   azithromycin (ZITHROMAX) 250 MG tablet   methylPREDNISolone (MEDROL DOSEPAK) 4 MG TBPK tablet    I am having Philip Stephens start on azithromycin and methylPREDNISolone. I am also having him maintain his Multi-Vitamin, Cholecalciferol, Aspirin-Salicylamide-Caffeine (ARTHRITIS STRENGTH BC POWDER PO), esomeprazole, cyclobenzaprine, pregabalin, traMADol, ezetimibe, metoprolol succinate,  rosuvastatin, and nicotine.  Meds ordered this encounter  Medications   azithromycin (ZITHROMAX) 250 MG tablet    Sig: Take 2 tablets on day 1, then 1 tablet daily on days 2 through 5    Dispense:  6 tablet    Refill:  0    Order Specific Question:   Supervising Provider    Answer:   Caryl Bis, ERIC G [[id number]]   methylPREDNISolone (MEDROL DOSEPAK) 4 MG TBPK tablet    Sig: Take as directed.    Dispense:  21 each    Refill:  0    Order Specific  Question:   Supervising Provider    Answer:   Caryl Bis, ERIC G [[id number]]    I discussed the assessment and treatment plan with the patient. The patient was provided an opportunity to ask questions and all were answered. The patient agreed with the plan and demonstrated an understanding of the instructions.   The patient was advised to call back or seek an in-person evaluation if the symptoms worsen or if the condition fails to improve as anticipated.   Tomasita Morrow, NP [place] [facility] [phone] (phone) [phone] (fax)  [place]

## 2022-04-08 ENCOUNTER — Ambulatory Visit: Payer: 59 | Admitting: Family

## 2022-04-15 ENCOUNTER — Ambulatory Visit: Payer: 59 | Admitting: Family

## 2022-04-15 VITALS — BP 118/82 | HR 85 | Temp 98.7°F | Ht 73.0 in | Wt 156.2 lb

## 2022-04-15 DIAGNOSIS — M545 Low back pain, unspecified: Secondary | ICD-10-CM | POA: Diagnosis not present

## 2022-04-15 DIAGNOSIS — M542 Cervicalgia: Secondary | ICD-10-CM

## 2022-04-15 DIAGNOSIS — G8929 Other chronic pain: Secondary | ICD-10-CM

## 2022-04-15 DIAGNOSIS — E782 Mixed hyperlipidemia: Secondary | ICD-10-CM | POA: Diagnosis not present

## 2022-04-15 DIAGNOSIS — J40 Bronchitis, not specified as acute or chronic: Secondary | ICD-10-CM | POA: Diagnosis not present

## 2022-04-15 MED ORDER — GUAIFENESIN-CODEINE 100-10 MG/5ML PO SYRP: 5.0000 mL | ORAL_SOLUTION | Freq: Every evening | ORAL | 0 refills | Status: AC | PRN

## 2022-04-15 MED ORDER — TRAMADOL HCL 50 MG PO TABS
ORAL_TABLET | ORAL | 2 refills | Status: DC
Start: 2022-04-15 — End: 2022-08-08

## 2022-04-15 MED ORDER — ESOMEPRAZOLE MAGNESIUM 20 MG PO CPDR: 20.0000 mg | DELAYED_RELEASE_CAPSULE | Freq: Two times a day (BID) | ORAL | 1 refills | Status: DC

## 2022-04-15 MED ORDER — ROSUVASTATIN CALCIUM 5 MG PO TABS: 5.0000 mg | ORAL_TABLET | Freq: Every day | ORAL | 3 refills | Status: DC

## 2022-04-15 MED ORDER — METOPROLOL SUCCINATE ER 25 MG PO TB24: 25.0000 mg | ORAL_TABLET | Freq: Every day | ORAL | 3 refills | Status: DC

## 2022-04-15 MED ORDER — EZETIMIBE 10 MG PO TABS: 10.0000 mg | ORAL_TABLET | Freq: Every day | ORAL | 3 refills | Status: DC

## 2022-04-15 MED ORDER — PREGABALIN 150 MG PO CAPS
ORAL_CAPSULE | ORAL | 2 refills | Status: DC
Start: 2022-04-15 — End: 2022-07-13

## 2022-04-15 NOTE — Assessment & Plan Note (Signed)
Lab Results  Component Value Date   LDLCALC 54 03/01/2022   Continue zetia 10mg , crestor 5mg  qd

## 2022-04-15 NOTE — Patient Instructions (Signed)
I have ordered MRI of your entire spine.  Referral to pain management  Let us know if you dont hear back within a week in regards to an appointment being scheduled.   So that you are aware, if you are [facility] MyChart user , please pay attention to your MyChart messages as you may receive a MyChart message with a phone number to call and schedule this test/appointment own your own from our referral coordinator. This is a new process so I do not want you to miss this message.  If you are not a MyChart user, you will receive a phone call.

## 2022-04-15 NOTE — Assessment & Plan Note (Addendum)
Chronic neck and low back pain.  Recently exacerbated by physical nature of work as well as lifting heavy tables.  Patient and I discussed a  leave absence from work so he can focus on his health, back pain.  Pending MRI cervical, thoracic and lumbar spine.  Pending referral placed with you.  I extensively about patient misplacing or leaving medication behind while traveling.  This is a first occurrence.  Provided one time courtesy early refill today 5 days early.  I looked up patient on [place] Controlled Substances Reporting System PMP AWARE and saw no activity that raised concern of inappropriate use. Chronic, stable.   Continue regimen compliant with Lyrica 150 mg 3 times daily, with additional 150mg  tablet prn. Max daily dose 600mg  total. Continue tramadol 50mg  qam, one tablet at lunch time, one  tablet at bedtime.  He will take additional tramadol  50mg  prn if needed once throughout the day. Maximum daily dose of tramadol is 200mg /day. There are no further dose escalations of lycria, tramadol.  Referral to pain management.

## 2022-04-15 NOTE — Progress Notes (Signed)
Assessment & Plan:  Chronic midline low back pain without sciatica Assessment & Plan: Chronic neck and low back pain.  Recently exacerbated by physical nature of work as well as lifting heavy tables.  Patient and I discussed a  leave absence from work so he can focus on his health, back pain.  Pending MRI cervical, thoracic and lumbar spine.  Pending referral placed with you.  I extensively about patient misplacing or leaving medication behind while traveling.  This is a first occurrence.  Provided one time courtesy early refill today 5 days early.  I looked up patient on  Controlled Substances Reporting System PMP AWARE and saw no activity that raised concern of inappropriate use. Chronic, stable.   Continue regimen compliant with Lyrica 150 mg 3 times daily, with additional 150mg  tablet prn. Max daily dose 600mg  total. Continue tramadol 50mg  qam, one tablet at lunch time, one  tablet at bedtime.  He will take additional tramadol  50mg  prn if needed once throughout the day. Maximum daily dose of tramadol is 200mg /day. There are no further dose escalations of lycria, tramadol.  Referral to pain management.   Orders: -     Pregabalin; TAKE ONE CAPSULE BY MOUTH THREE TIMES A DAY AND TAKE 1 ADDITIONAL CAPSULE IF NEEDED FOR PAIN  Dispense: 120 capsule; Refill: 2 -     traMADol HCl; TAKE TWO TABLETS BY MOUTH EVERY 12 HOURS PRN.  MAY TAKE ONE EXTRA TABLET FOR BREAKTHROUGH PAIN  Dispense: 150 tablet; Refill: 2 -     Ambulatory referral to Pain Clinic  Cervicalgia -     MR LUMBAR SPINE WO CONTRAST; Future -     MR CERVICAL SPINE WO CONTRAST; Future -     MR THORACIC SPINE WO CONTRAST; Future -     Ambulatory referral to Pain Clinic  Bronchitis Assessment & Plan: No obvious lung sounds on exam today.  Given patient of 7 day maximum course of Robitussin AC.  Counseled him on risk of sedation particularly as he is takes Lyrica and tramadol.   Mixed hyperlipidemia Assessment & Plan: Lab Results   Component Value Date   LDLCALC 54 03/01/2022   Continue zetia 10mg , crestor 5mg  qd   Other orders -     Esomeprazole Magnesium; Take 1 capsule (20 mg total) by mouth 2 (two) times daily before a meal.  Dispense: 120 capsule; Refill: 1 -     Ezetimibe; Take 1 tablet (10 mg total) by mouth daily.  Dispense: 90 tablet; Refill: 3 -     Rosuvastatin Calcium; Take 1 tablet (5 mg total) by mouth daily.  Dispense: 90 tablet; Refill: 3 -     Metoprolol Succinate ER; Take 1 tablet (25 mg total) by mouth daily.  Dispense: 90 tablet; Refill: 3 -     guaiFENesin-Codeine; Take 5 mLs by mouth at bedtime as needed for up to 7 days for cough.  Dispense: 35 mL; Refill: 0     Return precautions given.   Risks, benefits, and alternatives of the medications and treatment plan prescribed today were discussed, and patient expressed understanding.   Education regarding symptom management and diagnosis given to patient on AVS either electronically or printed.  Return in about 3 months (around 07/15/2022).  [name], FNP  Subjective:    Patient ID: Philip Stephens, male    DOB: [birth year], 47 y.o.   MRN: [id number]  CC: Philip Stephens is a 47 y.o. male who presents today for follow up.  HPI: Low back pain has worsened after moving two heavy long tables. He is also in a more physically demanding job. He is applying for supervisory role.   His upper arms are sore.   He is interested now in seeing pain management       Describes traveling recently and left approx 5 days of medication behind. This has not occurred before.     Requesting refill of Lyrica ( last filled 03/20/22) , tramadol ( 03/17/22).   He had a virtual visit that for URI, 9 days ago ; completed azithromycin.  He stopped methylprednisolone as he felt more irritable.  Cough is improved.  No shortness of breath.  Continues to have some clear congestion.  Tessalon Perles have been ineffective.  He is requesting codeine-based  syrup for bedtime Allergies: Penicillins Current Outpatient Medications on File Prior to Visit  Medication Sig Dispense Refill   Aspirin-Salicylamide-Caffeine (ARTHRITIS STRENGTH BC POWDER PO) Take by mouth daily as needed.     Cholecalciferol 25 MCG (1000 UT) capsule Take by mouth.     Multiple Vitamin (MULTI-VITAMIN) tablet Take 1 tablet by mouth daily.     nicotine (NICODERM CQ - DOSED IN MG/24 HOURS) 14 mg/24hr patch Place 1 patch (14 mg total) onto the skin daily. 28 patch 2   cyclobenzaprine (FLEXERIL) 10 MG tablet Take 1 tablet (10 mg total) by mouth 3 (three) times daily. (Patient not taking: Reported on 04/06/2022) 30 tablet 0   No current facility-administered medications on file prior to visit.    Review of Systems  Constitutional:  Negative for chills and fever.  HENT:  Positive for congestion.   Respiratory:  Positive for cough. Negative for shortness of breath.   Cardiovascular:  Negative for chest pain and palpitations.  Gastrointestinal:  Negative for nausea and vomiting.  Musculoskeletal:  Positive for back pain and neck pain.      Objective:    BP 118/82   Pulse 85   Temp 98.7 F (37.1 C) (Oral)   Ht  (1.854 m)   Wt 156 lb 3.2 oz (70.9 kg)   SpO2 98%   BMI 20.61 kg/m  BP Readings from Last 3 Encounters:  04/15/22 118/82  02/14/22 110/80  08/23/21 113/77   Wt Readings from Last 3 Encounters:  04/15/22 156 lb 3.2 oz (70.9 kg)  04/06/22 161 lb (73 kg)  02/14/22 161 lb (73 kg)    Physical Exam Vitals reviewed.  Constitutional:      Appearance: He is well-developed.  Cardiovascular:     Rate and Rhythm: Regular rhythm.     Heart sounds: Normal heart sounds.  Pulmonary:     Effort: Pulmonary effort is normal. No respiratory distress.     Breath sounds: Normal breath sounds. No wheezing, rhonchi or rales.  Skin:    General: Skin is warm and dry.  Neurological:     Mental Status: He is alert.  Psychiatric:        Speech: Speech normal.         Behavior: Behavior normal.

## 2022-04-15 NOTE — Assessment & Plan Note (Signed)
No obvious lung sounds on exam today.  Given patient of 7 day maximum course of Robitussin AC.  Counseled him on risk of sedation particularly as he is takes Lyrica and tramadol.

## 2022-04-19 ENCOUNTER — Telehealth: Payer: Self-pay | Admitting: Family

## 2022-04-19 NOTE — Telephone Encounter (Signed)
Lft pt vm to call ofc to resch appt due to MRI req is in review.thanks

## 2022-04-19 NOTE — Telephone Encounter (Signed)
Patient states he is returning a call from [employer].  I spoke with Rasheedah and transferred call to her.

## 2022-04-20 ENCOUNTER — Telehealth: Payer: Self-pay | Admitting: Family

## 2022-04-20 ENCOUNTER — Ambulatory Visit: Payer: 59

## 2022-04-20 ENCOUNTER — Ambulatory Visit: Admission: RE | Admit: 2022-04-20 | Payer: 59 | Source: Ambulatory Visit

## 2022-04-20 NOTE — Progress Notes (Signed)
Office Visit Note  Patient: Philip Stephens             Date of Birth: [birth year]           MRN: [id number]             PCP: Allegra Grana, FNP Referring: Allegra Grana, FNP Visit Date: 04/21/2022   Subjective:  New Patient (Initial Visit) (Patient states he has color change of his skin and sometimes a rash on his right hand. Patient states he has pain and stiffness in his lower back and neck. Patient states his left knee flares up sometimes.)   History of Present Illness: Philip Stephens is a 47 y.o. male here for evaluation of positive ANA associated with chronic joint pain of multiple areas. He previously saw rheumatology at [facility] clinic in 2021 for evaluation with no specific disease process identified and in fact ANA was negative on repeat testing from their office. 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 The symptomatic complaints have been coming and going for multiple years now.  Will often get exacerbation of symptoms that last for a few weeks before resolving back to baseline.  Most prominent areas are pain in his neck and upper back and lower back.  The more recent symptoms raising concern about rheumatoid process with episodes of visible hand swelling.  He is prescribed Lyrica and tramadol these medications are beneficial during his symptoms.  He was previously recommended to undergo MRI studies but not pursuing these due to high out-of-pocket cost.  Previous prednisone treatment makes him severely irritable.  He cannot tolerate taking NSAIDs regularly due to stomach irritation. He is also had a few areas of rash these breakout on the back of the right hand and has some bumps or nodules on the back of his scalp.  Was not given a definitive previous diagnosis of the rash.  He has tried making some lifestyle interventions including stopping alcohol  and a decrease in his smoking.  Currently estimates about 1/5 of pack per day previously smoked more heavily.   Activities of Daily Living:  Patient reports morning stiffness for 2 hours.   Patient Reports nocturnal pain.  Difficulty dressing/grooming: Denies Difficulty climbing stairs: Denies Difficulty getting out of chair: Denies Difficulty using hands for taps, buttons, cutlery, and/or writing: Denies  Review of Systems  Constitutional:  Negative for fatigue.  HENT:  Positive for mouth dryness. Negative for mouth sores.   Eyes:  Negative for dryness.  Respiratory:  Negative for shortness of breath.   Cardiovascular:  Negative for chest pain and palpitations.  Gastrointestinal:  Negative for blood in stool, constipation and diarrhea.  Endocrine: Negative for increased urination.  Genitourinary:  Negative for involuntary urination.  Musculoskeletal:  Positive for joint pain, joint pain, joint swelling, morning stiffness and muscle tenderness. Negative for gait problem, myalgias, muscle weakness and myalgias.  Skin:  Positive for color change and rash. Negative for hair loss and sensitivity to sunlight.  Allergic/Immunologic: Negative for susceptible to infections.  Neurological:  Negative for dizziness and headaches.  Hematological:  Negative for swollen glands.  Psychiatric/Behavioral:  Negative for depressed mood and sleep disturbance. The patient is not nervous/anxious.     PMFS History:  Patient Active Problem List   Diagnosis Date Noted   Skin lesion 05/04/2022   Respiratory illness 04/06/2022   Acute non-recurrent sinusitis 12/30/2021  Bronchitis 10/20/2021   Leukocytosis 09/22/2021   No-show for appointment 12/23/2020   Rash 11/03/2020   Diabetes mellitus without complication (HCC) 09/09/2020   HLD (hyperlipidemia) 09/09/2020   LLQ abdominal pain 09/08/2020   Abdominal bloating 06/17/2020   COVID-19 12/20/2019   Screening for colon cancer 12/20/2019   ANA  positive 06/24/2019   Arthralgia 06/24/2019   Neck pain, musculoskeletal 06/24/2019   Localized swelling of right lower leg 05/28/2019   Right knee pain 05/28/2019   Hand swelling 05/17/2019   Chronic neck pain 04/02/2019   Tinnitus 04/02/2019   Coronary artery calcification 11/14/2018   Epigastric pain 08/03/2018   Dysuria 07/27/2018   Right-sided chest pain 07/27/2018   Impotence 05/25/2018   Numbness in both hands 04/18/2018   Fatigue 03/14/2018   Anxiety 01/15/2018   Gastroesophageal reflux disease 01/15/2018   Chronic midline low back pain without sciatica 01/01/2018    Past Medical History:  Diagnosis Date   Arthritis    Hypertension     Family History  Problem Relation Age of Onset   Anxiety disorder Mother    Hyperlipidemia Father    Hearing loss Father    Diabetes Father    Arthritis Father    Cancer Maternal Grandfather    Coronary artery disease Maternal Grandfather 52   History reviewed. No pertinent surgical history. Social History   Social History Narrative   Works at [employer].    2 boys- 7 and 15   1 daughter 12   Immunization History  Administered Date(s) Administered   Influenza-Unspecified 08/06/2018     Objective: Vital Signs: BP 110/74 (BP Location: Right Arm, Patient Position: Sitting, Cuff Size: Normal)   Pulse 76   Resp 14   Ht 6\' 1"  (1.854 m)   Wt 158 lb (71.7 kg)   BMI 20.85 kg/m    Physical Exam Eyes:     Conjunctiva/sclera: Conjunctivae normal.  Cardiovascular:     Rate and Rhythm: Normal rate and regular rhythm.  Pulmonary:     Effort: Pulmonary effort is normal.     Breath sounds: Normal breath sounds.  Musculoskeletal:     Right lower leg: No edema.     Left lower leg: No edema.  Lymphadenopathy:     Cervical: No cervical adenopathy.  Skin:    General: Skin is warm and dry.     Findings: No rash.  Neurological:     Mental Status: He is alert.  Psychiatric:        Mood and Affect: Mood normal.       Musculoskeletal Exam:  Shoulders full ROM no tenderness or swelling Elbows full ROM no tenderness or swelling Wrists full ROM no tenderness or swelling Fingers full ROM no tenderness or swelling There is some midline and bilateral paraspinal low back tenderness to pressure no radiation Knees full ROM no tenderness or swelling  Investigation: No additional findings.  Imaging: No results found.  Recent Labs: Lab Results  Component Value Date   WBC 13.8 (H) 03/01/2022   HGB 14.3 03/01/2022   PLT 235.0 03/01/2022   NA 141 07/29/2021   K 4.1 07/29/2021   CL 101 07/29/2021   CO2 27 07/29/2021   GLUCOSE 71 07/29/2021   BUN 12 07/29/2021   CREATININE 0.98 07/29/2021   BILITOT 0.3 03/01/2022   ALKPHOS 93 03/01/2022   AST 22 03/01/2022   ALT 18 03/01/2022   PROT 6.4 03/01/2022   ALBUMIN 4.0 03/01/2022   CALCIUM 9.1 07/29/2021   GFRAA >60  11/26/2017    Speciality Comments: No specialty comments available.  Procedures:  No procedures performed Allergies: Penicillins   Assessment / Plan:     Visit Diagnoses: Chronic midline low back pain without sciatica - Plan: Sedimentation rate, C-reactive protein, HLA-B27 antigen, C3 and C4, ANA  Back pain multiple areas not particularly suggestive for inflammatory pain by the history.  Checking sed rate CRP HLA-B27 and complements for evidence of inflammatory disease.  If there is significant evidence of inflammatory process from labs today could consider trial of sulfasalazine as an initial treatment choice.  Recommend if workup is benign we may have to try and see him back if or when symptoms worsen again.  ANA positive - Plan: Sedimentation rate, C-reactive protein, HLA-B27 antigen, C3 and C4, ANA  He has had a previous positive ANA but not always consistent we will recheck this today.  I do not see any definite peripheral joint synovitis or active skin inflammation at this time.  Rash Hand swelling  The previously reported rash  and hand swelling are currently doing better so not sure about the underlying process at this time.   Orders: Orders Placed This Encounter  Procedures   Sedimentation rate   C-reactive protein   HLA-B27 antigen   C3 and C4   ANA   No orders of the defined types were placed in this encounter.   Follow-Up Instructions: Return if symptoms worsen or fail to improve.   Fuller Plan, MD  Note - This record has been created using [employer].  Chart creation errors have been sought, but may not always  have been located. Such creation errors do not reflect on  the standard of medical care.

## 2022-04-20 NOTE — Telephone Encounter (Signed)
Patient dropped off document FMLA, to be filled out by provider. Patient requested to send it via Call Patient to pick up within 5-days. Document is located in providers tray at front office.Please advise at Mobile [phone] (mobile)

## 2022-04-20 NOTE — Telephone Encounter (Signed)
Paperwork placed in provider's folder.

## 2022-04-21 ENCOUNTER — Ambulatory Visit: Payer: 59 | Attending: Internal Medicine | Admitting: Internal Medicine

## 2022-04-21 ENCOUNTER — Encounter: Payer: Self-pay | Admitting: Internal Medicine

## 2022-04-21 VITALS — BP 110/74 | HR 76 | Resp 14 | Ht 73.0 in | Wt 158.0 lb

## 2022-04-21 DIAGNOSIS — M7989 Other specified soft tissue disorders: Secondary | ICD-10-CM

## 2022-04-21 DIAGNOSIS — M545 Low back pain, unspecified: Secondary | ICD-10-CM

## 2022-04-21 DIAGNOSIS — R768 Other specified abnormal immunological findings in serum: Secondary | ICD-10-CM | POA: Diagnosis not present

## 2022-04-21 DIAGNOSIS — R21 Rash and other nonspecific skin eruption: Secondary | ICD-10-CM | POA: Diagnosis not present

## 2022-04-21 DIAGNOSIS — G8929 Other chronic pain: Secondary | ICD-10-CM

## 2022-04-22 ENCOUNTER — Ambulatory Visit: Payer: 59 | Admitting: Family

## 2022-04-22 ENCOUNTER — Telehealth: Payer: Self-pay | Admitting: Family

## 2022-04-22 ENCOUNTER — Other Ambulatory Visit: Payer: Self-pay | Admitting: Family

## 2022-04-22 NOTE — Telephone Encounter (Signed)
Lft pt vm to call ofc . thanks 

## 2022-04-22 NOTE — Telephone Encounter (Signed)
Pt returned Philip Stephens call. Unable to transfer. Pt available -[phone]

## 2022-04-23 LAB — C3 AND C4
C3 Complement: 140 mg/dL (ref 82–185)
C4 Complement: 24 mg/dL (ref 15–53)

## 2022-04-23 LAB — ANA: Anti Nuclear Antibody (ANA): NEGATIVE

## 2022-04-23 LAB — C-REACTIVE PROTEIN: CRP: 6.7 mg/L (ref <8.0)

## 2022-04-23 LAB — SEDIMENTATION RATE: Sed Rate: 14 mm/h (ref 0–15)

## 2022-04-23 LAB — HLA-B27 ANTIGEN: HLA-B27 Antigen: NEGATIVE

## 2022-04-24 NOTE — Telephone Encounter (Signed)
Pt was seen on 04/22/2022, was not refilled at visit. Okay to refill?

## 2022-04-25 DIAGNOSIS — Z0279 Encounter for issue of other medical certificate: Secondary | ICD-10-CM

## 2022-04-25 MED ORDER — CYCLOBENZAPRINE HCL 10 MG PO TABS: 10.0000 mg | ORAL_TABLET | Freq: Three times a day (TID) | ORAL | 2 refills | Status: DC | PRN

## 2022-04-25 NOTE — Telephone Encounter (Signed)
Spoke to pt in regards to [facility] paperwork

## 2022-04-25 NOTE — Progress Notes (Signed)
Lab results all came back normal. I recommend monitoring symptoms for right now and we can follow up if/when he has a flare up to take another look.

## 2022-04-25 NOTE — Telephone Encounter (Signed)
Patient states he is returning our call regarding his FMLA paperwork.

## 2022-04-26 ENCOUNTER — Encounter: Payer: Self-pay | Admitting: Family

## 2022-04-30 NOTE — Progress Notes (Unsigned)
   Assessment & Plan:  There are no diagnoses linked to this encounter.   Return precautions given.   Risks, benefits, and alternatives of the medications and treatment plan prescribed today were discussed, and patient expressed understanding.   Education regarding symptom management and diagnosis given to patient on AVS either electronically or printed.  No follow-ups on file.  [name], FNP  Subjective:    Patient ID: Philip Stephens, male    DOB: 1975/01/14, 71 y.o.   MRN: [id number]  CC: Philip Stephens is a 47 y.o. male who presents today for follow up.   HPI: I spoke with pt he states he has done 6 weeks of PT treatment with Dr Aldona Lento at [facility] for workman's comp started on 06/25/21 3x a week. Pt has had MRI with PT he has it on a disk, pt not sure which ones.  Medication is flexrill treatment has not been 6 weeks yet. Please call pt at [phone]      Allergies: Penicillins Current Outpatient Medications on File Prior to Visit  Medication Sig Dispense Refill   acetaminophen (TYLENOL) 650 MG CR tablet Take 650 mg by mouth every 8 (eight) hours as needed for pain.     Aspirin-Salicylamide-Caffeine (ARTHRITIS STRENGTH BC POWDER PO) Take by mouth daily as needed.     Cholecalciferol 25 MCG (1000 UT) capsule Take by mouth.     cyclobenzaprine (FLEXERIL) 10 MG tablet Take 1 tablet (10 mg total) by mouth 3 (three) times daily as needed for muscle spasms. 30 tablet 2   esomeprazole (NEXIUM) 20 MG capsule Take 1 capsule (20 mg total) by mouth 2 (two) times daily before a meal. 120 capsule 1   ezetimibe (ZETIA) 10 MG tablet Take 1 tablet (10 mg total) by mouth daily. 90 tablet 3   metoprolol succinate (TOPROL-XL) 25 MG 24 hr tablet Take 1 tablet (25 mg total) by mouth daily. 90 tablet 3   Multiple Vitamin (MULTI-VITAMIN) tablet Take 1 tablet by mouth daily.     nicotine (NICODERM CQ - DOSED IN MG/24 HOURS) 14 mg/24hr patch Place 1 patch (14 mg total) onto the skin  daily. (Patient not taking: Reported on 04/21/2022) 28 patch 2   pregabalin (LYRICA) 150 MG capsule TAKE ONE CAPSULE BY MOUTH THREE TIMES A DAY AND TAKE 1 ADDITIONAL CAPSULE IF NEEDED FOR PAIN 120 capsule 2   rosuvastatin (CRESTOR) 5 MG tablet Take 1 tablet (5 mg total) by mouth daily. 90 tablet 3   traMADol (ULTRAM) 50 MG tablet TAKE TWO TABLETS BY MOUTH EVERY 12 HOURS PRN.  MAY TAKE ONE EXTRA TABLET FOR BREAKTHROUGH PAIN 150 tablet 2   No current facility-administered medications on file prior to visit.    Review of Systems    Objective:    There were no vitals taken for this visit. BP Readings from Last 3 Encounters:  04/21/22 110/74  04/15/22 118/82  02/14/22 110/80   Wt Readings from Last 3 Encounters:  04/21/22 158 lb (71.7 kg)  04/15/22 156 lb 3.2 oz (70.9 kg)  04/06/22 161 lb (73 kg)    Physical Exam

## 2022-05-04 ENCOUNTER — Encounter: Payer: Self-pay | Admitting: Family

## 2022-05-04 ENCOUNTER — Ambulatory Visit (INDEPENDENT_AMBULATORY_CARE_PROVIDER_SITE_OTHER): Payer: 59 | Admitting: Family

## 2022-05-04 VITALS — BP 116/80 | HR 71 | Temp 97.6°F | Ht 73.0 in | Wt 161.2 lb

## 2022-05-04 DIAGNOSIS — G8929 Other chronic pain: Secondary | ICD-10-CM

## 2022-05-04 DIAGNOSIS — L989 Disorder of the skin and subcutaneous tissue, unspecified: Secondary | ICD-10-CM | POA: Diagnosis not present

## 2022-05-04 DIAGNOSIS — R768 Other specified abnormal immunological findings in serum: Secondary | ICD-10-CM

## 2022-05-04 DIAGNOSIS — M545 Low back pain, unspecified: Secondary | ICD-10-CM | POA: Diagnosis not present

## 2022-05-04 MED ORDER — TIZANIDINE HCL 2 MG PO CAPS
2.0000 mg | ORAL_CAPSULE | Freq: Every day | ORAL | 1 refills | Status: DC | PRN
Start: 2022-05-04 — End: 2022-10-11

## 2022-05-04 NOTE — Patient Instructions (Signed)
Start Zanaflex 2 mg to trial and see if less sedating.  This is also muscle relaxant.  You may not take Zanaflex at the same time as Flexeril as Flexeril is also muscle relaxant. Do not drive or operate heavy machinery while on muscle relaxant. Please do not drink alcohol. Only take this medication as needed for acute muscle spasm at bedtime. This medication make you feel drowsy so be very careful.  Stop taking if become too drowsy or somnolent as this puts you at risk for falls. Please contact our office with any questions.    Referral dermatology for evaluation of left arm skin lesion.  Let us know if you dont hear back within a week in regards to an appointment being scheduled.   So that you are aware, if you are [facility] MyChart user , please pay attention to your MyChart messages as you may receive a MyChart message with a phone number to call and schedule this test/appointment own your own from our referral coordinator. This is a new process so I do not want you to miss this message.  If you are not a MyChart user, you will receive a phone call.

## 2022-05-04 NOTE — Assessment & Plan Note (Signed)
Improved overall.  No radicular symptoms.Continue regimen compliant with Lyrica 150 mg 3 times daily, with additional 150mg  tablet prn. Max daily dose 600mg  total. Continue tramadol 50mg  qam, one tablet at lunch time, one  tablet at bedtime.  He will take additional tramadol  50mg  prn if needed once throughout the day. Maximum daily dose of tramadol is 200mg /day.  He is taking Flexeril 5 mg nightly.  Provided prescription for Zanaflex 2 mg to trial during the daytime to see if less sedating.

## 2022-05-04 NOTE — Assessment & Plan Note (Addendum)
Pruritic  lesion in sun exposed area left forearm.  Concern for SCC.  Reiterated the importance of evaluation dermatology.

## 2022-05-18 ENCOUNTER — Telehealth: Payer: Self-pay | Admitting: Family

## 2022-05-18 NOTE — Telephone Encounter (Signed)
Patient wanted to know if his paperwork has been fax to through to [facility]. [facility] [employer] has not received anything from Arnett's office.

## 2022-05-18 NOTE — Telephone Encounter (Signed)
Responded to pt via my chart that paperwork has been faxed and we received an ok from [facility] that they received it and his return date back to work is the 26th of May.

## 2022-05-24 ENCOUNTER — Telehealth: Payer: Self-pay

## 2022-05-24 ENCOUNTER — Ambulatory Visit (INDEPENDENT_AMBULATORY_CARE_PROVIDER_SITE_OTHER): Payer: 59

## 2022-05-24 ENCOUNTER — Encounter: Payer: Self-pay | Admitting: Family

## 2022-05-24 ENCOUNTER — Ambulatory Visit (INDEPENDENT_AMBULATORY_CARE_PROVIDER_SITE_OTHER): Payer: 59 | Admitting: Family

## 2022-05-24 VITALS — BP 118/82 | HR 78 | Temp 97.7°F | Ht 73.0 in | Wt 161.8 lb

## 2022-05-24 DIAGNOSIS — G8929 Other chronic pain: Secondary | ICD-10-CM

## 2022-05-24 DIAGNOSIS — M545 Low back pain, unspecified: Secondary | ICD-10-CM

## 2022-05-24 NOTE — Assessment & Plan Note (Signed)
Previously  LBP had been improving.  Unfortunately acutely worsened picking up  bucket.  Improving with  Flexeril.  Insurance would not pay MRI lumbar spine.  Pending lumbar x-ray to evaluate for progression of stenosis. Continue regimen compliant with Lyrica 150 mg 3 times daily, with additional 150mg  tablet prn. Max daily dose 600mg  total. Continue tramadol 50mg  qam, one tablet at lunch time, one  tablet at bedtime.  He will take additional tramadol  50mg  prn if needed once throughout the day. Maximum daily dose of tramadol is 200mg /day. He is taking Flexeril 5 mg nightly.   Follow-up in 1 week

## 2022-05-24 NOTE — Progress Notes (Signed)
Assessment & Plan:  Chronic midline low back pain without sciatica Assessment & Plan: Previously  LBP had been improving.  Unfortunately acutely worsened picking up  bucket.  Improving with  Flexeril.  Insurance would not pay MRI lumbar spine.  Pending lumbar x-ray to evaluate for progression of stenosis. Continue regimen compliant with Lyrica 150 mg 3 times daily, with additional 150mg  tablet prn. Max daily dose 600mg  total. Continue tramadol 50mg  qam, one tablet at lunch time, one  tablet at bedtime.  He will take additional tramadol  50mg  prn if needed once throughout the day. Maximum daily dose of tramadol is 200mg /day. He is taking Flexeril 5 mg nightly.   Follow-up in 1 week  Orders: -     DG Lumbar Spine Complete; Future     Return precautions given.   Risks, benefits, and alternatives of the medications and treatment plan prescribed today were discussed, and patient expressed understanding.   Education regarding symptom management and diagnosis given to patient on AVS either electronically or printed.  Return in about 1 week (around 05/31/2022).  [name], FNP  Subjective:    Patient ID: Philip Stephens, male    DOB: April 26, 1975, 47 y.o.   MRN: [id number]  CC: Philip Stephens is a 47 y.o. male who presents today for follow up.   HPI: Follow-up low back pain.    He does complain of increased right low back pain today after picking up 5 galloon bucket. Back pain had improved until 3 days ago after injury. He has right leg anterior numbness to right great toe.  Pain is improved as has been using Flexeril, epsom salt  No bowel or urinary incontinence, weakness, saddle anesthesia.  He has not used  Zanaflex   He would like to remain out of work until follow-up insurance would not pay for an MRI  He declines screening with colonoscopy. He doesn't want to go under general anesthesia.  He would like to pursue cologuard.      Allergies: Penicillins Current  Outpatient Medications on File Prior to Visit  Medication Sig Dispense Refill   acetaminophen (TYLENOL) 650 MG CR tablet Take 650 mg by mouth every 8 (eight) hours as needed for pain.     Aspirin-Salicylamide-Caffeine (ARTHRITIS STRENGTH BC POWDER PO) Take by mouth daily as needed.     Cholecalciferol 25 MCG (1000 UT) capsule Take by mouth.     cyclobenzaprine (FLEXERIL) 10 MG tablet Take 1 tablet (10 mg total) by mouth 3 (three) times daily as needed for muscle spasms. 30 tablet 2   esomeprazole (NEXIUM) 20 MG capsule Take 1 capsule (20 mg total) by mouth 2 (two) times daily before a meal. 120 capsule 1   ezetimibe (ZETIA) 10 MG tablet Take 1 tablet (10 mg total) by mouth daily. 90 tablet 3   metoprolol succinate (TOPROL-XL) 25 MG 24 hr tablet Take 1 tablet (25 mg total) by mouth daily. 90 tablet 3   Multiple Vitamin (MULTI-VITAMIN) tablet Take 1 tablet by mouth daily.     nicotine (NICODERM CQ - DOSED IN MG/24 HOURS) 14 mg/24hr patch Place 1 patch (14 mg total) onto the skin daily. 28 patch 2   pregabalin (LYRICA) 150 MG capsule TAKE ONE CAPSULE BY MOUTH THREE TIMES A DAY AND TAKE 1 ADDITIONAL CAPSULE IF NEEDED FOR PAIN 120 capsule 2   rosuvastatin (CRESTOR) 5 MG tablet Take 1 tablet (5 mg total) by mouth daily. 90 tablet 3   tizanidine (ZANAFLEX) 2 MG capsule  Take 1 capsule (2 mg total) by mouth daily as needed for muscle spasms. Do not take with flexeril. 30 capsule 1   traMADol (ULTRAM) 50 MG tablet TAKE TWO TABLETS BY MOUTH EVERY 12 HOURS PRN.  MAY TAKE ONE EXTRA TABLET FOR BREAKTHROUGH PAIN 150 tablet 2   No current facility-administered medications on file prior to visit.    Review of Systems  Constitutional:  Negative for chills and fever.  Respiratory:  Negative for cough.   Cardiovascular:  Negative for chest pain and palpitations.  Gastrointestinal:  Negative for nausea and vomiting.  Musculoskeletal:  Positive for back pain.  Neurological:  Positive for numbness.       Objective:    BP 118/82   Pulse 78   Temp 97.7 F (36.5 C) (Oral)   Ht 6\' 1"  (1.854 m)   Wt 161 lb 12.8 oz (73.4 kg)   SpO2 99%   BMI 21.35 kg/m  BP Readings from Last 3 Encounters:  05/24/22 118/82  05/04/22 116/80  04/21/22 110/74   Wt Readings from Last 3 Encounters:  05/24/22 161 lb 12.8 oz (73.4 kg)  05/04/22 161 lb 3.2 oz (73.1 kg)  04/21/22 158 lb (71.7 kg)    Physical Exam Vitals reviewed.  Constitutional:      Appearance: He is well-developed.  Cardiovascular:     Rate and Rhythm: Regular rhythm.     Heart sounds: Normal heart sounds.  Pulmonary:     Effort: Pulmonary effort is normal. No respiratory distress.     Breath sounds: Normal breath sounds. No wheezing or rales.  Musculoskeletal:     Lumbar back: No swelling, spasms or tenderness. Normal range of motion.     Comments: Full range of motion with flexion, extension, lateral side bends. No pain, numbness, tingling elicited with single leg raise bilaterally. No rash.  Skin:    General: Skin is warm and dry.  Neurological:     Mental Status: He is alert.  Psychiatric:        Speech: Speech normal.        Behavior: Behavior normal.

## 2022-05-24 NOTE — Telephone Encounter (Signed)
At check-out today, [name], FNP, would like to see patient back in one week, but her first available is 06/03/2022.  I scheduled patient for 06/03/2022.

## 2022-05-24 NOTE — Telephone Encounter (Signed)
Noted that is fine

## 2022-05-24 NOTE — Patient Instructions (Addendum)
We have ordered cologuard. Let us know if you do not get the kit within 2 weeks time.  Low Back Sprain or Strain Rehab Ask your health care provider which exercises are safe for you. Do exercises exactly as told by your health care provider and adjust them as directed. It is normal to feel mild stretching, pulling, tightness, or discomfort as you do these exercises. Stop right away if you feel sudden pain or your pain gets worse. Do not begin these exercises until told by your health care provider. Stretching and range-of-motion exercises These exercises warm up your muscles and joints and improve the movement and flexibility of your back. These exercises also help to relieve pain, numbness, and tingling. Lumbar rotation  Lie on your back on a firm bed or the floor with your knees bent. Straighten your arms out to your sides so each arm forms a 90-degree angle (right angle) with a side of your body. Slowly move (rotate) both of your knees to one side of your body until you feel a stretch in your lower back (lumbar). Try not to let your shoulders lift off the floor. Hold this position for __________ seconds. Tense your abdominal muscles and slowly move your knees back to the starting position. Repeat this exercise on the other side of your body. Repeat __________ times. Complete this exercise __________ times a day. Single knee to chest  Lie on your back on a firm bed or the floor with both legs straight. Bend one of your knees. Use your hands to move your knee up toward your chest until you feel a gentle stretch in your lower back and buttock. Hold your leg in this position by holding on to the front of your knee. Keep your other leg as straight as possible. Hold this position for __________ seconds. Slowly return to the starting position. Repeat with your other leg. Repeat __________ times. Complete this exercise __________ times a day. Prone extension on elbows  Lie on your abdomen on a  firm bed or the floor (prone position). Prop yourself up on your elbows. Use your arms to help lift your chest up until you feel a gentle stretch in your abdomen and your lower back. This will place some of your body weight on your elbows. If this is uncomfortable, try stacking pillows under your chest. Your hips should stay down, against the surface that you are lying on. Keep your hip and back muscles relaxed. Hold this position for __________ seconds. Slowly relax your upper body and return to the starting position. Repeat __________ times. Complete this exercise __________ times a day. Strengthening exercises These exercises build strength and endurance in your back. Endurance is the ability to use your muscles for a long time, even after they get tired. Pelvic tilt This exercise strengthens the muscles that lie deep in the abdomen. Lie on your back on a firm bed or the floor with your legs extended. Bend your knees so they are pointing toward the ceiling and your feet are flat on the floor. Tighten your lower abdominal muscles to press your lower back against the floor. This motion will tilt your pelvis so your tailbone points up toward the ceiling instead of pointing to your feet or the floor. To help with this exercise, you may place a small towel under your lower back and try to push your back into the towel. Hold this position for __________ seconds. Let your muscles relax completely before you repeat this exercise. Repeat  __________ times. Complete this exercise __________ times a day. Alternating arm and leg raises  Get on your hands and knees on a firm surface. If you are on a hard floor, you may want to use padding, such as an exercise mat, to cushion your knees. Line up your arms and legs. Your hands should be directly below your shoulders, and your knees should be directly below your hips. Lift your left leg behind you. At the same time, raise your right arm and straighten it in  front of you. Do not lift your leg higher than your hip. Do not lift your arm higher than your shoulder. Keep your abdominal and back muscles tight. Keep your hips facing the ground. Do not arch your back. Keep your balance carefully, and do not hold your breath. Hold this position for __________ seconds. Slowly return to the starting position. Repeat with your right leg and your left arm. Repeat __________ times. Complete this exercise __________ times a day. Abdominal set with straight leg raise  Lie on your back on a firm bed or the floor. Bend one of your knees and keep your other leg straight. Tense your abdominal muscles and lift your straight leg up, 4-6 inches (10-15 cm) off the ground. Keep your abdominal muscles tight and hold this position for __________ seconds. Do not hold your breath. Do not arch your back. Keep it flat against the ground. Keep your abdominal muscles tense as you slowly lower your leg back to the starting position. Repeat with your other leg. Repeat __________ times. Complete this exercise __________ times a day. Single leg lower with bent knees Lie on your back on a firm bed or the floor. Tense your abdominal muscles and lift your feet off the floor, one foot at a time, so your knees and hips are bent in 90-degree angles (right angles). Your knees should be over your hips and your lower legs should be parallel to the floor. Keeping your abdominal muscles tense and your knee bent, slowly lower one of your legs so your toe touches the ground. Lift your leg back up to return to the starting position. Do not hold your breath. Do not let your back arch. Keep your back flat against the ground. Repeat with your other leg. Repeat __________ times. Complete this exercise __________ times a day. Posture and body mechanics Good posture and healthy body mechanics can help to relieve stress in your body's tissues and joints. Body mechanics refers to the movements  and positions of your body while you do your daily activities. Posture is part of body mechanics. Good posture means: Your spine is in its natural S-curve position (neutral). Your shoulders are pulled back slightly. Your head is not tipped forward (neutral). Follow these guidelines to improve your posture and body mechanics in your everyday activities. Standing  When standing, keep your spine neutral and your feet about hip-width apart. Keep a slight bend in your knees. Your ears, shoulders, and hips should line up. When you do a task in which you stand in one place for a long time, place one foot up on a stable object that is 2-4 inches (5-10 cm) high, such as a footstool. This helps keep your spine neutral. Sitting  When sitting, keep your spine neutral and keep your feet flat on the floor. Use a footrest, if necessary, and keep your thighs parallel to the floor. Avoid rounding your shoulders, and avoid tilting your head forward. When working at a desk or  a computer, keep your desk at a height where your hands are slightly lower than your elbows. Slide your chair under your desk so you are close enough to maintain good posture. When working at a computer, place your monitor at a height where you are looking straight ahead and you do not have to tilt your head forward or downward to look at the screen. Resting When lying down and resting, avoid positions that are most painful for you. If you have pain with activities such as sitting, bending, stooping, or squatting, lie in a position in which your body does not bend very much. For example, avoid curling up on your side with your arms and knees near your chest (fetal position). If you have pain with activities such as standing for a long time or reaching with your arms, lie with your spine in a neutral position and bend your knees slightly. Try the following positions: Lying on your side with a pillow between your knees. Lying on your back with a  pillow under your knees. Lifting  When lifting objects, keep your feet at least shoulder-width apart and tighten your abdominal muscles. Bend your knees and hips and keep your spine neutral. It is important to lift using the strength of your legs, not your back. Do not lock your knees straight out. Always ask for help to lift heavy or awkward objects. This information is not intended to replace advice given to you by your health care provider. Make sure you discuss any questions you have with your health care provider. Document Revised: 03/09/2020 Document Reviewed: 03/09/2020 Elsevier Patient Education  2023 [employer].

## 2022-05-26 ENCOUNTER — Other Ambulatory Visit: Payer: Self-pay

## 2022-05-26 DIAGNOSIS — Z1211 Encounter for screening for malignant neoplasm of colon: Secondary | ICD-10-CM

## 2022-06-03 ENCOUNTER — Encounter: Payer: Self-pay | Admitting: Family

## 2022-06-03 ENCOUNTER — Ambulatory Visit (INDEPENDENT_AMBULATORY_CARE_PROVIDER_SITE_OTHER): Payer: 59 | Admitting: Family

## 2022-06-03 VITALS — BP 112/70 | HR 71 | Temp 97.8°F | Ht 73.0 in | Wt 163.8 lb

## 2022-06-03 DIAGNOSIS — M5440 Lumbago with sciatica, unspecified side: Secondary | ICD-10-CM | POA: Diagnosis not present

## 2022-06-03 DIAGNOSIS — S61217A Laceration without foreign body of left little finger without damage to nail, initial encounter: Secondary | ICD-10-CM

## 2022-06-03 DIAGNOSIS — E119 Type 2 diabetes mellitus without complications: Secondary | ICD-10-CM

## 2022-06-03 DIAGNOSIS — G8929 Other chronic pain: Secondary | ICD-10-CM

## 2022-06-03 DIAGNOSIS — S61219A Laceration without foreign body of unspecified finger without damage to nail, initial encounter: Secondary | ICD-10-CM | POA: Insufficient documentation

## 2022-06-03 LAB — MICROALBUMIN / CREATININE URINE RATIO
Creatinine,U: 98.4 mg/dL
Microalb Creat Ratio: 1.4 mg/g (ref 0.0–30.0)
Microalb, Ur: 1.4 mg/dL (ref 0.0–1.9)

## 2022-06-03 LAB — POCT GLYCOSYLATED HEMOGLOBIN (HGB A1C): Hemoglobin A1C: 5.5 % (ref 4.0–5.6)

## 2022-06-03 NOTE — Assessment & Plan Note (Signed)
Chronic, stable.  No medication changes made Continue regimen compliant with Lyrica 150 mg 3 times daily, with additional 150mg  tablet prn. Max daily dose 600mg  total. Continue tramadol 50mg  qam, one tablet at lunch time, one  tablet at bedtime.  He will take additional tramadol  50mg  prn if needed once throughout the day. Maximum daily dose of tramadol is 200mg /day. He is taking Flexeril 5 mg nightly.   Work note provided to return work.

## 2022-06-03 NOTE — Assessment & Plan Note (Addendum)
Patient politely declines my evaluation of his finger.  He would like to keep bandage on.  Strongly encouraged him to have his tetanus vaccine today.  He declines.

## 2022-06-03 NOTE — Progress Notes (Signed)
Assessment & Plan:  Diabetes mellitus without complication [facility]) Assessment & Plan: Lab Results  Component Value Date   HGBA1C 5.5 06/03/2022   Excellent control without medication.  Patient will continue to control diabetes with diet.  Orders: -     Microalbumin / creatinine urine ratio -     POCT glycosylated hemoglobin (Hb A1C)  Chronic low back pain with sciatica, sciatica laterality unspecified, unspecified back pain laterality Assessment & Plan: Chronic, stable.  No medication changes made Continue regimen compliant with Lyrica 150 mg 3 times daily, with additional 150mg  tablet prn. Max daily dose 600mg  total. Continue tramadol 50mg  qam, one tablet at lunch time, one  tablet at bedtime.  He will take additional tramadol  50mg  prn if needed once throughout the day. Maximum daily dose of tramadol is 200mg /day. He is taking Flexeril 5 mg nightly.   Work note provided to return work.   Orders: -     Pain Management Screening Profile (10S)  Laceration of left little finger, foreign body presence unspecified, nail damage status unspecified, initial encounter Assessment & Plan: Patient politely declines my evaluation of his finger.  He would like to keep bandage on.  Strongly encouraged him to have his tetanus vaccine today.  He declines.      Return precautions given.   Risks, benefits, and alternatives of the medications and treatment plan prescribed today were discussed, and patient expressed understanding.   Education regarding symptom management and diagnosis given to patient on AVS either electronically or printed.  Return in about 3 months (around 09/03/2022).  [name], FNP  Subjective:    Patient ID: Philip Stephens, male    DOB: November 20, 1975, 47 y.o.   MRN: [id number]  CC: Philip Stephens is a 47 y.o. male who presents today for follow up.   HPI: Follow-up low back pain. He is ready back to work and excited that he has obtained a new job.   He is  pleased with medication regimen and like to continue He does report that yesterday he was picking up a log and he sliced his left pinky finger.  His tetanus vaccine is due.  It is bandaged today, and he does not want to remove the bandage    Lumbar X-ray 05/24/22  mild disc space narrowing at the very base of the lumbar spine at L5-S1, unchanged over the past 2 years.   Allergies: Penicillins Current Outpatient Medications on File Prior to Visit  Medication Sig Dispense Refill   acetaminophen (TYLENOL) 650 MG CR tablet Take 650 mg by mouth every 8 (eight) hours as needed for pain.     Aspirin-Salicylamide-Caffeine (ARTHRITIS STRENGTH BC POWDER PO) Take by mouth daily as needed.     Cholecalciferol 25 MCG (1000 UT) capsule Take by mouth.     cyclobenzaprine (FLEXERIL) 10 MG tablet Take 1 tablet (10 mg total) by mouth 3 (three) times daily as needed for muscle spasms. 30 tablet 2   esomeprazole (NEXIUM) 20 MG capsule Take 1 capsule (20 mg total) by mouth 2 (two) times daily before a meal. 120 capsule 1   ezetimibe (ZETIA) 10 MG tablet Take 1 tablet (10 mg total) by mouth daily. 90 tablet 3   metoprolol succinate (TOPROL-XL) 25 MG 24 hr tablet Take 1 tablet (25 mg total) by mouth daily. 90 tablet 3   Multiple Vitamin (MULTI-VITAMIN) tablet Take 1 tablet by mouth daily.     nicotine (NICODERM CQ - DOSED IN MG/24 HOURS) 14 mg/24hr  patch Place 1 patch (14 mg total) onto the skin daily. 28 patch 2   pregabalin (LYRICA) 150 MG capsule TAKE ONE CAPSULE BY MOUTH THREE TIMES A DAY AND TAKE 1 ADDITIONAL CAPSULE IF NEEDED FOR PAIN 120 capsule 2   rosuvastatin (CRESTOR) 5 MG tablet Take 1 tablet (5 mg total) by mouth daily. 90 tablet 3   tizanidine (ZANAFLEX) 2 MG capsule Take 1 capsule (2 mg total) by mouth daily as needed for muscle spasms. Do not take with flexeril. 30 capsule 1   traMADol (ULTRAM) 50 MG tablet TAKE TWO TABLETS BY MOUTH EVERY 12 HOURS PRN.  MAY TAKE ONE EXTRA TABLET FOR BREAKTHROUGH PAIN  150 tablet 2   No current facility-administered medications on file prior to visit.    Review of Systems  Constitutional:  Negative for chills and fever.  Respiratory:  Negative for cough.   Cardiovascular:  Negative for chest pain and palpitations.  Gastrointestinal:  Negative for nausea and vomiting.  Musculoskeletal:  Positive for back pain.  Skin:  Positive for wound.      Objective:    BP 112/70   Pulse 71   Temp 97.8 F (36.6 C) (Oral)   Ht 6\' 1"  (1.854 m)   Wt 163 lb 12.8 oz (74.3 kg)   SpO2 97%   BMI 21.61 kg/m  BP Readings from Last 3 Encounters:  06/03/22 112/70  05/24/22 118/82  05/04/22 116/80   Wt Readings from Last 3 Encounters:  06/03/22 163 lb 12.8 oz (74.3 kg)  05/24/22 161 lb 12.8 oz (73.4 kg)  05/04/22 161 lb 3.2 oz (73.1 kg)    Physical Exam Vitals reviewed.  Constitutional:      Appearance: He is well-developed.  Cardiovascular:     Rate and Rhythm: Regular rhythm.     Heart sounds: Normal heart sounds.  Pulmonary:     Effort: Pulmonary effort is normal. No respiratory distress.     Breath sounds: Normal breath sounds. No wheezing, rhonchi or rales.  Skin:    General: Skin is warm and dry.     Comments: Left 5th finger bandage. No blood seen.   Neurological:     Mental Status: He is alert.  Psychiatric:        Speech: Speech normal.        Behavior: Behavior normal.

## 2022-06-03 NOTE — Assessment & Plan Note (Signed)
Lab Results  Component Value Date   HGBA1C 5.5 06/03/2022   Excellent control without medication.  Patient will continue to control diabetes with diet.

## 2022-06-04 LAB — PMP SCREEN PROFILE (10S), URINE
Amphetamine Scrn, Ur: NEGATIVE ng/mL
BARBITURATE SCREEN URINE: NEGATIVE ng/mL
BENZODIAZEPINE SCREEN, URINE: NEGATIVE ng/mL
CANNABINOIDS UR QL SCN: NEGATIVE ng/mL
Cocaine (Metab) Scrn, Ur: NEGATIVE ng/mL
Creatinine(Crt), U: 98.9 mg/dL (ref 20.0–300.0)
Methadone Screen, Urine: NEGATIVE ng/mL
OXYCODONE+OXYMORPHONE UR QL SCN: NEGATIVE ng/mL
Opiate Scrn, Ur: POSITIVE ng/mL — AB
Ph of Urine: 5.6 (ref 4.5–8.9)
Phencyclidine Qn, Ur: NEGATIVE ng/mL
Propoxyphene Scrn, Ur: NEGATIVE ng/mL

## 2022-06-09 ENCOUNTER — Ambulatory Visit: Payer: 59 | Admitting: Student in an Organized Health Care Education/Training Program

## 2022-07-10 ENCOUNTER — Other Ambulatory Visit: Payer: Self-pay | Admitting: Family

## 2022-07-13 ENCOUNTER — Other Ambulatory Visit: Payer: Self-pay | Admitting: Family

## 2022-07-13 ENCOUNTER — Encounter: Payer: Self-pay | Admitting: Family

## 2022-07-13 DIAGNOSIS — G8929 Other chronic pain: Secondary | ICD-10-CM

## 2022-08-08 ENCOUNTER — Encounter: Payer: Self-pay | Admitting: Family

## 2022-08-08 ENCOUNTER — Ambulatory Visit: Payer: BC Managed Care – PPO | Admitting: Family

## 2022-08-08 VITALS — BP 116/80 | HR 84 | Temp 98.0°F | Ht 73.0 in | Wt 164.4 lb

## 2022-08-08 DIAGNOSIS — G8929 Other chronic pain: Secondary | ICD-10-CM

## 2022-08-08 DIAGNOSIS — J0100 Acute maxillary sinusitis, unspecified: Secondary | ICD-10-CM | POA: Diagnosis not present

## 2022-08-08 DIAGNOSIS — R490 Dysphonia: Secondary | ICD-10-CM | POA: Diagnosis not present

## 2022-08-08 DIAGNOSIS — M5440 Lumbago with sciatica, unspecified side: Secondary | ICD-10-CM | POA: Diagnosis not present

## 2022-08-08 DIAGNOSIS — M545 Low back pain, unspecified: Secondary | ICD-10-CM

## 2022-08-08 MED ORDER — TRAMADOL HCL 50 MG PO TABS
ORAL_TABLET | ORAL | 2 refills | Status: DC
Start: 2022-08-08 — End: 2022-11-01

## 2022-08-08 MED ORDER — FLUTICASONE PROPIONATE 50 MCG/ACT NA SUSP
2.0000 | Freq: Every day | NASAL | 6 refills | Status: DC
Start: 2022-08-08 — End: 2023-04-25

## 2022-08-08 NOTE — Assessment & Plan Note (Signed)
Chronic, stable.  No medication changes made. Continue regimen compliant with Lyrica 150 mg 3 times daily, with additional 150mg  tablet prn. Max daily dose 600mg  total. Continue tramadol 50mg  qam, one tablet at lunch time, one  tablet at bedtime.  He will take additional tramadol  50mg  prn if needed once throughout the day. Maximum daily dose of tramadol is 200mg /day. He is taking Flexeril 5 mg nightly.   Follow-up in 3 months time.

## 2022-08-08 NOTE — Assessment & Plan Note (Signed)
Episodic.  Reassuring HEENT exam in the office today.  Discussed GERD versus allergic rhinitis.  No alarm features at this time however advised if symptoms were to persist and fail to improve with the addition of Flonase, I would like to arrange gastroenterology or ENT consult for further evaluation.  Patient will let me know how he is doing.

## 2022-08-08 NOTE — Progress Notes (Addendum)
Assessment & Plan:  Acute non-recurrent maxillary sinusitis -     Fluticasone Propionate; Place 2 sprays into both nostrils daily.  Dispense: 16 g; Refill: 6  Hoarseness Assessment & Plan: Episodic.  Reassuring HEENT exam in the office today.  Discussed GERD versus allergic rhinitis.  No alarm features at this time however advised if symptoms were to persist and fail to improve with the addition of Flonase, I would like to arrange gastroenterology or ENT consult for further evaluation.  Patient will let me know how he is doing.   Chronic low back pain with sciatica, sciatica laterality unspecified, unspecified back pain laterality Assessment & Plan: Chronic, stable.  No medication changes made. Continue regimen compliant with Lyrica 150 mg 3 times daily, with additional 150mg  tablet prn. Max daily dose 600mg  total. Continue tramadol 50mg  qam, one tablet at lunch time, one  tablet at bedtime.  He will take additional tramadol  50mg  prn if needed once throughout the day. Maximum daily dose of tramadol is 200mg /day. He is taking Flexeril 5 mg nightly.   Follow-up in 3 months time.  Orders: -     traMADol HCl; TAKE TWO TABLETS BY MOUTH EVERY 12 HOURS PRN.  MAY TAKE ONE EXTRA TABLET FOR BREAKTHROUGH PAIN  Dispense: 150 tablet; Refill: 2     Return precautions given.   Risks, benefits, and alternatives of the medications and treatment plan prescribed today were discussed, and patient expressed understanding.   Education regarding symptom management and diagnosis given to patient on AVS either electronically or printed.  Return in about 3 months (around 11/08/2022).  [name], FNP  Subjective:    Patient ID: Philip Stephens, male    DOB: [birth year], 47 y.o.   MRN: [id number]  CC: Philip Stephens is a 47 y.o. male who presents today for follow up.   HPI: He complains of episodic nasal congestion and hoarseness over the past couple months.  He questions if aggravated by  reflux  No fever, sinus pain, sore throat,  pain with swallowing , unusual weght loss.   He is compliant with nexium 20mg  BID, pepcid otc.    Appetite is normal     Smoking cigarettes; he doesn't vape.   Back pain is unchanged. He is compliant with tramadol, lyrica.   Allergies: Penicillins Current Outpatient Medications on File Prior to Visit  Medication Sig Dispense Refill   acetaminophen (TYLENOL) 650 MG CR tablet Take 650 mg by mouth every 8 (eight) hours as needed for pain.     Aspirin-Salicylamide-Caffeine (ARTHRITIS STRENGTH BC POWDER PO) Take by mouth daily as needed.     Cholecalciferol 25 MCG (1000 UT) capsule Take by mouth.     cyclobenzaprine (FLEXERIL) 10 MG tablet Take 1 tablet (10 mg total) by mouth 3 (three) times daily as needed for muscle spasms. 30 tablet 2   esomeprazole (NEXIUM) 20 MG capsule TAKE 1 CAPSULE BY MOUTH 2 TIMES A DAY BEFORE A MEAL 180 capsule 1   ezetimibe (ZETIA) 10 MG tablet Take 1 tablet (10 mg total) by mouth daily. 90 tablet 3   famotidine (PEPCID) 20 MG tablet Take 20 mg by mouth daily.     metoprolol succinate (TOPROL-XL) 25 MG 24 hr tablet Take 1 tablet (25 mg total) by mouth daily. 90 tablet 3   Multiple Vitamin (MULTI-VITAMIN) tablet Take 1 tablet by mouth daily.     nicotine (NICODERM CQ - DOSED IN MG/24 HOURS) 14 mg/24hr patch Place 1 patch (14 mg total)  onto the skin daily. 28 patch 2   pregabalin (LYRICA) 150 MG capsule TAKE 1 CAPSULE BY MOUTH 3 TIMES A DAY AND 1 CAPSULE EXTRA DAILY IF NEEDED FOR PAIN 120 capsule 2   rosuvastatin (CRESTOR) 5 MG tablet Take 1 tablet (5 mg total) by mouth daily. 90 tablet 3   tizanidine (ZANAFLEX) 2 MG capsule Take 1 capsule (2 mg total) by mouth daily as needed for muscle spasms. Do not take with flexeril. 30 capsule 1   No current facility-administered medications on file prior to visit.    Review of Systems  Constitutional:  Negative for chills and fever.  HENT:  Positive for congestion and voice  change (episodic). Negative for sinus pain, sore throat and trouble swallowing.   Respiratory:  Negative for cough.   Cardiovascular:  Negative for chest pain and palpitations.  Gastrointestinal:  Negative for nausea and vomiting.      Objective:    BP 116/80   Pulse 84   Temp 98 F (36.7 C) (Oral)   Ht 6\' 1"  (1.854 m)   Wt 164 lb 6.4 oz (74.6 kg)   SpO2 98%   BMI 21.69 kg/m  BP Readings from Last 3 Encounters:  08/08/22 116/80  06/03/22 112/70  05/24/22 118/82   Wt Readings from Last 3 Encounters:  08/08/22 164 lb 6.4 oz (74.6 kg)  06/03/22 163 lb 12.8 oz (74.3 kg)  05/24/22 161 lb 12.8 oz (73.4 kg)    Physical Exam Vitals reviewed.  Constitutional:      Appearance: He is well-developed.  HENT:     Head: Normocephalic and atraumatic.     Right Ear: Hearing, tympanic membrane, ear canal and external ear normal. No decreased hearing noted. No drainage, swelling or tenderness. No middle ear effusion. Tympanic membrane is not injected, erythematous or bulging.     Left Ear: Hearing, tympanic membrane, ear canal and external ear normal. No decreased hearing noted. No drainage, swelling or tenderness.  No middle ear effusion. Tympanic membrane is not injected, erythematous or bulging.     Nose: Nose normal.     Right Turbinates: Not enlarged, swollen or pale.     Left Turbinates: Not enlarged, swollen or pale.     Right Sinus: No maxillary sinus tenderness or frontal sinus tenderness.     Left Sinus: No maxillary sinus tenderness or frontal sinus tenderness.     Mouth/Throat:     Pharynx: Uvula midline. No oropharyngeal exudate or posterior oropharyngeal erythema.     Tonsils: No tonsillar abscesses.  Eyes:     Conjunctiva/sclera: Conjunctivae normal.  Cardiovascular:     Rate and Rhythm: Regular rhythm.     Heart sounds: Normal heart sounds.  Pulmonary:     Effort: Pulmonary effort is normal. No respiratory distress.     Breath sounds: Normal breath sounds. No wheezing,  rhonchi or rales.  Lymphadenopathy:     Head:     Right side of head: No submental, submandibular, tonsillar, preauricular, posterior auricular or occipital adenopathy.     Left side of head: No submental, submandibular, tonsillar, preauricular, posterior auricular or occipital adenopathy.     Cervical: No cervical adenopathy.  Skin:    General: Skin is warm and dry.  Neurological:     Mental Status: He is alert.  Psychiatric:        Speech: Speech normal.        Behavior: Behavior normal.

## 2022-08-08 NOTE — Patient Instructions (Addendum)
Trial of flonase and/or Claritin over the counter.  Please continue Nexium and Pepcid  If no improvement of hoarseness, please let me know and I recommend ENT consult for follow-up with gastroenterology

## 2022-09-06 ENCOUNTER — Telehealth: Payer: Self-pay | Admitting: Cardiovascular Disease

## 2022-09-06 NOTE — Telephone Encounter (Signed)
Left voice mail to schedule appt

## 2022-10-05 ENCOUNTER — Encounter: Payer: Self-pay | Admitting: Family

## 2022-10-05 ENCOUNTER — Other Ambulatory Visit: Payer: Self-pay | Admitting: Family

## 2022-10-05 DIAGNOSIS — G8929 Other chronic pain: Secondary | ICD-10-CM

## 2022-10-05 DIAGNOSIS — M545 Low back pain, unspecified: Secondary | ICD-10-CM

## 2022-10-11 ENCOUNTER — Ambulatory Visit: Payer: BC Managed Care – PPO | Admitting: Family

## 2022-10-11 ENCOUNTER — Encounter: Payer: Self-pay | Admitting: Family

## 2022-10-11 VITALS — BP 126/76 | HR 90 | Temp 97.7°F | Ht 73.0 in | Wt 162.4 lb

## 2022-10-11 DIAGNOSIS — K219 Gastro-esophageal reflux disease without esophagitis: Secondary | ICD-10-CM

## 2022-10-11 DIAGNOSIS — R0789 Other chest pain: Secondary | ICD-10-CM

## 2022-10-11 DIAGNOSIS — I2510 Atherosclerotic heart disease of native coronary artery without angina pectoris: Secondary | ICD-10-CM

## 2022-10-11 DIAGNOSIS — M542 Cervicalgia: Secondary | ICD-10-CM | POA: Diagnosis not present

## 2022-10-11 DIAGNOSIS — R1032 Left lower quadrant pain: Secondary | ICD-10-CM

## 2022-10-11 DIAGNOSIS — M5440 Lumbago with sciatica, unspecified side: Secondary | ICD-10-CM | POA: Diagnosis not present

## 2022-10-11 DIAGNOSIS — G8929 Other chronic pain: Secondary | ICD-10-CM

## 2022-10-11 DIAGNOSIS — E119 Type 2 diabetes mellitus without complications: Secondary | ICD-10-CM

## 2022-10-11 MED ORDER — CYCLOBENZAPRINE HCL 10 MG PO TABS: 5.0000 mg | ORAL_TABLET | Freq: Every day | ORAL | Status: DC

## 2022-10-11 MED ORDER — CYCLOBENZAPRINE HCL 10 MG PO TABS: 10.0000 mg | ORAL_TABLET | Freq: Every day | ORAL | Status: DC

## 2022-10-11 NOTE — Patient Instructions (Signed)
Please return cologuard as soon as a possible

## 2022-10-11 NOTE — Progress Notes (Unsigned)
Assessment & Plan:  There are no diagnoses linked to this encounter.   Return precautions given.   Risks, benefits, and alternatives of the medications and treatment plan prescribed today were discussed, and patient expressed understanding.   Education regarding symptom management and diagnosis given to patient on AVS either electronically or printed.  No follow-ups on file.  [name], FNP  Subjective:    Patient ID: Philip Stephens, male    DOB: 1975-10-27, 30 y.o.   MRN: [id number]  CC: Philip Stephens is a 47 y.o. male who presents today for follow up.   HPI: HPI He will go all day without eating and then he 'eats too much' and not eating the 'right things'.   Abdominal pain improves with nexium and carfate.   No cp,  left arm numbness or jaw numbness, fever, weight loss, N, V, constipation.    Back pain is stable.    Previously opted to have Cologuard done, he did not.  History of diverticulitis and he never scheduled colonoscopy Allergies: Penicillins Current Outpatient Medications on File Prior to Visit  Medication Sig Dispense Refill   acetaminophen (TYLENOL) 650 MG CR tablet Take 650 mg by mouth every 8 (eight) hours as needed for pain.     Aspirin-Salicylamide-Caffeine (ARTHRITIS STRENGTH BC POWDER PO) Take by mouth daily as needed.     Cholecalciferol 25 MCG (1000 UT) capsule Take by mouth.     cyclobenzaprine (FLEXERIL) 10 MG tablet Take 1 tablet (10 mg total) by mouth 3 (three) times daily as needed for muscle spasms. 30 tablet 2   esomeprazole (NEXIUM) 20 MG capsule TAKE 1 CAPSULE BY MOUTH 2 TIMES A DAY BEFORE A MEAL 180 capsule 1   ezetimibe (ZETIA) 10 MG tablet Take 1 tablet (10 mg total) by mouth daily. 90 tablet 3   famotidine (PEPCID) 20 MG tablet Take 20 mg by mouth daily.     fluticasone (FLONASE) 50 MCG/ACT nasal spray Place 2 sprays into both nostrils daily. 16 g 6   metoprolol succinate (TOPROL-XL) 25 MG 24 hr tablet Take 1 tablet (25 mg  total) by mouth daily. 90 tablet 3   Multiple Vitamin (MULTI-VITAMIN) tablet Take 1 tablet by mouth daily.     nicotine (NICODERM CQ - DOSED IN MG/24 HOURS) 14 mg/24hr patch Place 1 patch (14 mg total) onto the skin daily. 28 patch 2   pregabalin (LYRICA) 150 MG capsule TAKE 1 CAPSULE BY MOUTH 3 TIMES A DAY AND MAY TAKE ONE EXTRA CAPSULE DAILY IF NEEDED FOR PAIN 120 capsule 2   rosuvastatin (CRESTOR) 5 MG tablet Take 1 tablet (5 mg total) by mouth daily. 90 tablet 3   tizanidine (ZANAFLEX) 2 MG capsule Take 1 capsule (2 mg total) by mouth daily as needed for muscle spasms. Do not take with flexeril. 30 capsule 1   traMADol (ULTRAM) 50 MG tablet TAKE TWO TABLETS BY MOUTH EVERY 12 HOURS PRN.  MAY TAKE ONE EXTRA TABLET FOR BREAKTHROUGH PAIN 150 tablet 2   No current facility-administered medications on file prior to visit.    Review of Systems    Objective:    BP 126/76   Pulse 90   Temp 97.7 F (36.5 C) (Oral)   Ht 6\' 1"  (1.854 m)   Wt 162 lb 6.4 oz (73.7 kg)   SpO2 97%   BMI 21.43 kg/m  BP Readings from Last 3 Encounters:  10/11/22 126/76  08/08/22 116/80  06/03/22 112/70   Wt Readings from  Last 3 Encounters:  10/11/22 162 lb 6.4 oz (73.7 kg)  08/08/22 164 lb 6.4 oz (74.6 kg)  06/03/22 163 lb 12.8 oz (74.3 kg)    Physical Exam

## 2022-10-12 DIAGNOSIS — R0789 Other chest pain: Secondary | ICD-10-CM | POA: Insufficient documentation

## 2022-10-12 NOTE — Assessment & Plan Note (Signed)
Reassuring abdominal exam.  Strict return precautions  if abdominal pain were to recur  particularly in setting of known history of diverticulitis.  Patient understands importance of staying vigilant and letting me know if symptoms were to recur so we can proceed with imaging . He will return his Cologuard test as well.

## 2022-10-12 NOTE — Assessment & Plan Note (Addendum)
Chronic, overall stable.  He is aware of particular trigger foods for his reflux and working on healthier diet.  Continue Nexium 20mg  qd and Carafate as needed.

## 2022-10-12 NOTE — Assessment & Plan Note (Addendum)
CT calcium score 2020 CT coronary calcium score with minimal coronary calcium, score of 3  atypical chest pain.  Not associated with exertion.  Patient is able to reproduce although I was unable to reproduce on exam today. question if underlying GERD or chronic back pain contributory.  We discussed previous reassuring CT calcium score.  He will continue to follow closely with cardiology. will follow.

## 2022-10-12 NOTE — Assessment & Plan Note (Signed)
Chronic, stable.  No medication changes made. Continue regimen compliant with Lyrica 150 mg 3 times daily, with additional 150mg  tablet prn. Max daily dose 600mg  total. Continue tramadol 50mg  qam, one tablet at lunch time, one  tablet at bedtime.  He will take additional tramadol  50mg  prn if needed once throughout the day. Maximum daily dose of tramadol is 200mg /day. He is taking Flexeril 5 mg nightly.   Follow-up in 3 months time.

## 2022-10-24 ENCOUNTER — Encounter: Payer: Self-pay | Admitting: Family

## 2022-10-25 ENCOUNTER — Other Ambulatory Visit: Payer: BC Managed Care – PPO

## 2022-10-26 ENCOUNTER — Other Ambulatory Visit: Payer: Self-pay | Admitting: Family

## 2022-10-26 DIAGNOSIS — K219 Gastro-esophageal reflux disease without esophagitis: Secondary | ICD-10-CM

## 2022-10-26 MED ORDER — SUCRALFATE 1 G PO TABS
1.0000 g | ORAL_TABLET | Freq: Three times a day (TID) | ORAL | 0 refills | Status: AC | PRN
Start: 2022-10-26 — End: ?

## 2022-10-31 ENCOUNTER — Other Ambulatory Visit: Payer: Self-pay | Admitting: Family

## 2022-10-31 DIAGNOSIS — M542 Cervicalgia: Secondary | ICD-10-CM

## 2022-11-01 ENCOUNTER — Other Ambulatory Visit: Payer: Self-pay | Admitting: Family

## 2022-11-01 DIAGNOSIS — G8929 Other chronic pain: Secondary | ICD-10-CM

## 2022-11-01 DIAGNOSIS — M542 Cervicalgia: Secondary | ICD-10-CM

## 2022-11-01 MED ORDER — TRAMADOL HCL 50 MG PO TABS: ORAL_TABLET | ORAL | 2 refills | Status: DC

## 2022-11-01 MED ORDER — CYCLOBENZAPRINE HCL 10 MG PO TABS: 5.0000 mg | ORAL_TABLET | Freq: Every day | ORAL | 1 refills | Status: DC

## 2022-11-01 NOTE — Telephone Encounter (Signed)
Needs appt for refill of guaifenesin- codeine.

## 2022-11-01 NOTE — Telephone Encounter (Signed)
NOTED

## 2022-11-02 ENCOUNTER — Encounter: Payer: Self-pay | Admitting: Family

## 2022-11-02 ENCOUNTER — Ambulatory Visit: Payer: BC Managed Care – PPO | Admitting: Family

## 2022-11-02 VITALS — BP 128/82 | HR 64 | Temp 97.6°F | Ht 73.0 in | Wt 161.8 lb

## 2022-11-02 DIAGNOSIS — J0100 Acute maxillary sinusitis, unspecified: Secondary | ICD-10-CM

## 2022-11-02 DIAGNOSIS — I2510 Atherosclerotic heart disease of native coronary artery without angina pectoris: Secondary | ICD-10-CM | POA: Diagnosis not present

## 2022-11-02 DIAGNOSIS — E119 Type 2 diabetes mellitus without complications: Secondary | ICD-10-CM | POA: Diagnosis not present

## 2022-11-02 DIAGNOSIS — J40 Bronchitis, not specified as acute or chronic: Secondary | ICD-10-CM

## 2022-11-02 LAB — LIPID PANEL
Cholesterol: 134 mg/dL (ref 0–200)
HDL: 34.4 mg/dL — ABNORMAL LOW (ref >39.00)
LDL Cholesterol: 60 mg/dL (ref 0–99)
NonHDL: 99.54
Total CHOL/HDL Ratio: 4
Triglycerides: 198 mg/dL — ABNORMAL HIGH (ref 0.0–149.0)
VLDL: 39.6 mg/dL (ref 0.0–40.0)

## 2022-11-02 LAB — COMPREHENSIVE METABOLIC PANEL
ALT: 16 U/L (ref 0–53)
AST: 18 U/L (ref 0–37)
Albumin: 4.2 g/dL (ref 3.5–5.2)
Alkaline Phosphatase: 84 U/L (ref 39–117)
BUN: 13 mg/dL (ref 6–23)
CO2: 27 meq/L (ref 19–32)
Calcium: 8.9 mg/dL (ref 8.4–10.5)
Chloride: 107 meq/L (ref 96–112)
Creatinine, Ser: 0.96 mg/dL (ref 0.40–1.50)
GFR: 93.69 mL/min (ref >60.00)
Glucose, Bld: 81 mg/dL (ref 70–99)
Potassium: 4 meq/L (ref 3.5–5.1)
Sodium: 142 meq/L (ref 135–145)
Total Bilirubin: 0.3 mg/dL (ref 0.2–1.2)
Total Protein: 6.7 g/dL (ref 6.0–8.3)

## 2022-11-02 LAB — TSH: TSH: 1.98 u[IU]/mL (ref 0.35–5.50)

## 2022-11-02 LAB — CBC WITH DIFFERENTIAL/PLATELET
Basophils Absolute: 0 10*3/uL (ref 0.0–0.1)
Basophils Relative: 0.3 % (ref 0.0–3.0)
Eosinophils Absolute: 0.3 10*3/uL (ref 0.0–0.7)
Eosinophils Relative: 2.8 % (ref 0.0–5.0)
HCT: 42.8 % (ref 39.0–52.0)
Hemoglobin: 13.9 g/dL (ref 13.0–17.0)
Lymphocytes Relative: 26.8 % (ref 12.0–46.0)
Lymphs Abs: 2.8 10*3/uL (ref 0.7–4.0)
MCHC: 32.4 g/dL (ref 30.0–36.0)
MCV: 91.4 fL (ref 78.0–100.0)
Monocytes Absolute: 0.6 10*3/uL (ref 0.1–1.0)
Monocytes Relative: 5.9 % (ref 3.0–12.0)
Neutro Abs: 6.8 10*3/uL (ref 1.4–7.7)
Neutrophils Relative %: 64.2 % (ref 43.0–77.0)
Platelets: 257 10*3/uL (ref 150.0–400.0)
RBC: 4.68 Mil/uL (ref 4.22–5.81)
RDW: 14.3 % (ref 11.5–15.5)
WBC: 10.6 10*3/uL — ABNORMAL HIGH (ref 4.0–10.5)

## 2022-11-02 LAB — HEMOGLOBIN A1C: Hgb A1c MFr Bld: 6.5 % (ref 4.6–6.5)

## 2022-11-02 MED ORDER — AZITHROMYCIN 250 MG PO TABS: ORAL_TABLET | ORAL | 0 refills | Status: AC

## 2022-11-02 MED ORDER — GUAIFENESIN-CODEINE 100-10 MG/5ML PO SYRP: 5.0000 mL | ORAL_SOLUTION | Freq: Every evening | ORAL | 0 refills | Status: DC | PRN

## 2022-11-02 NOTE — Progress Notes (Unsigned)
   Assessment & Plan:  There are no diagnoses linked to this encounter.   Return precautions given.   Risks, benefits, and alternatives of the medications and treatment plan prescribed today were discussed, and patient expressed understanding.   Education regarding symptom management and diagnosis given to patient on AVS either electronically or printed.  No follow-ups on file.  [name], FNP  Subjective:    Patient ID: Philip Stephens, male    DOB: 02/12/1975, 47 y.o.   MRN: [id number]  CC: Philip Stephens is a 47 y.o. male who presents today for an acute visit.    HPI: Complains of sinus congestion, sinus pain x 7 days  Coughing at night. Green and yellow purulent nasal congestion.   Using dayquil with some relief.   He would like codeine based syrup.   Reports wife and child similar symptoms.  He is leaving the country in 2 days.  Denies fever, sore throat, sob, wheezing    He never tried tizanidine 2mg   He has tizanidzine and robaxin at home.     Allergies: Penicillins Current Outpatient Medications on File Prior to Visit  Medication Sig Dispense Refill   acetaminophen (TYLENOL) 650 MG CR tablet Take 650 mg by mouth every 8 (eight) hours as needed for pain.     Aspirin-Salicylamide-Caffeine (ARTHRITIS STRENGTH BC POWDER PO) Take by mouth daily as needed.     Cholecalciferol 25 MCG (1000 UT) capsule Take by mouth.     cyclobenzaprine (FLEXERIL) 10 MG tablet Take 0.5 tablets (5 mg total) by mouth at bedtime. 30 tablet 1   esomeprazole (NEXIUM) 20 MG capsule TAKE 1 CAPSULE BY MOUTH 2 TIMES A DAY BEFORE A MEAL 180 capsule 1   ezetimibe (ZETIA) 10 MG tablet Take 1 tablet (10 mg total) by mouth daily. 90 tablet 3   famotidine (PEPCID) 20 MG tablet Take 20 mg by mouth daily.     fluticasone (FLONASE) 50 MCG/ACT nasal spray Place 2 sprays into both nostrils daily. 16 g 6   metoprolol succinate (TOPROL-XL) 25 MG 24 hr tablet Take 1 tablet (25 mg total) by mouth  daily. 90 tablet 3   Multiple Vitamin (MULTI-VITAMIN) tablet Take 1 tablet by mouth daily.     nicotine (NICODERM CQ - DOSED IN MG/24 HOURS) 14 mg/24hr patch Place 1 patch (14 mg total) onto the skin daily. 28 patch 2   pregabalin (LYRICA) 150 MG capsule TAKE 1 CAPSULE BY MOUTH 3 TIMES A DAY AND MAY TAKE ONE EXTRA CAPSULE DAILY IF NEEDED FOR PAIN 120 capsule 2   rosuvastatin (CRESTOR) 5 MG tablet Take 1 tablet (5 mg total) by mouth daily. 90 tablet 3   sucralfate (CARAFATE) 1 g tablet Take 1 tablet (1 g total) by mouth 3 (three) times daily as needed. 90 tablet 0   traMADol (ULTRAM) 50 MG tablet TAKE TWO TABLETS BY MOUTH EVERY 12 HOURS PRN.  MAY TAKE ONE EXTRA TABLET FOR BREAKTHROUGH PAIN 150 tablet 2   No current facility-administered medications on file prior to visit.    Review of Systems    Objective:    There were no vitals taken for this visit.  BP Readings from Last 3 Encounters:  10/11/22 126/76  08/08/22 116/80  06/03/22 112/70   Wt Readings from Last 3 Encounters:  10/11/22 162 lb 6.4 oz (73.7 kg)  08/08/22 164 lb 6.4 oz (74.6 kg)  06/03/22 163 lb 12.8 oz (74.3 kg)    Physical Exam

## 2022-11-03 NOTE — Assessment & Plan Note (Signed)
Afebrile.  Nontoxic in appearance.  Duration 7 days.  Due to persistence of symptoms, concern for bacterial infection.  Provided azithromycin, Cheratussin.  Counseled on Cheratussin being sedating.  Advised start probiotics.  He will let me know how he is doing

## 2022-11-03 NOTE — Patient Instructions (Signed)
I provided you with azithromycin start for suspected bacterial sinusitis.  I have also given you a codeine-based cough syrup  Please take cough medication at night only as needed. As we discussed, I do not recommend dosing throughout the day as coughing is a protective mechanism . It also helps to break up thick mucous.  Do not take cough suppressants with alcohol as can lead to trouble breathing. Advise caution if taking cough suppressant and operating machinery ( i.e driving a car) as you may feel very tired.     Ensure to take probiotics while on antibiotics and also for 2 weeks after completion. This can either be by eating yogurt daily or taking a probiotic supplement over the counter such as Culturelle.It is important to re-colonize the gut with good bacteria and also to prevent any diarrheal infections associated with antibiotic use.

## 2022-11-04 ENCOUNTER — Ambulatory Visit: Payer: BC Managed Care – PPO | Attending: Cardiovascular Disease | Admitting: Cardiovascular Disease

## 2022-11-04 ENCOUNTER — Encounter: Payer: Self-pay | Admitting: Cardiovascular Disease

## 2022-11-04 NOTE — Progress Notes (Deleted)
NO SHOW

## 2022-11-29 ENCOUNTER — Other Ambulatory Visit: Payer: Self-pay | Admitting: Family

## 2022-11-29 ENCOUNTER — Encounter: Payer: Self-pay | Admitting: Family

## 2022-11-29 DIAGNOSIS — R37 Sexual dysfunction, unspecified: Secondary | ICD-10-CM

## 2022-11-29 MED ORDER — TADALAFIL 5 MG PO TABS: 2.5000 mg | ORAL_TABLET | Freq: Every day | ORAL | 1 refills | Status: DC | PRN

## 2022-12-14 ENCOUNTER — Other Ambulatory Visit: Payer: Self-pay | Admitting: Family

## 2022-12-14 DIAGNOSIS — M545 Low back pain, unspecified: Secondary | ICD-10-CM

## 2022-12-21 ENCOUNTER — Encounter: Payer: Self-pay | Admitting: Family

## 2022-12-23 ENCOUNTER — Telehealth: Payer: Self-pay

## 2022-12-23 NOTE — Telephone Encounter (Signed)
Spoke to pt to inform him that he needs an in office appt

## 2022-12-23 NOTE — Telephone Encounter (Signed)
Called pt back and informed him that he needs an in office visit per [facility]

## 2022-12-23 NOTE — Telephone Encounter (Signed)
Copied from CRM [id number]. Topic: General - Other >> Dec 23, 2022  9:08 AM Philip Stephens wrote: Reason for CRM: Patient requesting callback after missing call

## 2022-12-23 NOTE — Telephone Encounter (Signed)
Spoke to pt and he stated that he had spoke to [place] and she instructed him to contact her so she could send in something to pharmacy

## 2023-01-17 ENCOUNTER — Ambulatory Visit: Payer: BC Managed Care – PPO | Admitting: Family

## 2023-01-24 ENCOUNTER — Ambulatory Visit: Payer: BC Managed Care – PPO | Admitting: Family

## 2023-01-24 ENCOUNTER — Encounter: Payer: Self-pay | Admitting: Family

## 2023-01-24 VITALS — BP 118/66 | HR 85 | Ht 73.0 in | Wt 164.8 lb

## 2023-01-24 DIAGNOSIS — Z716 Tobacco abuse counseling: Secondary | ICD-10-CM

## 2023-01-24 DIAGNOSIS — M5440 Lumbago with sciatica, unspecified side: Secondary | ICD-10-CM | POA: Diagnosis not present

## 2023-01-24 DIAGNOSIS — K219 Gastro-esophageal reflux disease without esophagitis: Secondary | ICD-10-CM | POA: Diagnosis not present

## 2023-01-24 DIAGNOSIS — M542 Cervicalgia: Secondary | ICD-10-CM | POA: Diagnosis not present

## 2023-01-24 DIAGNOSIS — G8929 Other chronic pain: Secondary | ICD-10-CM

## 2023-01-24 MED ORDER — TRAMADOL HCL 50 MG PO TABS: ORAL_TABLET | ORAL | 2 refills | Status: DC

## 2023-01-24 MED ORDER — NICOTINE 21 MG/24HR TD PT24: 21.0000 mg | MEDICATED_PATCH | Freq: Every day | TRANSDERMAL | 1 refills | Status: DC

## 2023-01-24 MED ORDER — NICOTINE 21 MG/24HR TD PT24: 21.0000 mg | MEDICATED_PATCH | Freq: Every day | TRANSDERMAL | 0 refills | Status: DC

## 2023-01-24 NOTE — Assessment & Plan Note (Signed)
Chronic, suboptimal control.  Reiterated the importance of follow-up with gastroenterology for endoscopy, colonoscopy.  Continue Nexium 40 mg twice daily, Pepcid as needed, Carafate as needed

## 2023-01-24 NOTE — Progress Notes (Signed)
Assessment & Plan:  Encounter for smoking cessation counseling Assessment & Plan: Counseled on smoking cessation with use of nicotine patches.  Advised he would need to decrease immediately number of cigarettes he smokes a day while on the nicotine patch.  Orders: -     Nicotine; Place 1 patch (21 mg total) onto the skin daily.  Dispense: 28 patch; Refill: 1  Neck pain, musculoskeletal  Chronic low back pain with sciatica, sciatica laterality unspecified, unspecified back pain laterality Assessment & Plan: Chronic, stable.  No medication changes made.  Continue regimen compliant with Lyrica 150 mg 3 times daily, with additional 150mg  tablet prn. Max daily dose 600mg  total. Continue tramadol 50mg  qam, one tablet at lunch time, one  tablet at bedtime.  He will take additional tramadol  50mg  prn if needed once throughout the day. Maximum daily dose of tramadol is 200mg /day.  He is taking Flexeril 5 mg nightly.  Advised he may use tizanidine during the day as likely less sedating.  He will let me know how he is doing Follow-up in 3 months time.  Orders: -     traMADol HCl; TAKE TWO TABLETS BY MOUTH EVERY 12 HOURS PRN.  MAY TAKE ONE EXTRA TABLET FOR BREAKTHROUGH PAIN  Dispense: 150 tablet; Refill: 2  Gastroesophageal reflux disease, unspecified whether esophagitis present Assessment & Plan: Chronic, suboptimal control.  Reiterated the importance of follow-up with gastroenterology for endoscopy, colonoscopy.  Continue Nexium 40 mg twice daily, Pepcid as needed, Carafate as needed      Return precautions given.   Risks, benefits, and alternatives of the medications and treatment plan prescribed today were discussed, and patient expressed understanding.   Education regarding symptom management and diagnosis given to patient on AVS either electronically or printed.  No follow-ups on file.  [name], FNP  Subjective:    Patient ID: Philip Stephens, male    DOB: [birth year],  48 y.o.   MRN: [id number]  CC: Philip Stephens is a 48 y.o. male who presents today for follow up.   HPI: Here today for follow up and to discuss desire to quit smoking.  He smokes one pack of cigarettes daily.  Previously tried nicotine patch 14 mg which he did not find effective.  He would like to try higher dose.  He does not think he will need lower doses he plans to be able to stop smoking with just one box.  Continues to have episodic chest pain after eating spicy foods in particular.  He takes occasional Pepcid AC with relied.  He is taking 40 mg of Nexium in the morning and in the evening. No unusual weight loss  He is due for endoscopy, colonoscopy.  He request a refill of tramadol  Follow up with Dr Mariah Milling next month.   Abdominal pain from last month has resolved   Allergies: Penicillins and Prednisone Current Outpatient Medications on File Prior to Visit  Medication Sig Dispense Refill   acetaminophen (TYLENOL) 650 MG CR tablet Take 650 mg by mouth every 8 (eight) hours as needed for pain.     Aspirin-Salicylamide-Caffeine (ARTHRITIS STRENGTH BC POWDER PO) Take by mouth daily as needed.     cyclobenzaprine (FLEXERIL) 10 MG tablet Take 0.5 tablets (5 mg total) by mouth at bedtime. 30 tablet 1   esomeprazole (NEXIUM) 20 MG capsule TAKE 1 CAPSULE BY MOUTH 2 TIMES A DAY BEFORE A MEAL 180 capsule 1   ezetimibe (ZETIA) 10 MG tablet Take 1 tablet (10 mg  total) by mouth daily. 90 tablet 3   famotidine (PEPCID) 20 MG tablet Take 20 mg by mouth daily.     fluticasone (FLONASE) 50 MCG/ACT nasal spray Place 2 sprays into both nostrils daily. 16 g 6   metoprolol succinate (TOPROL-XL) 25 MG 24 hr tablet Take 1 tablet (25 mg total) by mouth daily. 90 tablet 3   Multiple Vitamin (MULTI-VITAMIN) tablet Take 1 tablet by mouth daily.     pregabalin (LYRICA) 150 MG capsule TAKE 1 CAPSULE BY MOUTH 3 TIMES A DAY AND 1 EXTRA CAPSULE DAILY IF NEEDED FOR PAIN 120 capsule 2   rosuvastatin (CRESTOR) 5  MG tablet Take 1 tablet (5 mg total) by mouth daily. 90 tablet 3   sucralfate (CARAFATE) 1 g tablet Take 1 tablet (1 g total) by mouth 3 (three) times daily as needed. 90 tablet 0   Cholecalciferol 25 MCG (1000 UT) capsule Take by mouth. (Patient not taking: Reported on 01/24/2023)     tadalafil (CIALIS) 5 MG tablet Take 0.5-1 tablets (2.5-5 mg total) by mouth daily as needed for erectile dysfunction. Max: 20mg /dose up to 1dose/24h (Patient not taking: Reported on 01/24/2023) 10 tablet 1   No current facility-administered medications on file prior to visit.    Review of Systems  Constitutional:  Negative for chills and fever.  Respiratory:  Negative for cough.   Cardiovascular:  Negative for chest pain and palpitations.  Gastrointestinal:  Negative for nausea and vomiting.      Objective:    BP 118/66   Pulse 85   Ht 6\' 1"  (1.854 m)   Wt 164 lb 12.8 oz (74.8 kg)   SpO2 97%   BMI 21.74 kg/m  BP Readings from Last 3 Encounters:  01/24/23 118/66  11/02/22 128/82  10/11/22 126/76   Wt Readings from Last 3 Encounters:  01/24/23 164 lb 12.8 oz (74.8 kg)  11/02/22 161 lb 12.8 oz (73.4 kg)  10/11/22 162 lb 6.4 oz (73.7 kg)    Physical Exam Vitals reviewed.  Constitutional:      Appearance: He is well-developed.  Cardiovascular:     Rate and Rhythm: Regular rhythm.     Heart sounds: Normal heart sounds.  Pulmonary:     Effort: Pulmonary effort is normal. No respiratory distress.     Breath sounds: Normal breath sounds. No wheezing, rhonchi or rales.  Skin:    General: Skin is warm and dry.  Neurological:     Mental Status: He is alert.  Psychiatric:        Speech: Speech normal.        Behavior: Behavior normal.

## 2023-01-24 NOTE — Patient Instructions (Addendum)
Please call [facility] [phone] to schedule follow-up with Dr. Tobi Bastos for endoscopy, colonoscopy   start nicotine patch  21mg  for 8 weeks and decrease number of  cigarettes .  You may then use next level down 14 mg.  You will continue to decrease smoking cigarettes until you get to the next level down of 7mg  until you are able to quit smoking.   Please purchase over-the-counter nicotine gum or lozenges to help with breakthrough cravings.  If you feel that we need to adjust patches or extend a particular patch, please not hesitate to let me know  Please review the below which I have included from [facility]  http://www.bowen.com/  How to Use Nicotine Patches  For best results, make sure you start on the right dose. The nicotine patch comes in three strengths (7 mg, 14 mg, 21 mg). The right dose for you depends on how much you currently smoke. If you smoke more than 10 cigarettes per day, consider starting on the 21 mg patch. Don't wear two patches at once unless directed to do so by your healthcare provider. Over time (typically after 8 to 12 weeks), you should lower the dose with the goal of stopping use of the patch completely. The nicotine patch is typically worn for 24 hours. The patch can even be worn when showering or bathing. When you wake up, put a fresh patch on clean skin and wear it for a full 24 hours. If you find that you are having vivid dreams or that your sleep is disturbed, you can take the patch off before bed and put a new one on the next morning. Put the patch on clean, dry, hair-free skin on the upper body. Usual places to put the patch are the upper chest, upper arm, shoulder, back, or inner arm. Avoid putting the patch on areas of irritated, oily, scarred, or damaged skin. Remove the patch from the foil package, peel off the protective strips, and immediately  apply the patch to your skin. Press down to ensure the patch sticks to your skin. Wash your hands with soap and water after you apply the patch to wash away any nicotine you may have gotten on your fingers when applying the patch. To avoid skin irritation, put the patch on a different area of your upper body each day. Avoid wearing the patch in the same place more than once per week. If the patch loosens or falls off, replace it with a new one. When changing your patch, remove the patch carefully and dispose of it by folding it in half with the sticky sides touching. Then apply a new patch to a different part of your upper body. Keep out of reach of children and pets. Nicotine patches - even used patches - may have enough nicotine to make children and pets sick. In case of accidental use or ingestion, contact a Poison Control Center right away [phone]). Learn more about nicotine patches, including side effects and precautions.  How to Use a Nicotine Patch to Quit Smoking  The nicotine patch is an FDA-approved medicine that can help people quit smoking. It can be used daily by itself to control withdrawal symptoms, or it may be used with nicotine gum or lozenge which are taken as needed for strong cravings. This video offers step-by-step instructions on how to use the nicotine patch by itself. This video is part of the SmokefreeVET partnership between the Department of [employer] and the [employer].gov Initiative. For  more help using medicines to quit smoking, call 1-800-QUIT-NOW or visit WithoutCable.hu. Talk with your healthcare provider about the best medicine for you. Using medicine together with behavioral counseling gives you the best chance of quitting smoking.  Quick patch tips  Combine the patch with nicotine gum or lozenge to better manage cravings. You can start using both the patch and gum or lozenge, or you can add gum or lozenge later, if you continue  to have withdrawal symptoms. Patches can provide a steady level of nicotine in the body to help lessen withdrawal, while the gum or lozenge can be used to more quickly relieve cravings as they happen. If you have a lot of cravings while using the patch, you may not be using a strong enough dose. Consider stepping up to a higher dose. If you are already on the highest dose, talk with your doctor or other healthcare provider for help with dosing. You can also consider adding gum or lozenge, as described above. What if I slip up and smoke while using the patch? You do not need to stop using the patch if you slip up and smoke while wearing it. Throw away your cigarettes and get back on track with your quit attempt. Keep using the patch as directed above. For best results, use the nicotine patch as part of a program that includes coaching support. Talk with your healthcare provider and connect with your state tobacco quitline (1-800-QUIT-NOW) for help.  A book I recommend for smoking cessation, Quit Smart Stop Smoking: With the [employer] System It's Easier Than You Think!  By  Al Decant Albertina Parr and Emmit Pomfret .

## 2023-01-24 NOTE — Assessment & Plan Note (Signed)
Chronic, stable.  No medication changes made.  Continue regimen compliant with Lyrica 150 mg 3 times daily, with additional 150mg  tablet prn. Max daily dose 600mg  total. Continue tramadol 50mg  qam, one tablet at lunch time, one  tablet at bedtime.  He will take additional tramadol  50mg  prn if needed once throughout the day. Maximum daily dose of tramadol is 200mg /day.  He is taking Flexeril 5 mg nightly.  Advised he may use tizanidine during the day as likely less sedating.  He will let me know how he is doing Follow-up in 3 months time.

## 2023-01-24 NOTE — Assessment & Plan Note (Signed)
Counseled on smoking cessation with use of nicotine patches.  Advised he would need to decrease immediately number of cigarettes he smokes a day while on the nicotine patch.

## 2023-01-26 ENCOUNTER — Other Ambulatory Visit: Payer: Self-pay

## 2023-01-26 ENCOUNTER — Other Ambulatory Visit: Payer: Self-pay | Admitting: Family

## 2023-01-26 DIAGNOSIS — G8929 Other chronic pain: Secondary | ICD-10-CM

## 2023-01-26 MED ORDER — TRAMADOL HCL 50 MG PO TABS: ORAL_TABLET | ORAL | 2 refills | Status: DC

## 2023-01-26 NOTE — Telephone Encounter (Signed)
Copied from CRM [id number]. Topic: Clinical - Prescription Issue >> Jan 26, 2023 10:14 AM Danika B wrote: Reason for CRM: Patient states that his Rx traMADol (ULTRAM) 50 MG tablet, has been sent in with a refill date of 01/29/2023 but leaves him with a gap of 2 days without medication. States that this happened before and the clinic attempted to fix it. Would like clarification on this or if it can be fixed. Wants to know if Claris Che Arnett's nurse can call. Callback [phone]

## 2023-02-01 ENCOUNTER — Ambulatory Visit: Payer: BC Managed Care – PPO | Admitting: Family

## 2023-02-13 NOTE — Progress Notes (Deleted)
 Date:  02/13/2023   ID:  Philip Stephens, DOB 01-Jul-1975, MRN [id number]  Patient Location:  [address] [name] [address]   Provider location:   [facility], [place] office  PCP:  Allegra Grana, FNP  Cardiologist:  [facility]   No chief complaint on file.    History of Present Illness:    Philip Stephens is a 48 y.o. male past medical history of Anxiety panic attacks Chronic back pain Former smoker, quit 02/2018 Coronary calcium score of 3 SVT Who presents for follow-up of his chest pain, palpitations  Last seen by myself in clinic February 2024   In follow-up today reports he continues to work at [employer] employees Some stress at work  Chronic neck pain, chest pain Previously was on left side of his body now seems to have moved over to right side of his body, thinks it is musculoskeletal  Tolerating Crestor 5 mg daily but does have some leg discomfort though reports it is tolerable Scheduled to have repeat lab work done through primary care  Reports he is smoking again  CT scan images pulled up and reviewed chest, abdomen No significant aortic atherosclerosis  EKG personally reviewed by myself on todays visit Normal sinus rhythm rate 69 bpm no significant ST-T wave changes  Other past medical history reviewed Last seen in clinic April 2021 Prior history atypical chest pain and evaluated September 2020, musculoskeletal Atypical chest pain symptoms April 2021,   CT scan March 2021, no PE      Past Medical History:  Diagnosis Date   Arthritis    Hypertension    No past surgical history on file.    Allergies:   Penicillins and Prednisone   Social History   Tobacco Use   Smoking status: Every Day    Current packs/day: 0.00    Average packs/day: 0.5 packs/day for 28.0 years (14.0 ttl pk-yrs)    Types: Cigarettes    Start date: 02/19/1990    Last attempt to quit: 02/19/2018    Years  since quitting: 4.9   Smokeless tobacco: Former  Building services engineer status: Former  Substance Use Topics   Alcohol use: Not Currently   Drug use: Never     Current Outpatient Medications on File Prior to Visit  Medication Sig Dispense Refill   acetaminophen (TYLENOL) 650 MG CR tablet Take 650 mg by mouth every 8 (eight) hours as needed for pain.     Aspirin-Salicylamide-Caffeine (ARTHRITIS STRENGTH BC POWDER PO) Take by mouth daily as needed.     Cholecalciferol 25 MCG (1000 UT) capsule Take by mouth. (Patient not taking: Reported on 01/24/2023)     cyclobenzaprine (FLEXERIL) 10 MG tablet Take 0.5 tablets (5 mg total) by mouth at bedtime. 30 tablet 1   esomeprazole (NEXIUM) 20 MG capsule TAKE 1 CAPSULE BY MOUTH 2 TIMES A DAY BEFORE A MEAL 180 capsule 1   ezetimibe (ZETIA) 10 MG tablet Take 1 tablet (10 mg total) by mouth daily. 90 tablet 3   famotidine (PEPCID) 20 MG tablet Take 20 mg by mouth daily.     fluticasone (FLONASE) 50 MCG/ACT nasal spray Place 2 sprays into both nostrils daily. 16 g 6   metoprolol succinate (TOPROL-XL) 25 MG 24 hr tablet Take 1 tablet (25 mg total) by mouth daily. 90 tablet 3   Multiple Vitamin (MULTI-VITAMIN) tablet Take 1 tablet by mouth daily.  nicotine (NICODERM CQ - DOSED IN MG/24 HOURS) 21 mg/24hr patch Place 1 patch (21 mg total) onto the skin daily. 28 patch 1   pregabalin (LYRICA) 150 MG capsule TAKE 1 CAPSULE BY MOUTH 3 TIMES A DAY AND 1 EXTRA CAPSULE DAILY IF NEEDED FOR PAIN 120 capsule 2   rosuvastatin (CRESTOR) 5 MG tablet Take 1 tablet (5 mg total) by mouth daily. 90 tablet 3   sucralfate (CARAFATE) 1 g tablet Take 1 tablet (1 g total) by mouth 3 (three) times daily as needed. 90 tablet 0   tadalafil (CIALIS) 5 MG tablet Take 0.5-1 tablets (2.5-5 mg total) by mouth daily as needed for erectile dysfunction. Max: 20mg /dose up to 1dose/24h (Patient not taking: Reported on 01/24/2023) 10 tablet 1   traMADol (ULTRAM) 50 MG tablet TAKE TWO TABLETS BY  MOUTH EVERY 12 HOURS PRN.  MAY TAKE ONE EXTRA TABLET FOR BREAKTHROUGH PAIN 150 tablet 2   No current facility-administered medications on file prior to visit.     Family Hx: The patient's family history includes Anxiety disorder in his mother; Arthritis in his father; Cancer in his maternal grandfather; Coronary artery disease (age of onset: 77) in his maternal grandfather; Diabetes in his father; Hearing loss in his father; Hyperlipidemia in his father.  ROS:   Please see the history of present illness.    Review of Systems  Constitutional: Negative.   HENT: Negative.    Respiratory: Negative.    Cardiovascular: Negative.   Gastrointestinal: Negative.   Musculoskeletal: Negative.   Neurological: Negative.   Psychiatric/Behavioral: Negative.    All other systems reviewed and are negative.    Labs/Other Tests and Data Reviewed:    Recent Labs: 11/02/2022: ALT 16; BUN 13; Creatinine, Ser 0.96; Hemoglobin 13.9; Platelets 257.0; Potassium 4.0; Sodium 142; TSH 1.98   Recent Lipid Panel Lab Results  Component Value Date/Time   CHOL 134 11/02/2022 12:04 PM   TRIG 198.0 (H) 11/02/2022 12:04 PM   HDL 34.40 (L) 11/02/2022 12:04 PM   CHOLHDL 4 11/02/2022 12:04 PM   LDLCALC 60 11/02/2022 12:04 PM   LDLDIRECT 149.0 08/11/2020 10:09 AM    Wt Readings from Last 3 Encounters:  01/24/23 164 lb 12.8 oz (74.8 kg)  11/02/22 161 lb 12.8 oz (73.4 kg)  10/11/22 162 lb 6.4 oz (73.7 kg)     Exam:    There were no vitals taken for this visit. Constitutional:  oriented to person, place, and time. No distress.  HENT:  Head: Grossly normal Eyes:  no discharge. No scleral icterus.  Neck: No JVD, no carotid bruits  Cardiovascular: Regular rate and rhythm, no murmurs appreciated Pulmonary/Chest: Clear to auscultation bilaterally, no wheezes or rails Abdominal: Soft.  no distension.  no tenderness.  Musculoskeletal: Normal range of motion Neurological:  normal muscle tone. Coordination normal.  No atrophy Skin: Skin warm and dry Psychiatric: normal affect, pleasant  ASSESSMENT & PLAN:    Problem List Items Addressed This Visit   None  Atypical chest pain Atypical in nature, no further workup needed  Coronary calcification Calcium score of 3, minimally elevated Additional CT scan images reviewed in the office today, no aortic atherosclerosis  Hyperlipidemia Tolerating Crestor 5 daily, scheduled for lab work  Smoking We have encouraged him to continue to work on weaning his cigarettes and smoking cessation. He will continue to work on this and does not want any assistance with chantix.    GERD On Nexium, as well as Pepcid for breakthrough  Total encounter time more than 30 minutes  Greater than 50% was spent in counseling and coordination of care with the patient   Signed, Julien Nordmann, MD  [facility] [address], [place], [place] [address]

## 2023-02-14 ENCOUNTER — Ambulatory Visit: Payer: BC Managed Care – PPO | Admitting: Cardiovascular Disease

## 2023-02-14 DIAGNOSIS — Z716 Tobacco abuse counseling: Secondary | ICD-10-CM

## 2023-02-14 DIAGNOSIS — E119 Type 2 diabetes mellitus without complications: Secondary | ICD-10-CM

## 2023-02-14 DIAGNOSIS — I2510 Atherosclerotic heart disease of native coronary artery without angina pectoris: Secondary | ICD-10-CM

## 2023-02-14 DIAGNOSIS — R0789 Other chest pain: Secondary | ICD-10-CM

## 2023-02-14 DIAGNOSIS — E782 Mixed hyperlipidemia: Secondary | ICD-10-CM

## 2023-02-14 DIAGNOSIS — R079 Chest pain, unspecified: Secondary | ICD-10-CM

## 2023-03-07 ENCOUNTER — Other Ambulatory Visit: Payer: Self-pay | Admitting: Family

## 2023-03-07 DIAGNOSIS — M542 Cervicalgia: Secondary | ICD-10-CM

## 2023-03-22 ENCOUNTER — Encounter: Payer: Self-pay | Admitting: Family

## 2023-03-23 ENCOUNTER — Other Ambulatory Visit: Payer: Self-pay | Admitting: Family

## 2023-03-23 DIAGNOSIS — M545 Low back pain, unspecified: Secondary | ICD-10-CM

## 2023-03-23 MED ORDER — PREGABALIN 150 MG PO CAPS
ORAL_CAPSULE | ORAL | 2 refills | Status: DC
Start: 2023-03-23 — End: 2023-06-15

## 2023-04-19 ENCOUNTER — Ambulatory Visit: Payer: 59 | Admitting: Dermatology

## 2023-04-20 ENCOUNTER — Other Ambulatory Visit: Payer: Self-pay | Admitting: Family

## 2023-04-23 NOTE — Progress Notes (Signed)
 Date:  04/24/2023   ID:  Philip Stephens, DOB 1975-04-24, MRN [id number]  Patient Location:  [address] [place] [address]   Provider location:   [facility], [place] office  PCP:  Calista Catching, FNP  Cardiologist:  [facility]   Chief Complaint  Patient presents with   12 month follow up     Patient c/o chest pain; feels it's more muscular.      History of Present Illness:    Philip Stephens is a 48 y.o. male past medical history of Anxiety panic attacks Chronic back pain Former smoker, quit 02/2018, restarted Coronary calcium  score of 3 SVT Who presents for follow-up of his chest pain, palpitations  Last seen by myself in clinic February 2024 In follow-up today reports he continues to have stress at work, Working at a new company, [employer], always on the move Active at home, likes to work on cars in his shop  Having some atypical left rib pain sometimes radiating through to his back, feels like it is musculoskeletal  Started smoking again  Tolerating Crestor  5 mg daily with Zetia  10 mg daily  CT scan chest, abdomen No significant aortic atherosclerosis  EKG personally reviewed by myself on todays visit EKG Interpretation Date/Time:  Monday April 24 2023 16:28:38 EDT Ventricular Rate:  78 PR Interval:  172 QRS Duration:  100 QT Interval:  414 QTC Calculation: 471 R Axis:   50  Text Interpretation: Normal sinus rhythm Normal ECG When compared with ECG of 26-Nov-2017 15:43, No significant change was found Confirmed by Belva Boyden [phone]) on 04/24/2023 4:35:24 PM   Other past medical history reviewed Last seen in clinic April 2021 Prior history atypical chest pain and evaluated September 2020, musculoskeletal Atypical chest pain symptoms April 2021,   CT scan March 2021, no PE    Past Medical History:  Diagnosis Date   Arthritis    Hypertension    History reviewed. No pertinent surgical  history.    Allergies:   Penicillins and Prednisone    Social History   Tobacco Use   Smoking status: Every Day    Current packs/day: 0.00    Average packs/day: 0.5 packs/day for 28.0 years (14.0 ttl pk-yrs)    Types: Cigarettes    Start date: 02/19/1990    Last attempt to quit: 02/19/2018    Years since quitting: 5.1   Smokeless tobacco: Former  Building services engineer status: Former  Substance Use Topics   Alcohol use: Not Currently   Drug use: Never     Current Outpatient Medications on File Prior to Visit  Medication Sig Dispense Refill   acetaminophen (TYLENOL) 650 MG CR tablet Take 650 mg by mouth every 8 (eight) hours as needed for pain.     Aspirin-Salicylamide-Caffeine (ARTHRITIS STRENGTH BC POWDER PO) Take by mouth daily as needed.     Cholecalciferol 25 MCG (1000 UT) capsule Take by mouth.     cyclobenzaprine  (FLEXERIL ) 10 MG tablet TAKE ONE-HALF TABLET BY MOUTH AT BEDTIME 30 tablet 1   esomeprazole  (NEXIUM ) 20 MG capsule TAKE 1 CAPSULE BY MOUTH 2 TIMES A DAY BEFORE A MEAL 180 capsule 1   ezetimibe  (ZETIA ) 10 MG tablet TAKE 1 TABLET BY MOUTH DAILY 30 tablet 2   famotidine (PEPCID) 20 MG tablet Take 20 mg by mouth daily.     metoprolol  succinate (TOPROL -XL) 25 MG 24 hr tablet TAKE 1 TABLET BY MOUTH DAILY  30 tablet 2   Multiple Vitamin (MULTI-VITAMIN) tablet Take 1 tablet by mouth daily.     pregabalin  (LYRICA ) 150 MG capsule TAKE 1 CAPSULE BY MOUTH 3 TIMES A DAY AND 1 EXTRA CAPSULE DAILY IF NEEDED FOR PAIN 120 capsule 2   rosuvastatin  (CRESTOR ) 5 MG tablet Take 1 tablet (5 mg total) by mouth daily. 90 tablet 3   tadalafil  (CIALIS ) 5 MG tablet Take 0.5-1 tablets (2.5-5 mg total) by mouth daily as needed for erectile dysfunction. Max: 20mg /dose up to 1dose/24h 10 tablet 1   traMADol  (ULTRAM ) 50 MG tablet TAKE TWO TABLETS BY MOUTH EVERY 12 HOURS PRN.  MAY TAKE ONE EXTRA TABLET FOR BREAKTHROUGH PAIN 150 tablet 2   fluticasone  (FLONASE ) 50 MCG/ACT nasal spray Place 2 sprays into  both nostrils daily. (Patient not taking: Reported on 04/24/2023) 16 g 6   nicotine  (NICODERM CQ  - DOSED IN MG/24 HOURS) 21 mg/24hr patch Place 1 patch (21 mg total) onto the skin daily. (Patient not taking: Reported on 04/24/2023) 28 patch 1   sucralfate  (CARAFATE ) 1 g tablet Take 1 tablet (1 g total) by mouth 3 (three) times daily as needed. (Patient not taking: Reported on 04/24/2023) 90 tablet 0   No current facility-administered medications on file prior to visit.     Family Hx: The patient's family history includes Anxiety disorder in his mother; Arthritis in his father; Cancer in his maternal grandfather; Coronary artery disease (age of onset: 28) in his maternal grandfather; Diabetes in his father; Hearing loss in his father; Hyperlipidemia in his father.  ROS:   Please see the history of present illness.    Review of Systems  Constitutional: Negative.   HENT: Negative.    Respiratory: Negative.    Cardiovascular: Negative.   Gastrointestinal: Negative.   Musculoskeletal: Negative.   Neurological: Negative.   Psychiatric/Behavioral: Negative.    All other systems reviewed and are negative.    Labs/Other Tests and Data Reviewed:    Recent Labs: 11/02/2022: ALT 16; BUN 13; Creatinine, Ser 0.96; Hemoglobin 13.9; Platelets 257.0; Potassium 4.0; Sodium 142; TSH 1.98   Recent Lipid Panel Lab Results  Component Value Date/Time   CHOL 134 11/02/2022 12:04 PM   TRIG 198.0 (H) 11/02/2022 12:04 PM   HDL 34.40 (L) 11/02/2022 12:04 PM   CHOLHDL 4 11/02/2022 12:04 PM   LDLCALC 60 11/02/2022 12:04 PM   LDLDIRECT 149.0 08/11/2020 10:09 AM    Wt Readings from Last 3 Encounters:  04/24/23 163 lb 6 oz (74.1 kg)  01/24/23 164 lb 12.8 oz (74.8 kg)  11/02/22 161 lb 12.8 oz (73.4 kg)     Exam:    BP 120/70 (BP Location: Left Arm, Patient Position: Sitting, Cuff Size: Normal)   Pulse 78   Ht 6\' 1"  (1.854 m)   Wt 163 lb 6 oz (74.1 kg)   SpO2 96%   BMI 21.55 kg/m  Constitutional:   oriented to person, place, and time. No distress.  HENT:  Head: Grossly normal Eyes:  no discharge. No scleral icterus.  Neck: No JVD, no carotid bruits  Cardiovascular: Regular rate and rhythm, no murmurs appreciated Pulmonary/Chest: Clear to auscultation bilaterally, no wheezes or rales Abdominal: Soft.  no distension.  no tenderness.  Musculoskeletal: Normal range of motion Neurological:  normal muscle tone. Coordination normal. No atrophy Skin: Skin warm and dry Psychiatric: normal affect, pleasant   ASSESSMENT & PLAN:    Problem List Items Addressed This Visit       Cardiology Problems  HLD (hyperlipidemia)   Coronary artery calcification - Primary   Relevant Orders   EKG 12-Lead (Completed)     Other   Chronic low back pain with sciatica   Encounter for smoking cessation counseling   Diabetes mellitus without complication (HCC)   Other Visit Diagnoses       Chest pain of uncertain etiology       Relevant Orders   EKG 12-Lead (Completed)     Atypical chest pain Atypical in nature, no further workup needed Muscle/rib pain  Coronary calcification Calcium  score of 3, minimally elevated Additional CT scan images no aortic atherosclerosis Cholesterol at goal  Hyperlipidemia Tolerating Crestor  5 daily, and 10 mg daily   Smoking Quit smoking years ago, recently restarted, smoking cessation recommended  GERD On Nexium , as well as Pepcid for breakthrough Takes BC powder, discussed risk of GI upset and bleeding   Signed, Belva Boyden, MD  [facility] [address], [place], [place] [address]

## 2023-04-24 ENCOUNTER — Encounter: Payer: Self-pay | Admitting: Cardiovascular Disease

## 2023-04-24 ENCOUNTER — Ambulatory Visit: Payer: BC Managed Care – PPO | Attending: Cardiovascular Disease | Admitting: Cardiovascular Disease

## 2023-04-24 VITALS — BP 120/70 | HR 78 | Ht 73.0 in | Wt 163.4 lb

## 2023-04-24 DIAGNOSIS — E782 Mixed hyperlipidemia: Secondary | ICD-10-CM

## 2023-04-24 DIAGNOSIS — M5440 Lumbago with sciatica, unspecified side: Secondary | ICD-10-CM

## 2023-04-24 DIAGNOSIS — I2510 Atherosclerotic heart disease of native coronary artery without angina pectoris: Secondary | ICD-10-CM | POA: Diagnosis not present

## 2023-04-24 DIAGNOSIS — G8929 Other chronic pain: Secondary | ICD-10-CM

## 2023-04-24 DIAGNOSIS — E119 Type 2 diabetes mellitus without complications: Secondary | ICD-10-CM

## 2023-04-24 DIAGNOSIS — R079 Chest pain, unspecified: Secondary | ICD-10-CM | POA: Diagnosis not present

## 2023-04-24 DIAGNOSIS — Z716 Tobacco abuse counseling: Secondary | ICD-10-CM

## 2023-04-24 NOTE — Patient Instructions (Signed)

## 2023-04-25 ENCOUNTER — Encounter: Payer: Self-pay | Admitting: Family

## 2023-04-25 ENCOUNTER — Ambulatory Visit: Payer: BC Managed Care – PPO | Admitting: Family

## 2023-04-25 VITALS — BP 118/78 | HR 78 | Temp 97.4°F | Ht 73.0 in | Wt 162.0 lb

## 2023-04-25 DIAGNOSIS — N529 Male erectile dysfunction, unspecified: Secondary | ICD-10-CM | POA: Diagnosis not present

## 2023-04-25 DIAGNOSIS — M5440 Lumbago with sciatica, unspecified side: Secondary | ICD-10-CM | POA: Diagnosis not present

## 2023-04-25 DIAGNOSIS — R0789 Other chest pain: Secondary | ICD-10-CM | POA: Diagnosis not present

## 2023-04-25 DIAGNOSIS — H6692 Otitis media, unspecified, left ear: Secondary | ICD-10-CM | POA: Diagnosis not present

## 2023-04-25 DIAGNOSIS — M25562 Pain in left knee: Secondary | ICD-10-CM | POA: Diagnosis not present

## 2023-04-25 DIAGNOSIS — G8929 Other chronic pain: Secondary | ICD-10-CM

## 2023-04-25 DIAGNOSIS — M67922 Unspecified disorder of synovium and tendon, left upper arm: Secondary | ICD-10-CM | POA: Insufficient documentation

## 2023-04-25 MED ORDER — TRAMADOL HCL 50 MG PO TABS
ORAL_TABLET | ORAL | 2 refills | Status: DC
Start: 2023-04-25 — End: 2023-09-12

## 2023-04-25 MED ORDER — SILDENAFIL CITRATE 100 MG PO TABS: 50.0000 mg | ORAL_TABLET | Freq: Every day | ORAL | 3 refills | Status: DC | PRN

## 2023-04-25 NOTE — Assessment & Plan Note (Signed)
 Exam consistent with elbow tendinopathy , olecranon bursitis. Advised tylenol arthritis instead of Goodys powder or Ibuprofen. Icing. Exercises provided.

## 2023-04-25 NOTE — Assessment & Plan Note (Signed)
 Chronic, stable.  No medication changes made.  Continue Lyrica  150 mg 3 times daily, with additional 150mg  tablet prn. Max daily dose 600mg  total. Continue tramadol  50mg  qam, one tablet at lunch time, one  tablet at bedtime.  He will take additional tramadol   50mg  prn if needed once throughout the day. Maximum daily dose of tramadol  is 200mg /day.  He is taking Flexeril  5 mg nightly.  Follow-up in 3 months time.

## 2023-04-25 NOTE — Patient Instructions (Signed)
 Trial viagra  instead of cialis   You may increase nexium  to 40mg  twice daily temporarily ; refrain from Goodys powder and trial of Tylenol arthritis 650mg  instead.    Elbow Bursitis Rehab Ask your health care provider which exercises are safe for you. Do exercises exactly as told by your health care provider and adjust them as directed. It is normal to feel mild stretching, pulling, tightness, or discomfort as you do these exercises. Stop right away if you feel sudden pain or your pain gets worse. Do not begin these exercises until told by your health care provider. Stretching and range-of-motion exercises These exercises warm up your muscles and joints and improve the movement and flexibility of your elbow. The exercises also help to relieve pain and swelling. Elbow flexion, assisted  Stand or sit with your left / right arm at your side. Use your other hand to gently push your left / right hand toward your shoulder (assisted) while bending your elbow (flexion). Hold this position for __________ seconds. Slowly return your left / right arm to the starting position. Repeat __________ times. Complete this exercise __________ times a day. Elbow extension, assisted  Lie on your back in a comfortable position that allows you to relax your arm muscles. Place a folded towel under your left / right upper arm so that your elbow and shoulder are at the same height. Use your other arm to raise your left / right arm (assisted) until your elbow and hand do not rest on the bed or towel. Hold your left / right arm out straight with your other hand supporting it. Let the weight of your hand straighten your elbow (extension). You should feel a stretch on the inside of your elbow. Keep your arm and chest muscles relaxed. If directed, add a small wrist weight or hand weight to increase the stretch. Hold this position for __________ seconds. Slowly release the stretch and return to the starting position. Repeat  __________ times. Complete this exercise __________ times a day. Elbow flexion, active  Stand or sit with your left / right elbow bent and your palm facing in, toward your body. Bend your elbow as far as you can using only your arm muscles (active flexion). Hold this position for __________ seconds. Slowly return to the starting position. Repeat __________ times. Complete this exercise __________ times a day. Elbow extension, active  Stand or sit with your left / right elbow bent and your palm facing in, toward your body. Slowly straighten your elbow using only your arm muscles (active extension). Stop when you feel a gentle stretch at the front of your arm, or when your arm is straight. Hold this position for __________ seconds. Slowly return to the starting position. Repeat __________ times. Complete this exercise __________ times a day. Strengthening exercises These exercises build strength and endurance in your elbow. Endurance is the ability to use your muscles for a long time, even after they get tired. Elbow flexion, isometric  Stand or sit with your left / right arm at waist height. Your palm should face in, toward your body. Place your other hand on top of your left / right forearm. Gently push down while you resist with your left / right arm (isometric flexion). Use about 50% effort with both arms. You may be instructed to use more and more effort with your arms each week. Try not to let your left / right arm move during the exercise. Hold this position for __________ seconds. Let your muscles relax  completely before you repeat the exercise. Repeat __________ times. Complete this exercise __________ times a day. Elbow extension, isometric  Stand or sit with your left / right arm at waist height. Your palm should face in, toward your body. Place your other hand on the bottom of your left / right forearm. Gently push up while you resist with your left / right arm (isometric  extension). Use about 50% effort with both arms. You may be instructed to use more and more effort with your arms each week. Try not to let your left / right arm move during the exercise. Hold this position for __________ seconds. Let your muscles relax completely before you repeat the exercise. Repeat __________ times. Complete this exercise __________ times a day. Biceps curls  Sit on a stable chair without armrests, or stand up. Hold a _________ lb / kg weight in your left / right hand. Your palm should face out, away from your body, at the starting position. Bend your left / right elbow and move your hand up toward your shoulder. Keep your elbow at your side while you bend it. Slowly return to the starting position. Repeat __________ times. Complete this exercise __________ times a day. Triceps curls  Lie on your back. Hold a _________ lb / kg weight in your left / right hand. Bend your left / right elbow to a 90-degree angle (right angle), so the weight is in front of your face, over your chest, and your elbow is pointed up to the ceiling. Straighten your elbow, raising your hand toward the ceiling. Use your other hand to support your left / right upper arm and to keep it still. Slowly return to the starting position. Repeat __________ times. Complete this exercise __________ times a day. This information is not intended to replace advice given to you by your health care provider. Make sure you discuss any questions you have with your health care provider. Document Revised: 12/29/2020 Document Reviewed: 12/29/2020 Elsevier Patient Education  2024 [employer].

## 2023-04-25 NOTE — Assessment & Plan Note (Signed)
 HA with cialis . Trial of viagra .

## 2023-04-25 NOTE — Progress Notes (Signed)
 Assessment & Plan:  Impotence Assessment & Plan: HA with cialis . Trial of viagra .   Orders: -     Sildenafil  Citrate; Take 0.5-1 tablets (50-100 mg total) by mouth daily as needed for erectile dysfunction.  Dispense: 30 tablet; Refill: 3  Chronic low back pain with sciatica, sciatica laterality unspecified, unspecified back pain laterality Assessment & Plan: Chronic, stable.  No medication changes made.  Continue Lyrica  150 mg 3 times daily, with additional 150mg  tablet prn. Max daily dose 600mg  total. Continue tramadol  50mg  qam, one tablet at lunch time, one  tablet at bedtime.  He will take additional tramadol   50mg  prn if needed once throughout the day. Maximum daily dose of tramadol  is 200mg /day.  He is taking Flexeril  5 mg nightly.  Follow-up in 3 months time.  Orders: -     traMADol  HCl; TAKE TWO TABLETS BY MOUTH EVERY 12 HOURS PRN.  MAY TAKE ONE EXTRA TABLET FOR BREAKTHROUGH PAIN  Dispense: 150 tablet; Refill: 2  Tendinopathy of left elbow Assessment & Plan: Exam consistent with elbow tendinopathy , olecranon bursitis. Advised tylenol arthritis instead of Goodys powder or Ibuprofen. Icing. Exercises provided.     Left-sided chest wall pain Assessment & Plan: Symptom aggravated by ibuprofen and able to be reproduced. Suspect GERD v MS etiology.   Advised he may temporarily increased nexium  to 40mg  BID. Reiterated need for colonoscopy and EGD which he is very aware. He will call [facility] when ready to schedule.       Return precautions given.   Risks, benefits, and alternatives of the medications and treatment plan prescribed today were discussed, and patient expressed understanding.   Education regarding symptom management and diagnosis given to patient on AVS either electronically or printed.  No follow-ups on file.  Bascom Bossier, FNP  Subjective:    Patient ID: Philip Stephens, male    DOB: May 09, 1975, 48 y.o.   MRN: [id number]  CC: Philip Stephens is  a 48 y.o. male who presents today for follow up.   HPI:  Complains of left elbow medial and lateral pain x 1 month, improved.   Episodic left 4th and 5th finger numbness  No neck pain, numbness in upper arm or neck.   Right handed  Endorses elbow swelling.  No rash, trauma.  He leans on car window with left arm.   He had follow up with Dr Gollan yesterday without medication changes.     Cialis  caused a HA.   Low back pain is stable. Compliant with lyrica  and tramadol . He requests refill of tramadol   He endorses breakthrough episodic epigastric pain after ibuprofen. He can press on left side of chest and reproduce pain.  He is taking nexium  20mg  BID. He takes pepcid 20mg  every day prn. No associated SOB, left arm numbness, jaw pain.     Allergies: Penicillins and Prednisone  Current Outpatient Medications on File Prior to Visit  Medication Sig Dispense Refill   acetaminophen (TYLENOL) 650 MG CR tablet Take 650 mg by mouth every 8 (eight) hours as needed for pain.     Aspirin-Salicylamide-Caffeine (ARTHRITIS STRENGTH BC POWDER PO) Take by mouth daily as needed.     Cholecalciferol 25 MCG (1000 UT) capsule Take by mouth.     cyclobenzaprine  (FLEXERIL ) 10 MG tablet TAKE ONE-HALF TABLET BY MOUTH AT BEDTIME 30 tablet 1   esomeprazole  (NEXIUM ) 20 MG capsule TAKE 1 CAPSULE BY MOUTH 2 TIMES A DAY BEFORE A MEAL 180 capsule 1   ezetimibe  (ZETIA )  10 MG tablet TAKE 1 TABLET BY MOUTH DAILY 30 tablet 2   famotidine (PEPCID) 20 MG tablet Take 20 mg by mouth daily.     metoprolol  succinate (TOPROL -XL) 25 MG 24 hr tablet TAKE 1 TABLET BY MOUTH DAILY 30 tablet 2   Multiple Vitamin (MULTI-VITAMIN) tablet Take 1 tablet by mouth daily.     nicotine  (NICODERM CQ  - DOSED IN MG/24 HOURS) 21 mg/24hr patch Place 1 patch (21 mg total) onto the skin daily. 28 patch 1   pregabalin  (LYRICA ) 150 MG capsule TAKE 1 CAPSULE BY MOUTH 3 TIMES A DAY AND 1 EXTRA CAPSULE DAILY IF NEEDED FOR PAIN 120 capsule 2    rosuvastatin  (CRESTOR ) 5 MG tablet Take 1 tablet (5 mg total) by mouth daily. 90 tablet 3   sucralfate  (CARAFATE ) 1 g tablet Take 1 tablet (1 g total) by mouth 3 (three) times daily as needed. 90 tablet 0   No current facility-administered medications on file prior to visit.    Review of Systems  Constitutional:  Negative for chills and fever.  Respiratory:  Negative for cough and shortness of breath.   Cardiovascular:  Negative for chest pain and palpitations.  Gastrointestinal:  Negative for nausea and vomiting.  Musculoskeletal:  Positive for arthralgias (left elbow) and back pain.      Objective:    BP 118/78   Pulse 78   Temp (!) 97.4 F (36.3 C) (Oral)   Ht 6\' 1"  (1.854 m)   Wt 162 lb (73.5 kg)   SpO2 97%   BMI 21.37 kg/m  BP Readings from Last 3 Encounters:  04/25/23 118/78  04/24/23 120/70  01/24/23 118/66   Wt Readings from Last 3 Encounters:  04/25/23 162 lb (73.5 kg)  04/24/23 163 lb 6 oz (74.1 kg)  01/24/23 164 lb 12.8 oz (74.8 kg)    Physical Exam Vitals reviewed.  Constitutional:      Appearance: He is well-developed.  Cardiovascular:     Rate and Rhythm: Regular rhythm.     Heart sounds: Normal heart sounds.  Pulmonary:     Effort: Pulmonary effort is normal. No respiratory distress.     Breath sounds: Normal breath sounds. No wheezing, rhonchi or rales.  Musculoskeletal:     Right elbow: Swelling present. Normal range of motion. No tenderness.     Left elbow: Swelling present. Normal range of motion. Tenderness present in medial epicondyle and lateral epicondyle.     Cervical back: No torticollis. No spinous process tenderness or muscular tenderness.     Comments: Subtle swelling over left and right olecranon process. Localized tenderness over the left lateral and medial epicondyle. Pain with resisted wrist extension with the left elbow in full extension No rash, erythema, warmth.  Full ROM with flexion, extension, supination, and pronation.   Strength 5/5. No ecchymosis or swelling.   Skin:    General: Skin is warm and dry.  Neurological:     Mental Status: He is alert.  Psychiatric:        Speech: Speech normal.        Behavior: Behavior normal.

## 2023-04-25 NOTE — Assessment & Plan Note (Signed)
 Symptom aggravated by ibuprofen and able to be reproduced. Suspect GERD v MS etiology.   Advised he may temporarily increased nexium  to 40mg  BID. Reiterated need for colonoscopy and EGD which he is very aware. He will call [facility] when ready to schedule.

## 2023-05-15 DIAGNOSIS — M25562 Pain in left knee: Secondary | ICD-10-CM | POA: Diagnosis not present

## 2023-05-20 ENCOUNTER — Other Ambulatory Visit: Payer: Self-pay | Admitting: Family

## 2023-06-08 ENCOUNTER — Encounter: Payer: Self-pay | Admitting: Dermatology

## 2023-06-08 ENCOUNTER — Ambulatory Visit: Admitting: Dermatology

## 2023-06-08 DIAGNOSIS — L578 Other skin changes due to chronic exposure to nonionizing radiation: Secondary | ICD-10-CM | POA: Diagnosis not present

## 2023-06-08 DIAGNOSIS — L814 Other melanin hyperpigmentation: Secondary | ICD-10-CM

## 2023-06-08 DIAGNOSIS — W908XXA Exposure to other nonionizing radiation, initial encounter: Secondary | ICD-10-CM

## 2023-06-08 DIAGNOSIS — L570 Actinic keratosis: Secondary | ICD-10-CM

## 2023-06-08 DIAGNOSIS — B353 Tinea pedis: Secondary | ICD-10-CM

## 2023-06-08 DIAGNOSIS — D229 Melanocytic nevi, unspecified: Secondary | ICD-10-CM

## 2023-06-08 DIAGNOSIS — Z1283 Encounter for screening for malignant neoplasm of skin: Secondary | ICD-10-CM | POA: Diagnosis not present

## 2023-06-08 DIAGNOSIS — L821 Other seborrheic keratosis: Secondary | ICD-10-CM

## 2023-06-08 DIAGNOSIS — B351 Tinea unguium: Secondary | ICD-10-CM

## 2023-06-08 DIAGNOSIS — D1801 Hemangioma of skin and subcutaneous tissue: Secondary | ICD-10-CM

## 2023-06-08 NOTE — Progress Notes (Signed)
 New Patient Visit   Subjective  Philip Stephens is a 48 y.o. male who presents for the following: Skin Cancer Screening and Full Body Skin Exam  Patient denies personal or family history of skin cancer.   Patient is concerned with a spot at scalp he noticed 2 years ago. Also reports when out in sun breakouts in little red itchy bumps at arms and ears would like to discuss. Patient states that bumps started last summer.  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 The patient has spots, moles and lesions to be evaluated, some may be new or changing and the patient may have concern these could be cancer.    The following portions of the chart were reviewed this encounter and updated as appropriate: medications, allergies, medical history  Review of Systems:  No other skin or systemic complaints except as noted in HPI or Assessment and Plan.  Objective  Well appearing patient in no apparent distress; mood and affect are within normal limits.  A full examination was performed including scalp, head, eyes, ears, nose, lips, neck, chest, axillae, abdomen, back, buttocks, bilateral upper extremities, bilateral lower extremities, hands, feet, fingers, toes, fingernails, and toenails. All findings within normal limits unless otherwise noted below.   Relevant physical exam findings are noted in the Assessment and Plan.  left superior helix, right superior helix Erythematous thin papules/macules with gritty scale.  b/l ears arms hands Pink scaly papules scattered on forearms dorsal hands Xerosis on palms  Assessment & Plan   SKIN CANCER SCREENING PERFORMED TODAY.  ACTINIC DAMAGE - Chronic condition, secondary to cumulative UV/sun exposure - diffuse scaly erythematous macules with underlying dyspigmentation - Recommend daily broad  spectrum sunscreen SPF 30+ to sun-exposed areas, reapply every 2 hours as needed.  - Staying in the shade or wearing long sleeves, sun glasses (UVA+UVB protection) and wide brim hats (4-inch brim around the entire circumference of the hat) are also recommended for sun protection.  - Call for new or changing lesions.  LENTIGINES, SEBORRHEIC KERATOSES, HEMANGIOMAS - Benign normal skin lesions - Benign-appearing - Call for any changes SKs at scalp - discussed Ln2 vs shv removal - patient declined treatment today - patient will make an appointment to come back and have treated   MELANOCYTIC NEVI - Tan-brown and/or pink-flesh-colored symmetric macules and papules - Benign appearing on exam today - Observation - Call clinic for new or changing moles - Recommend daily use of broad spectrum spf 30+ sunscreen to sun-exposed areas.   ONYCHOMYCOSIS tinea pedis Exam: Thickened toenails with subungal debris c/w onychomycosis. Red scaly plaques plantar feet and between toes  Chronic and persistent condition with duration or expected duration over one year. Condition is symptomatic/ bothersome to patient. Not currently at goal.   Treatment Plan:  Discussed 3 months course of Terbinafine Terbinafine Counseling  Terbinafine is an anti-fungal medicine that can be applied to the skin (over the counter) or taken by mouth (prescription) to treat fungal infections. The pill version is often used to treat fungal infections of the nails or scalp. While most people do not have any side effects from taking terbinafine pills, some possible side effects of the medicine can include taste changes, headache, loss of smell, vision changes, nausea, vomiting, or diarrhea.   Rare side effects can include irritation of the liver, allergic reaction, or  decrease in blood counts (which may show up as not feeling well or developing an infection). If you are concerned about any of these side effects, please stop the medicine and  call your doctor, or in the case of an emergency such as feeling very unwell, seek immediate medical care.   Patient defers treatment today  ACTINIC KERATOSIS left superior helix, right superior helix Patient defers treatment today - will treat in future  Actinic keratoses are precancerous spots that appear secondary to cumulative UV radiation exposure/sun exposure over time. They are chronic with expected duration over 1 year. A portion of actinic keratoses will progress to squamous cell carcinoma of the skin. It is not possible to reliably predict which spots will progress to skin cancer and so treatment is recommended to prevent development of skin cancer.  Recommend daily broad spectrum sunscreen SPF 30+ to sun-exposed areas, reapply every 2 hours as needed.  Recommend staying in the shade or wearing long sleeves, sun glasses (UVA+UVB protection) and wide brim hats (4-inch brim around the entire circumference of the hat). Call for new or changing lesions. 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 Favour CAD over PMLE given that rash persists throughout the summer Recommend wide brim hats, long sleeves,  and spf 50 when out in sun MULTIPLE BENIGN NEVI   ACTINIC ELASTOSIS   LENTIGINES   SEBORRHEIC KERATOSES   CHERRY ANGIOMA   TINEA PEDIS OF BOTH FEET   ONYCHOMYCOSIS    Return in about 1 year (around 06/07/2024) for TBSE.  I, Randee Busing, CMA, am acting as scribe for Harris Liming, MD.   Documentation: I have reviewed the above documentation for accuracy and completeness, and I agree with the above.  Harris Liming, MD

## 2023-06-08 NOTE — Patient Instructions (Addendum)
 Actinic keratoses are precancerous spots that appear secondary to cumulative UV radiation exposure/sun exposure over time. They are chronic with expected duration over 1 year. A portion of actinic keratoses will progress to squamous cell carcinoma of the skin. It is not possible to reliably predict which spots will progress to skin cancer and so treatment is recommended to prevent development of skin cancer.  Recommend daily broad spectrum sunscreen SPF 30+ to sun-exposed areas, reapply every 2 hours as needed.  Recommend staying in the shade or wearing long sleeves, sun glasses (UVA+UVB protection) and wide brim hats (4-inch brim around the entire circumference of the hat). Call for new or changing lesions.    Seborrheic Keratosis  What causes seborrheic keratoses? Seborrheic keratoses are harmless, common skin growths that first appear during adult life.  As time goes by, more growths appear.  Some people may develop a large number of them.  Seborrheic keratoses appear on both covered and uncovered body parts.  They are not caused by sunlight.  The tendency to develop seborrheic keratoses can be inherited.  They vary in color from skin-colored to gray, brown, or even black.  They can be either smooth or have a rough, warty surface.   Seborrheic keratoses are superficial and look as if they were stuck on the skin.  Under the microscope this type of keratosis looks like layers upon layers of skin.  That is why at times the top layer may seem to fall off, but the rest of the growth remains and re-grows.    Treatment Seborrheic keratoses do not need to be treated, but can easily be removed in the office.  Seborrheic keratoses often cause symptoms when they rub on clothing or jewelry.  Lesions can be in the way of shaving.  If they become inflamed, they can cause itching, soreness, or burning.  Removal of a seborrheic keratosis can be accomplished by freezing, burning, or surgery. If any spot bleeds,  scabs, or grows rapidly, please return to have it checked, as these can be an indication of a skin cancer.   Melanoma ABCDEs  Melanoma is the most dangerous type of skin cancer, and is the leading cause of death from skin disease.  You are more likely to develop melanoma if you: Have light-colored skin, light-colored eyes, or red or blond hair Spend a lot of time in the sun Tan regularly, either outdoors or in a tanning bed Have had blistering sunburns, especially during childhood Have a close family member who has had a melanoma Have atypical moles or large birthmarks  Early detection of melanoma is key since treatment is typically straightforward and cure rates are extremely high if we catch it early.   The first sign of melanoma is often a change in a mole or a new dark spot.  The ABCDE system is a way of remembering the signs of melanoma.  A for asymmetry:  The two halves do not match. B for border:  The edges of the growth are irregular. C for color:  A mixture of colors are present instead of an even brown color. D for diameter:  Melanomas are usually (but not always) greater than 6mm - the size of a pencil eraser. E for evolution:  The spot keeps changing in size, shape, and color.  Please check your skin once per month between visits. You can use a small mirror in front and a large mirror behind you to keep an eye on the back side or your  body.   If you see any new or changing lesions before your next follow-up, please call to schedule a visit.  Please continue daily skin protection including broad spectrum sunscreen SPF 30+ to sun-exposed areas, reapplying every 2 hours as needed when you're outdoors.   Staying in the shade or wearing long sleeves, sun glasses (UVA+UVB protection) and wide brim hats (4-inch brim around the entire circumference of the hat) are also recommended for sun protection.    Due to recent changes in healthcare laws, you may see results of your pathology  and/or laboratory studies on MyChart before the doctors have had a chance to review them. We understand that in some cases there may be results that are confusing or concerning to you. Please understand that not all results are received at the same time and often the doctors may need to interpret multiple results in order to provide you with the best plan of care or course of treatment. Therefore, we ask that you please give [place] 2 business days to thoroughly review all your results before contacting the office for clarification. Should we see a critical lab result, you will be contacted sooner.   If You Need Anything After Your Visit  If you have any questions or concerns for your doctor, please call our main line at [phone] and press option 4 to reach your doctor's medical assistant. If no one answers, please leave a voicemail as directed and we will return your call as soon as possible. Messages left after 4 pm will be answered the following business day.   You may also send [place] a message via MyChart. We typically respond to MyChart messages within 1-2 business days.  For prescription refills, please ask your pharmacy to contact our office. Our fax number is [phone].  If you have an urgent issue when the clinic is closed that cannot wait until the next business day, you can page your doctor at the number below.    Please note that while we do our best to be available for urgent issues outside of office hours, we are not available 24/7.   If you have an urgent issue and are unable to reach [place], you may choose to seek medical care at your doctor's office, retail clinic, urgent care center, or emergency room.  If you have a medical emergency, please immediately call 911 or go to the emergency department.  Pager Numbers  - Dr. Gwen Pounds: [phone]  - Dr. Roseanne Reno: [phone]  - Dr. Katrinka Blazing: [phone]   In the event of inclement weather, please call our main line at [phone]  for an update on the status of any delays or closures.  Dermatology Medication Tips: Please keep the boxes that topical medications come in in order to help keep track of the instructions about where and how to use these. Pharmacies typically print the medication instructions only on the boxes and not directly on the medication tubes.   If your medication is too expensive, please contact our office at [phone] option 4 or send [place] a message through MyChart.   We are unable to tell what your co-pay for medications will be in advance as this is different depending on your insurance coverage. However, we may be able to find a substitute medication at lower cost or fill out paperwork to get insurance to cover a needed medication.   If a prior authorization is required to get your medication covered by your insurance company, please allow [place] 1-2 business days to  complete this process.  Drug prices often vary depending on where the prescription is filled and some pharmacies may offer cheaper prices.  The website www.goodrx.com contains coupons for medications through different pharmacies. The prices here do not account for what the cost may be with help from insurance (it may be cheaper with your insurance), but the website can give you the price if you did not use any insurance.  - You can print the associated coupon and take it with your prescription to the pharmacy.  - You may also stop by our office during regular business hours and pick up a GoodRx coupon card.  - If you need your prescription sent electronically to a different pharmacy, notify our office through [facility] or by phone at [phone] option 4.     Si Usted Necesita Algo Despus de Su Visita  Tambin puede enviarnos un mensaje a travs de Clinical cytogeneticist. Por lo general respondemos a los mensajes de MyChart en el transcurso de 1 a 2 das hbiles.  Para renovar recetas, por favor pida a su farmacia que se ponga en contacto  con nuestra oficina. Annie Sable de fax es [place] [phone].  Si tiene un asunto urgente cuando la clnica est cerrada y que no puede esperar hasta el siguiente da hbil, puede llamar/localizar a su doctor(a) al nmero que aparece a continuacin.   Por favor, tenga en cuenta que aunque hacemos todo lo posible para estar disponibles para asuntos urgentes fuera del horario de [place], no estamos disponibles las 24 horas del da, los 7 [address]  [address] de la [place].   Si tiene un problema urgente y no puede comunicarse con nosotros, puede optar por buscar atencin mdica  en el consultorio de su doctor(a), en una clnica privada, en un centro de atencin urgente o en una sala de emergencias.  Si tiene Engineer, drilling, por favor llame inmediatamente al 911 o vaya a la sala de emergencias.  Nmeros de bper  - Dr. Gwen Pounds: [phone]  - Dra. Roseanne Reno: [phone]  - Dr. Katrinka Blazing: [phone]   En caso de inclemencias del tiempo, por favor llame a Lacy Duverney principal al [phone] para una actualizacin sobre el [place] de cualquier retraso o cierre.  Consejos para la medicacin en dermatologa: Por favor, guarde las cajas en las que vienen los medicamentos de uso tpico para ayudarle a seguir las instrucciones sobre dnde y cmo usarlos. Las farmacias generalmente imprimen las instrucciones del medicamento slo en las cajas y no directamente en los tubos del [place].   Si su medicamento es muy caro, por favor, pngase en contacto con Rolm Gala llamando al [phone] y presione la opcin 4 o envenos un mensaje a travs de Clinical cytogeneticist.   No podemos decirle cul ser su copago por los medicamentos por adelantado ya que esto es diferente dependiendo de la cobertura de su seguro. Sin embargo, es posible que podamos encontrar un medicamento sustituto a Audiological scientist un formulario para que el seguro cubra el medicamento que se considera necesario.   Si se requiere una autorizacin  previa para que su compaa de seguros [place] su medicamento, por favor permtanos de 1 a 2 das hbiles para completar [address]  Los precios de los medicamentos varan con frecuencia dependiendo del Environmental consultant de dnde se surte la receta y alguna farmacias pueden ofrecer precios ms baratos.  El sitio web www.goodrx.com tiene cupones para medicamentos de Health and safety inspector. Los precios aqu no tienen en cuenta lo que podra costar con la [employer]  del seguro (puede ser ms barato con su seguro), pero el sitio web puede darle el precio si no Visual merchandiser.  - Puede imprimir el cupn correspondiente y llevarlo con su receta a la farmacia.  - Tambin puede pasar por nuestra oficina durante el horario de atencin regular y Education officer, museum una tarjeta de cupones de GoodRx.  - Si necesita que su receta se enve electrnicamente a una farmacia diferente, informe a nuestra oficina a travs de MyChart de [place] o por telfono llamando al [phone] y presione la opcin 4.

## 2023-06-15 ENCOUNTER — Other Ambulatory Visit: Payer: Self-pay | Admitting: Family

## 2023-06-15 DIAGNOSIS — M545 Low back pain, unspecified: Secondary | ICD-10-CM

## 2023-06-15 MED ORDER — PREGABALIN 150 MG PO CAPS
ORAL_CAPSULE | ORAL | 2 refills | Status: DC
Start: 2023-06-15 — End: 2023-09-12

## 2023-06-19 ENCOUNTER — Other Ambulatory Visit: Payer: Self-pay | Admitting: Family

## 2023-06-19 DIAGNOSIS — K219 Gastro-esophageal reflux disease without esophagitis: Secondary | ICD-10-CM

## 2023-06-23 NOTE — Telephone Encounter (Signed)
 Spoke to pt he went through his job and had a Teledoc visit and they prescribed him Doxcycline

## 2023-06-29 ENCOUNTER — Telehealth: Admitting: Family

## 2023-06-29 VITALS — Ht 73.0 in | Wt 162.0 lb

## 2023-06-29 DIAGNOSIS — R519 Headache, unspecified: Secondary | ICD-10-CM | POA: Diagnosis not present

## 2023-06-29 NOTE — Progress Notes (Signed)
 Virtual Visit via Video Note  I connected with Philip Stephens on 06/29/23 at  2:30 PM EDT by a video enabled telemedicine application and verified that I am speaking with the correct person using two identifiers. Location patient: home Location provider: work  Persons participating in the virtual visit: patient, provider  I discussed the limitations of evaluation and management by telemedicine and the availability of in person appointments. The patient expressed understanding and agreed to proceed.  HPI:  Complains frontal HA x 3 weeks ago, resolved today.  3 days in a row he had a HA.  He had some nausea without vomiting.   HA resolved this week; Drinking more water last 2-3 days.  He endorses work stress and thinks contributory. He is on vacation this week from work.   No associated numbness, vision changes, pulsatile tinnitus.  Chronic bl tinnitus.  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    Excredin migraine with some relief. He took excredin migraine for 3 days, finally HA resolved.   Sleep helps HA.  HA is not worse HA of life. HA is not positional.   He drinks 12 oz cup coffee caffeine, and also 3 pepsi's per day.  He takes [facility] powder with caffeine day ( 65mg  caffeine)  Eye exam is UTD  He reports a 'h/o migraine' 2-3 per year.   No alcohol use.    ROS: See pertinent positives and negatives per HPI.  EXAM:  VITALS per patient if applicable: Ht 6' 1 (1.854 m)   Wt 162 lb (73.5 kg)   BMI 21.37 kg/m  BP Readings from Last 3 Encounters:  04/25/23 118/78  04/24/23 120/70  01/24/23 118/66   Wt Readings from Last 3 Encounters:  06/29/23 162 lb (73.5 kg)  04/25/23 162 lb (73.5 kg)  04/24/23 163 lb 6 oz (74.1 kg)    GENERAL: alert, oriented, appears well and in no acute distress  HEENT: atraumatic, conjunttiva clear, no obvious abnormalities on inspection of external nose and ears  NECK: normal movements of the  head and neck  LUNGS: on inspection no signs of respiratory distress, breathing rate appears normal, no obvious gross SOB, gasping or wheezing  CV: no obvious cyanosis  MS: moves all visible extremities without noticeable abnormality  PSYCH/NEURO: pleasant and cooperative, no obvious depression or anxiety, speech and thought processing grossly intact  ASSESSMENT AND PLAN: Nonintractable headache, unspecified chronicity pattern, unspecified headache type Assessment & Plan: Resolved.  Discussed medication overuse HA ( daily goodys powder), caffeine use ( 299 mg + of caffeine daily).  He is under significant stress.  Start mag citrate 400mg  every day. Advised trial of melatonin combination OTC on AVS. Close follow up.       -we discussed possible serious and likely etiologies, options for evaluation and workup, limitations of telemedicine visit vs in person visit, treatment, treatment risks and precautions. Pt prefers to treat via telemedicine empirically rather then risking or undertaking an in person visit at this moment.    I discussed the assessment and treatment plan with the patient. The patient was provided an opportunity to ask questions and all were answered. The patient agreed with the plan and demonstrated an understanding of the instructions.   The patient was advised to call back or seek an in-person evaluation if the symptoms worsen or if the condition fails to improve as anticipated.  Advised if desired AVS can be mailed or viewed  via MyChart if Mychart user.   Rollene Northern, FNP

## 2023-06-29 NOTE — Patient Instructions (Addendum)
 Start magnesium  citrate 400mg  daily for headache prevention/sleep  Please start to decrease caffeine, slowly over time.    You may also trial melatonin combination over the counter supplement such as Sleep#3 ( L- theanine, camomile, lavender, valerian root, and melatonin 10mg )   or Qunol Sleep 5 in 1 (melatonin 5mg , Ashwagandha, GABA, valerian root, L-theanine)    Please let me know how you are doing.

## 2023-06-29 NOTE — Assessment & Plan Note (Signed)
 Resolved.  Discussed medication overuse HA ( daily goodys powder), caffeine use ( 299 mg + of caffeine daily).  He is under significant stress.  Start mag citrate 400mg  every day. Advised trial of melatonin combination OTC on AVS. Close follow up.

## 2023-07-05 ENCOUNTER — Other Ambulatory Visit: Payer: Self-pay

## 2023-07-05 DIAGNOSIS — E119 Type 2 diabetes mellitus without complications: Secondary | ICD-10-CM

## 2023-07-17 ENCOUNTER — Other Ambulatory Visit: Payer: Self-pay | Admitting: Family

## 2023-07-17 DIAGNOSIS — M542 Cervicalgia: Secondary | ICD-10-CM

## 2023-08-13 ENCOUNTER — Encounter: Payer: Self-pay | Admitting: Family

## 2023-08-14 ENCOUNTER — Other Ambulatory Visit: Payer: Self-pay | Admitting: Family

## 2023-08-14 DIAGNOSIS — G8929 Other chronic pain: Secondary | ICD-10-CM

## 2023-08-14 NOTE — Telephone Encounter (Signed)
 Copied from CRM [id number]. Topic: Clinical - Medication Question >> Aug 14, 2023  4:02 PM Abigail D wrote: Reason for CRM: Patient is calling for an update on his medications, advised that he should be hearing back within the next 24 hours. He is requesting a call back from Margaret's nurse if possible.

## 2023-08-15 ENCOUNTER — Other Ambulatory Visit: Payer: Self-pay

## 2023-08-15 ENCOUNTER — Other Ambulatory Visit: Payer: Self-pay | Admitting: Family

## 2023-08-15 DIAGNOSIS — G8929 Other chronic pain: Secondary | ICD-10-CM

## 2023-08-17 ENCOUNTER — Other Ambulatory Visit

## 2023-08-17 ENCOUNTER — Ambulatory Visit: Admitting: Family

## 2023-08-17 ENCOUNTER — Encounter: Payer: Self-pay | Admitting: Family

## 2023-08-17 ENCOUNTER — Telehealth (INDEPENDENT_AMBULATORY_CARE_PROVIDER_SITE_OTHER): Admitting: Family

## 2023-08-17 VITALS — Ht 73.0 in | Wt 162.1 lb

## 2023-08-17 DIAGNOSIS — M5440 Lumbago with sciatica, unspecified side: Secondary | ICD-10-CM | POA: Diagnosis not present

## 2023-08-17 DIAGNOSIS — Z125 Encounter for screening for malignant neoplasm of prostate: Secondary | ICD-10-CM

## 2023-08-17 DIAGNOSIS — I2510 Atherosclerotic heart disease of native coronary artery without angina pectoris: Secondary | ICD-10-CM

## 2023-08-17 DIAGNOSIS — K219 Gastro-esophageal reflux disease without esophagitis: Secondary | ICD-10-CM

## 2023-08-17 DIAGNOSIS — E119 Type 2 diabetes mellitus without complications: Secondary | ICD-10-CM | POA: Diagnosis not present

## 2023-08-17 DIAGNOSIS — G8929 Other chronic pain: Secondary | ICD-10-CM

## 2023-08-17 DIAGNOSIS — Z13220 Encounter for screening for lipoid disorders: Secondary | ICD-10-CM

## 2023-08-17 NOTE — Assessment & Plan Note (Signed)
 He has lost weight. Pending a1c
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
## 2023-08-17 NOTE — Assessment & Plan Note (Signed)
 Chronic, stable.  No medication changes made.  Continue Lyrica  150 mg 3 times daily, with additional 150mg  tablet prn. Max daily dose 600mg  total. Continue tramadol  50mg  qam, one tablet at lunch time, one  tablet at bedtime.  He will take additional tramadol   50mg  prn if needed once throughout the day. Maximum daily dose of tramadol  is 200mg /day.  He is taking Flexeril  5 mg nightly.  Follow-up in 3 months time. He will schedule annual urine drug screen.

## 2023-08-17 NOTE — Assessment & Plan Note (Signed)
 Chronic , stable. Continue crestor  5mg  , zetia  10mg  qd

## 2023-08-17 NOTE — Assessment & Plan Note (Signed)
 Chronic, stable. Reiterated the importance of follow-up with gastroenterology for endoscopy, colonoscopy.  Continue Nexium  20 mg twice daily prn, Pepcid prn, and Carafate  prn

## 2023-08-20 ENCOUNTER — Telehealth: Payer: Self-pay | Admitting: Family

## 2023-08-20 ENCOUNTER — Other Ambulatory Visit: Payer: Self-pay | Admitting: Family

## 2023-08-20 DIAGNOSIS — G8929 Other chronic pain: Secondary | ICD-10-CM

## 2023-08-20 NOTE — Telephone Encounter (Signed)
 Please remove lab

## 2023-08-31 ENCOUNTER — Encounter: Payer: Self-pay | Admitting: Family

## 2023-09-11 ENCOUNTER — Ambulatory Visit: Admitting: Family

## 2023-09-11 ENCOUNTER — Other Ambulatory Visit: Payer: Self-pay | Admitting: Family

## 2023-09-11 DIAGNOSIS — G8929 Other chronic pain: Secondary | ICD-10-CM

## 2023-09-11 NOTE — Telephone Encounter (Signed)
 Lyrica  refilled: 06/15/2023 Tramadol  refilled: 04/25/2023 Last OV: 08/17/2023 Next OV: not scheduled

## 2023-09-13 ENCOUNTER — Telehealth: Admitting: Physician Assistant

## 2023-09-13 DIAGNOSIS — U071 COVID-19: Secondary | ICD-10-CM

## 2023-09-13 MED ORDER — NIRMATRELVIR/RITONAVIR (PAXLOVID)TABLET: 3.0000 | ORAL_TABLET | Freq: Two times a day (BID) | ORAL | 0 refills | Status: AC

## 2023-09-13 MED ORDER — BENZONATATE 100 MG PO CAPS: 100.0000 mg | ORAL_CAPSULE | Freq: Three times a day (TID) | ORAL | 0 refills | Status: DC | PRN

## 2023-09-13 NOTE — Patient Instructions (Signed)
 Philip Stephens, thank you for joining [name], PA-C for today's virtual visit.  While this provider is not your primary care provider (PCP), if your PCP is located in our provider database this encounter information will be shared with them immediately following your visit.   A [facility] MyChart account gives you access to today's visit and all your visits, tests, and labs performed at [facility]  click here if you don't have a [facility] MyChart account or go to [email or web address]  Consent: (Patient) Philip Stephens provided verbal consent for this virtual visit at the beginning of the encounter.  Current Medications:  Current Outpatient Medications:    benzonatate  (TESSALON ) 100 MG capsule, Take 1 capsule (100 mg total) by mouth 3 (three) times daily as needed for cough., Disp: 30 capsule, Rfl: 0   nirmatrelvir /ritonavir  (PAXLOVID ) 20 x 150 MG & 10 x 100MG  TABS, Take 3 tablets by mouth 2 (two) times daily for 5 days. (Take nirmatrelvir  150 mg two tablets twice daily for 5 days and ritonavir  100 mg one tablet twice daily for 5 days) Patient GFR is > 93., Disp: 30 tablet, Rfl: 0   acetaminophen (TYLENOL) 650 MG CR tablet, Take 650 mg by mouth every 8 (eight) hours as needed for pain., Disp: , Rfl:    Aspirin-Salicylamide-Caffeine (ARTHRITIS STRENGTH BC POWDER PO), Take by mouth daily as needed., Disp: , Rfl:    Cholecalciferol 25 MCG (1000 UT) capsule, Take by mouth., Disp: , Rfl:    cyclobenzaprine  (FLEXERIL ) 10 MG tablet, TAKE 1/2 TABLET BY MOUTH AT BEDTIME, Disp: 30 tablet, Rfl: 3   esomeprazole  (NEXIUM ) 20 MG capsule, TAKE 1 CAPSULE BY MOUTH 2 TIMES A DAY BEFORE A MEAL, Disp: 180 capsule, Rfl: 1   ezetimibe  (ZETIA ) 10 MG tablet, TAKE 1 TABLET BY MOUTH DAILY, Disp: 90 tablet, Rfl: 3   metoprolol  succinate (TOPROL -XL) 25 MG 24 hr tablet, TAKE 1 TABLET BY MOUTH DAILY, Disp: 90 tablet, Rfl: 1   Multiple Vitamin (MULTI-VITAMIN) tablet, Take 1 tablet by mouth  daily., Disp: , Rfl:    pregabalin  (LYRICA ) 150 MG capsule, TAKE 1 CAPSULE BY MOUTH 3 TIMES A DAY, MAY TAKE 1 EXTRA CAPSULE A DAY AS NEEDED FOR PAIN, Disp: 120 capsule, Rfl: 2   rosuvastatin  (CRESTOR ) 5 MG tablet, TAKE 1 TABLET BY MOUTH DAILY, Disp: 90 tablet, Rfl: 3   sucralfate  (CARAFATE ) 1 g tablet, TAKE 1 TABLET BY MOUTH 3 TIMES A DAY, Disp: 90 tablet, Rfl: 0   traMADol  (ULTRAM ) 50 MG tablet, TAKE 2 TABLETS BY MOUTH EVERY 12 HOURS AS NEEDED , MAY TAKE AN ADDITIONAL TABLET FOR BREAKTHROUGH PAIN, Disp: 150 tablet, Rfl: 2   Medications ordered in this encounter:  Meds ordered this encounter  Medications   nirmatrelvir /ritonavir  (PAXLOVID ) 20 x 150 MG & 10 x 100MG  TABS    Sig: Take 3 tablets by mouth 2 (two) times daily for 5 days. (Take nirmatrelvir  150 mg two tablets twice daily for 5 days and ritonavir  100 mg one tablet twice daily for 5 days) Patient GFR is > 93.    Dispense:  30 tablet    Refill:  0    Supervising Provider:   LAMPTEY, PHILIP O [[id number]]   benzonatate  (TESSALON ) 100 MG capsule    Sig: Take 1 capsule (100 mg total) by mouth 3 (three) times daily as needed for cough.    Dispense:  30 capsule    Refill:  0    Supervising Provider:   BLAISE,  ALEENE KIDD [[id number]]     *If you need refills on other medications prior to your next appointment, please contact your pharmacy*  Follow-Up: Call back or seek an in-person evaluation if the symptoms worsen or if the condition fails to improve as anticipated.  [facility] [phone]  Care Instructions: Please keep well-hydrated and get plenty of rest. Start a saline nasal rinse to flush out your nasal passages. You can use plain Mucinex  to help thin congestion. If you have to start the Paxlovid  -- make sure to refrain from taking your Crestor  until the course is complete. If you have a humidifier, running in the bedroom at night. I want you to start OTC vitamin D3 1000 units daily, vitamin C 1000 mg daily, and a  zinc supplement. Please take prescribed medications as directed.      Isolation Instructions: You are to isolate at home until you have been fever free for at least 24 hours without a fever-reducing medication, and symptoms have been steadily improving for 24 hours. At that time,  you can end isolation but need to mask for an additional 5 days.   If you must be around other household members who do not have symptoms, you need to make sure that both you and the family members are masking consistently with a high-quality mask.  If you note any worsening of symptoms despite treatment, please seek an in-person evaluation ASAP. If you note any significant shortness of breath or any chest pain, please seek ER evaluation. Please do not delay care!   COVID-19: What to Do if You Are Sick If you test positive and are an older adult or someone who is at high risk of getting very sick from COVID-19, treatment may be available. Contact a healthcare provider right away after a positive test to determine if you are eligible, even if your symptoms are mild right now. You can also visit a Test to Treat location and, if eligible, receive a prescription from a provider. Don't delay: Treatment must be started within the first few days to be effective. If you have a fever, cough, or other symptoms, you might have COVID-19. Most people have mild illness and are able to recover at home. If you are sick: Keep track of your symptoms. If you have an emergency warning sign (including trouble breathing), call 911. Steps to help prevent the spread of COVID-19 if you are sick If you are sick with COVID-19 or think you might have COVID-19, follow the steps below to care for yourself and to help protect other people in your home and community. Stay home except to get medical care Stay home. Most people with COVID-19 have mild illness and can recover at home without medical care. Do not leave your home, except to get medical  care. Do not visit public areas and do not go to places where you are unable to wear a mask. Take care of yourself. Get rest and stay hydrated. Take over-the-counter medicines, such as acetaminophen, to help you feel better. Stay in touch with your doctor. Call before you get medical care. Be sure to get care if you have trouble breathing, or have any other emergency warning signs, or if you think it is an emergency. Avoid public transportation, ride-sharing, or taxis if possible. Get tested If you have symptoms of COVID-19, get tested. While waiting for test results, stay away from others, including staying apart from those living in your household. Get tested as soon as  possible after your symptoms start. Treatments may be available for people with COVID-19 who are at risk for becoming very sick. Don't delay: Treatment must be started early to be effective--some treatments must begin within 5 days of your first symptoms. Contact your healthcare provider right away if your test result is positive to determine if you are eligible. Self-tests are one of several options for testing for the virus that causes COVID-19 and may be more convenient than laboratory-based tests and point-of-care tests. Ask your healthcare provider or your local health department if you need help interpreting your test results. You can visit your state, tribal, local, and territorial health department's website to look for the latest local information on testing sites. Separate yourself from other people As much as possible, stay in a specific room and away from other people and pets in your home. If possible, you should use a separate bathroom. If you need to be around other people or animals in or outside of the home, wear a well-fitting mask. Tell your close contacts that they may have been exposed to COVID-19. An infected person can spread COVID-19 starting 48 hours (or 2 days) before the person has any symptoms or tests  positive. By letting your close contacts know they may have been exposed to COVID-19, you are helping to protect everyone. See COVID-19 and Animals if you have questions about pets. If you are diagnosed with COVID-19, someone from the health department may call you. Answer the call to slow the spread. Monitor your symptoms Symptoms of COVID-19 include fever, cough, or other symptoms. Follow care instructions from your healthcare provider and local health department. Your local health authorities may give instructions on checking your symptoms and reporting information. When to seek emergency medical attention Look for emergency warning signs* for COVID-19. If someone is showing any of these signs, seek emergency medical care immediately: Trouble breathing Persistent pain or pressure in the chest New confusion Inability to wake or stay awake Pale, gray, or blue-colored skin, lips, or nail beds, depending on skin tone *This list is not all possible symptoms. Please call your medical provider for any other symptoms that are severe or concerning to you. Call 911 or call ahead to your local emergency facility: Notify the operator that you are seeking care for someone who has or may have COVID-19. Call ahead before visiting your doctor Call ahead. Many medical visits for routine care are being postponed or done by phone or telemedicine. If you have a medical appointment that cannot be postponed, call your doctor's office, and tell them you have or may have COVID-19. This will help the office protect themselves and other patients. If you are sick, wear a well-fitting mask You should wear a mask if you must be around other people or animals, including pets (even at home). Wear a mask with the best fit, protection, and comfort for you. You don't need to wear the mask if you are alone. If you can't put on a mask (because of trouble breathing, for example), cover your coughs and sneezes in some other way.  Try to stay at least 6 feet away from other people. This will help protect the people around you. Masks should not be placed on young children under age 41 years, anyone who has trouble breathing, or anyone who is not able to remove the mask without help. Cover your coughs and sneezes Cover your mouth and nose with a tissue when you cough or sneeze. Throw away used tissues  in a lined trash can. Immediately wash your hands with soap and water for at least 20 seconds. If soap and water are not available, clean your hands with an alcohol-based hand sanitizer that contains at least 60% alcohol. Clean your hands often Wash your hands often with soap and water for at least 20 seconds. This is especially important after blowing your nose, coughing, or sneezing; going to the bathroom; and before eating or preparing food. Use hand sanitizer if soap and water are not available. Use an alcohol-based hand sanitizer with at least 60% alcohol, covering all surfaces of your hands and rubbing them together until they feel dry. Soap and water are the best option, especially if hands are visibly dirty. Avoid touching your eyes, nose, and mouth with unwashed hands. Handwashing Tips Avoid sharing personal household items Do not share dishes, drinking glasses, cups, eating utensils, towels, or bedding with other people in your home. Wash these items thoroughly after using them with soap and water or put in the dishwasher. Clean surfaces in your home regularly Clean and disinfect high-touch surfaces (for example, doorknobs, tables, handles, light switches, and countertops) in your sick room and bathroom. In shared spaces, you should clean and disinfect surfaces and items after each use by the person who is ill. If you are sick and cannot clean, a caregiver or other person should only clean and disinfect the area around you (such as your bedroom and bathroom) on an as needed basis. Your caregiver/other person should wait  as long as possible (at least several hours) and wear a mask before entering, cleaning, and disinfecting shared spaces that you use. Clean and disinfect areas that may have blood, stool, or body fluids on them. Use household cleaners and disinfectants. Clean visible dirty surfaces with household cleaners containing soap or detergent. Then, use a household disinfectant. Use a product from [employer] List N: Disinfectants for Coronavirus (COVID-19). Be sure to follow the instructions on the label to ensure safe and effective use of the product. Many products recommend keeping the surface wet with a disinfectant for a certain period of time (look at contact time on the product label). You may also need to wear personal protective equipment, such as gloves, depending on the directions on the product label. Immediately after disinfecting, wash your hands with soap and water for 20 seconds. For completed guidance on cleaning and disinfecting your home, visit Complete Disinfection Guidance. Take steps to improve ventilation at home Improve ventilation (air flow) at home to help prevent from spreading COVID-19 to other people in your household. Clear out COVID-19 virus particles in the air by opening windows, using air filters, and turning on fans in your home. Use this interactive tool to learn how to improve air flow in your home. When you can be around others after being sick with COVID-19 Deciding when you can be around others is different for different situations. Find out when you can safely end home isolation. For any additional questions about your care, contact your healthcare provider or state or local health department. 03/24/2020 Content source: [facility] ([facility]), Division of Viral Diseases This information is not intended to replace advice given to you by your health care provider. Make sure you discuss any questions you have with your health care  provider. Document Revised: 05/07/2020 Document Reviewed: 05/07/2020 Elsevier Patient Education  2022 [employer].     If you have been instructed to have an in-person evaluation today at a local  Urgent Care facility, please use the link below. It will take you to a list of all of our available [facility], including address, phone number and hours of operation. Please do not delay care.  [facility]  If you or a family member do not have a primary care provider, use the link below to schedule a visit and establish care. When you choose a [facility] primary care physician or advanced practice provider, you gain a long-term partner in health. Find a Primary Care Provider  Learn more about [facility]'s in-office and virtual care options:  - Get Care Now

## 2023-09-13 NOTE — Progress Notes (Signed)
 Virtual Visit Consent   Philip Stephens, you are scheduled for a virtual visit with a [facility] provider today. Just as with appointments in the office, your consent must be obtained to participate. Your consent will be active for this visit and any virtual visit you may have with one of our providers in the next 365 days. If you have a MyChart account, a copy of this consent can be sent to you electronically.  As this is a virtual visit, video technology does not allow for your provider to perform a traditional examination. This may limit your provider's ability to fully assess your condition. If your provider identifies any concerns that need to be evaluated in person or the need to arrange testing (such as labs, EKG, etc.), we will make arrangements to do so. Although advances in technology are sophisticated, we cannot ensure that it will always work on either your end or our end. If the connection with a video visit is poor, the visit may have to be switched to a telephone visit. With either a video or telephone visit, we are not always able to ensure that we have a secure connection.  By engaging in this virtual visit, you consent to the provision of healthcare and authorize for your insurance to be billed (if applicable) for the services provided during this visit. Depending on your insurance coverage, you may receive a charge related to this service.  I need to obtain your verbal consent now. Are you willing to proceed with your visit today? [employer] has provided verbal consent on 09/13/2023 for a virtual visit (video or telephone). Philip Stephens, [place]  Date: 09/13/2023 4:29 PM   Virtual Visit via Video Note   I, Philip Stephens, connected with  Philip Stephens  ([id number], 1975-12-11) on 09/13/23 at  4:30 PM EDT by a video-enabled telemedicine application and verified that I am speaking with the correct person using two identifiers.  Location: Patient: Virtual Visit  Location Patient: Home Provider: Virtual Visit Location Provider: Home Office   I discussed the limitations of evaluation and management by telemedicine and the availability of in person appointments. The patient expressed understanding and agreed to proceed.    History of Present Illness: Philip Stephens is a 48 y.o. who identifies as a male who was assigned male at birth, and is being seen today for possible COVID-19. Symptom onset Sunday with chills, body aches, headache, restlessness. Denies chest pain or SOB. Notes a persistent cough. Is able to work from home but needing a note work. Wife and Kids are also positive for COVID.    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   Encounter for smoking cessation counseling 01/24/2023   Sexual dysfunction 11/29/2022   Left-sided chest wall pain 10/12/2022   Hoarseness 08/08/2022   Finger laceration 06/03/2022   Skin lesion 05/04/2022   Respiratory illness 04/06/2022   Acute non-recurrent sinusitis 12/30/2021   Bronchitis 10/20/2021   Leukocytosis 09/22/2021   Rash 11/03/2020   Diabetes mellitus without complication (HCC) 09/09/2020   HLD (hyperlipidemia) 09/09/2020   LLQ abdominal pain 09/08/2020   Abdominal bloating 06/17/2020   COVID-19 12/20/2019   Screening for colon cancer 12/20/2019   ANA positive 06/24/2019   Arthralgia 06/24/2019   Neck pain, musculoskeletal 06/24/2019   Localized swelling of right lower leg 05/28/2019   Right knee pain 05/28/2019  Hand swelling 05/17/2019   Chronic neck pain 04/02/2019   Tinnitus 04/02/2019   Coronary artery calcification 11/14/2018   Epigastric pain 08/03/2018   Dysuria 07/27/2018   Right-sided chest pain 07/27/2018   Impotence 05/25/2018   Numbness in both hands 04/18/2018   Fatigue 03/14/2018   Anxiety 01/15/2018   Gastroesophageal reflux disease 01/15/2018   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    Medications:  Current Outpatient Medications:    benzonatate  (TESSALON ) 100 MG capsule, Take 1 capsule (100 mg total) by mouth 3 (three) times daily as needed for cough., Disp: 30 capsule, Rfl: 0   nirmatrelvir /ritonavir  (PAXLOVID ) 20 x 150 MG & 10 x 100MG  TABS, Take 3 tablets by mouth 2 (two) times daily for 5 days. (Take nirmatrelvir  150 mg two tablets twice daily for 5 days and ritonavir  100 mg one tablet twice daily for 5 days) Patient GFR is > 93., Disp: 30 tablet, Rfl: 0   acetaminophen (TYLENOL) 650 MG CR tablet, Take 650 mg by mouth every 8 (eight) hours as needed for pain., Disp: , Rfl:    Aspirin-Salicylamide-Caffeine (ARTHRITIS STRENGTH BC POWDER PO), Take by mouth daily as needed., Disp: , Rfl:    Cholecalciferol 25 MCG (1000 UT) capsule, Take by mouth., Disp: , Rfl:    cyclobenzaprine  (FLEXERIL ) 10 MG tablet, TAKE 1/2 TABLET BY MOUTH AT BEDTIME, Disp: 30 tablet, Rfl: 3   esomeprazole  (NEXIUM ) 20 MG capsule, TAKE 1 CAPSULE BY MOUTH 2 TIMES A DAY BEFORE A MEAL, Disp: 180 capsule, Rfl: 1   ezetimibe  (ZETIA ) 10 MG tablet, TAKE 1 TABLET BY MOUTH DAILY, Disp: 90 tablet, Rfl: 3   metoprolol  succinate (TOPROL -XL) 25 MG 24 hr tablet, TAKE 1 TABLET BY MOUTH DAILY, Disp: 90 tablet, Rfl: 1   Multiple Vitamin (MULTI-VITAMIN) tablet, Take 1 tablet by mouth daily., Disp: , Rfl:    pregabalin  (LYRICA ) 150 MG capsule, TAKE 1 CAPSULE BY MOUTH 3 TIMES A DAY, MAY TAKE 1 EXTRA CAPSULE A DAY AS NEEDED FOR PAIN, Disp: 120 capsule, Rfl: 2   rosuvastatin  (CRESTOR ) 5 MG tablet, TAKE 1 TABLET BY MOUTH DAILY, Disp: 90 tablet, Rfl: 3   sucralfate  (CARAFATE ) 1 g tablet, TAKE 1 TABLET BY MOUTH 3 TIMES A DAY, Disp: 90 tablet, Rfl: 0   traMADol  (ULTRAM ) 50 MG tablet, TAKE 2 TABLETS BY MOUTH EVERY 12 HOURS AS NEEDED , MAY TAKE AN ADDITIONAL TABLET FOR BREAKTHROUGH PAIN, Disp: 150  tablet, Rfl: 2  Observations/Objective: Patient is well-developed, well-nourished in no acute distress.  Resting comfortably  at home.  Head is normocephalic, atraumatic.  No labored breathing.  Speech is clear and coherent with logical content.  Patient is alert and oriented at baseline.   Assessment and Plan: 1. COVID-19 (Primary) - nirmatrelvir /ritonavir  (PAXLOVID ) 20 x 150 MG & 10 x 100MG  TABS; Take 3 tablets by mouth 2 (two) times daily for 5 days. (Take nirmatrelvir  150 mg two tablets twice daily for 5 days and ritonavir  100 mg one tablet twice daily for 5 days) Patient GFR is > 93.  Dispense: 30 tablet; Refill: 0 - benzonatate  (TESSALON ) 100 MG capsule; Take 1 capsule (100 mg total) by mouth 3 (three) times daily as needed for cough.  Dispense: 30 capsule; Refill: 0  Patient with multiple risk factors for complicated course of illness. Discussed risks/benefits of antiviral medications including most common potential ADRs.  Patient voiced understanding and would like to proceed with antiviral medication on a delayed start basis if symptoms are not continuing to improve over next 24-48 hours. Rx Paxlovid  sent to pharmacy. He is aware if started to hold off on Crestor  until antiviral is completed. Supportive measures, OTC medications and vitamin regimen reviewed. Tessalon  per orders.SABRA [place] reviewed in detail. Strict ER precautions discussed with patient.    Follow Up Instructions: I discussed the assessment and treatment plan with the patient. The patient was provided an opportunity to ask questions and all were answered. The patient agreed with the plan and demonstrated an understanding of the instructions.  A copy of instructions were sent to the patient via MyChart unless otherwise noted below.   The patient was advised to call back or seek an in-person evaluation if the symptoms worsen or if the condition fails to improve as anticipated.    Philip Velma Lunger, PA-C

## 2023-09-14 NOTE — Telephone Encounter (Signed)
 Pt wants to know if can provide a note or does he have to wait til Monday ?

## 2023-09-14 NOTE — Telephone Encounter (Signed)
 fyi

## 2023-09-18 ENCOUNTER — Telehealth (INDEPENDENT_AMBULATORY_CARE_PROVIDER_SITE_OTHER): Admitting: Family

## 2023-09-18 ENCOUNTER — Encounter: Payer: Self-pay | Admitting: Family

## 2023-09-18 DIAGNOSIS — U071 COVID-19: Secondary | ICD-10-CM | POA: Diagnosis not present

## 2023-09-18 NOTE — Progress Notes (Signed)
 Virtual Visit via Video Note  I connected with Philip Stephens on 09/18/23 at 12:30 PM EDT by a video enabled telemedicine application and verified that I am speaking with the correct person using two identifiers. Location patient: home Location provider: work  Persons participating in the virtual visit: patient, provider  I discussed the limitations of evaluation and management by telemedicine and the availability of in person appointments. The patient expressed understanding and agreed to proceed.  HPI: He is feeling better, 'definitely better'.  No new complaints.  He is at work today. Occasional cough. Myaglia and chills have resolved . Denies fever  He was treated with doxycycline  via teledoc through work ( notes are not in chart) 08/25/23.   Dx with suspected covid 5 days ago; wife and child were positive. Started on paxlovid , tessalon  perles over VV 09/12/13   ROS: See pertinent positives and negatives per HPI.  EXAM:  VITALS per patient if applicable: There were no vitals taken for this visit. BP Readings from Last 3 Encounters:  04/25/23 118/78  04/24/23 120/70  01/24/23 118/66   Wt Readings from Last 3 Encounters:  08/17/23 162 lb 1.6 oz (73.5 kg)  06/29/23 162 lb (73.5 kg)  04/25/23 162 lb (73.5 kg)    GENERAL: alert, oriented, appears well and in no acute distress  HEENT: atraumatic, conjunttiva clear, no obvious abnormalities on inspection of external nose and ears  NECK: normal movements of the head and neck  LUNGS: on inspection no signs of respiratory distress, breathing rate appears normal, no obvious gross SOB, gasping or wheezing  CV: no obvious cyanosis  MS: moves all visible extremities without noticeable abnormality  PSYCH/NEURO: pleasant and cooperative, no obvious depression or anxiety, speech and thought processing grossly intact  ASSESSMENT AND PLAN: COVID-19 Assessment & Plan: Symptoms resolved at this time.  Patient has resumed work.   Will follow.      -we discussed possible serious and likely etiologies, options for evaluation and workup, limitations of telemedicine visit vs in person visit, treatment, treatment risks and precautions. Pt prefers to treat via telemedicine empirically rather then risking or undertaking an in person visit at this moment.    I discussed the assessment and treatment plan with the patient. The patient was provided an opportunity to ask questions and all were answered. The patient agreed with the plan and demonstrated an understanding of the instructions.   The patient was advised to call back or seek an in-person evaluation if the symptoms worsen or if the condition fails to improve as anticipated.  Advised if desired AVS can be mailed or viewed via MyChart if Mychart user.   Rollene Northern, FNP

## 2023-09-18 NOTE — Patient Instructions (Signed)
 Please ensure you call to schedule urine and lab studies.  These are fasting.  Please ensure follow-up with me in 3 months time.

## 2023-09-18 NOTE — Assessment & Plan Note (Signed)
 Symptoms resolved at this time.  Patient has resumed work.  Will follow.

## 2023-09-26 ENCOUNTER — Ambulatory Visit: Admitting: Family

## 2023-09-28 ENCOUNTER — Ambulatory Visit: Admitting: Family

## 2023-10-04 ENCOUNTER — Ambulatory Visit: Admitting: Family

## 2023-10-17 NOTE — Telephone Encounter (Signed)
See telephone encounter in mothers chart

## 2023-10-19 ENCOUNTER — Ambulatory Visit: Admitting: Family

## 2023-11-06 ENCOUNTER — Other Ambulatory Visit: Payer: Self-pay

## 2023-11-06 ENCOUNTER — Encounter: Payer: Self-pay | Admitting: Family

## 2023-11-06 DIAGNOSIS — G8929 Other chronic pain: Secondary | ICD-10-CM

## 2023-11-07 ENCOUNTER — Other Ambulatory Visit: Payer: Self-pay | Admitting: Family

## 2023-11-07 DIAGNOSIS — G8929 Other chronic pain: Secondary | ICD-10-CM

## 2023-11-07 NOTE — Telephone Encounter (Signed)
 So he started a new job and his insurance doesn't kick in until December 1st he wants to hold off on labs until then so he's not paying for labs out of pocket. His appt to see you is on 11/17/2023 he doesn't mind paying a copay to see you on that day. He does need refills. So should he keep his appt or cancel and see you on the day he can do lab and se you all in the same day? (After December 1st)

## 2023-11-08 NOTE — Telephone Encounter (Signed)
 Noted

## 2023-11-13 ENCOUNTER — Encounter: Payer: Self-pay | Admitting: Pharmacist

## 2023-11-13 NOTE — Progress Notes (Signed)
 Pharmacy Quality Measure Review  This patient is appearing on a report for being at risk of failing the Kidney Health Evaluation for Patients with Diabetes measure this calendar year.   Last documented UACR: None  Future UACR order active in labs.  Upcoming PCP visit scheduled.  Encounter note placed as reminder to collect UACR at Nov visit.   Future Appointments  Date Time Provider Department Center  11/17/2023 11:00 AM Dineen Rollene MATSU, FNP [facility] [id number]  12/08/2023  9:30 AM Dineen Rollene MATSU, FNP [facility] [id number]  06/13/2024 10:00 AM Claudene Lehmann, MD [facility] None   __________________________ SUPD: Pass Pt prescribed rosuvastatin .  Med cation has been filled this measure year, 2025. No action needed.

## 2023-11-17 ENCOUNTER — Ambulatory Visit: Admitting: Family

## 2023-12-08 ENCOUNTER — Ambulatory Visit: Admitting: Family

## 2023-12-08 ENCOUNTER — Encounter: Payer: Self-pay | Admitting: Family

## 2023-12-08 VITALS — BP 118/78 | HR 83 | Temp 97.6°F | Ht 73.0 in | Wt 161.0 lb

## 2023-12-08 DIAGNOSIS — K5903 Drug induced constipation: Secondary | ICD-10-CM

## 2023-12-08 DIAGNOSIS — I2510 Atherosclerotic heart disease of native coronary artery without angina pectoris: Secondary | ICD-10-CM

## 2023-12-08 DIAGNOSIS — G8929 Other chronic pain: Secondary | ICD-10-CM

## 2023-12-08 DIAGNOSIS — Z13220 Encounter for screening for lipoid disorders: Secondary | ICD-10-CM

## 2023-12-08 DIAGNOSIS — E119 Type 2 diabetes mellitus without complications: Secondary | ICD-10-CM

## 2023-12-08 NOTE — Progress Notes (Unsigned)
 Assessment & Plan:  Chronic low back pain with sciatica, sciatica laterality unspecified, unspecified back pain laterality Assessment & Plan: Chronic, overall stable. Waxes and wanes. No cva tenderness on exam.  Discussed at length standing on concrete floors, commuting likely aggravated low back pain.  No medication changes made.  Continue Lyrica  150 mg 3 times daily, with additional 150mg  tablet prn. Max daily dose 600mg  total. Continue tramadol  50mg  qam, one tablet at lunch time, one  tablet at bedtime.  He will take additional tramadol   50mg  prn if needed once throughout the day. Maximum daily dose of tramadol  is 200mg /day.  He is taking Flexeril  5 mg nightly.  Follow-up in 3 months time. Ordered labs, urine drug screen.   Orders: -     ToxASSURE Select 13 (MW), Urine; Future  Encounter for lipid screening for cardiovascular disease -     TSH -     Lipid panel  Coronary artery calcification Assessment & Plan: Chronic , stable. Continue crestor  5mg  , zetia  10mg  qd  Orders: -     TSH  Diabetes mellitus without complication (HCC) -     Comprehensive metabolic panel with GFR -     CBC with Differential/Platelet -     Hemoglobin A1c  Drug-induced constipation Assessment & Plan: Drug-induced constipation.  Discussed scheduling MiraLAX ongoing.  Reiterated the importance of endoscopy, colonoscopy.  He is overdue for screening.  He politely declines at this time.      Return precautions given.   Risks, benefits, and alternatives of the medications and treatment plan prescribed today were discussed, and patient expressed understanding.   Education regarding symptom management and diagnosis given to patient on AVS either electronically or printed.  No follow-ups on file.  Rollene Northern, FNP  Subjective:    Patient ID: Philip Stephens, male    DOB: 03/17/1975, 48 y.o.   MRN: [id number]  CC: Philip Stephens is a 48 y.o. male who presents today for follow up.   HPI:  Complains of right low back pain, improved today.  Pain is not migratory. Pain worse after standing.  No pain usually when he 'gets up in the morning'.  He is standing and walking on concrete with new job as a merchandiser, retail.  He drives 1 hr commuting each way.   Denies fever, chills, nausea, hematuria, saddle anesthesia  No h/o renal stones  Endorses occasional constipation. Last BM 4  days ago.  He is passing gas.  He does not use the bathroom at work and avoids having a BM at work.      He is compliant with Lyrica  150 mg 3 times daily, with additional 150mg  tablet prn. Max daily dose 600mg  total. He is compliant with tramadol  50mg  qam, one tablet at lunch time, one  tablet at bedtime.  He will take additional tramadol   50mg  prn if needed once throughout the day. Maximum daily dose of tramadol  is 200mg /day.   He is taking Flexeril  5 mg nightly.  Regimen does not feel overly sedating for him.  Denies alcohol, illicit drug use  Due EGD, colonoscopy. He is not ready to schedule.   Allergies: Penicillins and Prednisone  Current Outpatient Medications on File Prior to Visit  Medication Sig Dispense Refill   acetaminophen (TYLENOL) 650 MG CR tablet Take 650 mg by mouth every 8 (eight) hours as needed for pain.     Aspirin-Salicylamide-Caffeine (ARTHRITIS STRENGTH BC POWDER PO) Take by mouth daily as needed.     Cholecalciferol 25 MCG (  1000 UT) capsule Take by mouth.     cyclobenzaprine  (FLEXERIL ) 10 MG tablet TAKE 1/2 TABLET BY MOUTH AT BEDTIME 30 tablet 3   esomeprazole  (NEXIUM ) 20 MG capsule TAKE 1 CAPSULE BY MOUTH 2 TIMES A DAY BEFORE A MEAL 180 capsule 1   ezetimibe  (ZETIA ) 10 MG tablet TAKE 1 TABLET BY MOUTH DAILY 90 tablet 3   metoprolol  succinate (TOPROL -XL) 25 MG 24 hr tablet TAKE 1 TABLET BY MOUTH DAILY 90 tablet 1   Multiple Vitamin (MULTI-VITAMIN) tablet Take 1 tablet by mouth daily.     pregabalin  (LYRICA ) 150 MG capsule TAKE 1 CAPSULE BY MOUTH 3 TIMES A DAY .MAY TAKE EXTRA  CAPSULE PER DAY AS NEEDED FOR PAIN 120 capsule 2   rosuvastatin  (CRESTOR ) 5 MG tablet TAKE 1 TABLET BY MOUTH DAILY 90 tablet 3   sucralfate  (CARAFATE ) 1 g tablet TAKE 1 TABLET BY MOUTH 3 TIMES A DAY 90 tablet 0   traMADol  (ULTRAM ) 50 MG tablet TAKE 2 TABLETS BY MOUTH EVERY 12 HOURS AS NEEDED .MAY TAKE ADDITIONAL TABLET FOR BREAKTHROUGH PAIN 150 tablet 2   No current facility-administered medications on file prior to visit.    Review of Systems  Constitutional:  Negative for chills and fever.  Respiratory:  Negative for cough.   Cardiovascular:  Negative for chest pain and palpitations.  Gastrointestinal:  Positive for constipation. Negative for abdominal pain, nausea and vomiting.  Genitourinary:  Negative for difficulty urinating and hematuria.      Objective:    BP 118/78   Pulse 83   Temp 97.6 F (36.4 C) (Oral)   Ht 6' 1 (1.854 m)   Wt 161 lb (73 kg)   SpO2 98%   BMI 21.24 kg/m  BP Readings from Last 3 Encounters:  12/08/23 118/78  04/25/23 118/78  04/24/23 120/70   Wt Readings from Last 3 Encounters:  12/08/23 161 lb (73 kg)  08/17/23 162 lb 1.6 oz (73.5 kg)  06/29/23 162 lb (73.5 kg)    Physical Exam Vitals reviewed.  Constitutional:      Appearance: Normal appearance. He is well-developed.  Cardiovascular:     Rate and Rhythm: Regular rhythm.     Heart sounds: Normal heart sounds.  Pulmonary:     Effort: Pulmonary effort is normal. No respiratory distress.     Breath sounds: Normal breath sounds. No wheezing or rales.  Abdominal:     General: Bowel sounds are normal. There is no distension.     Palpations: Abdomen is soft. Abdomen is not rigid. There is no fluid wave or mass.     Tenderness: There is no abdominal tenderness. There is no right CVA tenderness, left CVA tenderness, guarding or rebound. Negative signs include Murphy's sign and McBurney's sign.     Comments: Bowel sounds present  Musculoskeletal:       Arms:     Lumbar back: No swelling,  spasms or tenderness. Normal range of motion.     Comments: Pain of the right lower back as marked on diagram .  full range of motion with flexion, extension, lateral side bends. No rash.  Skin:    General: Skin is warm and dry.  Neurological:     Mental Status: He is alert.  Psychiatric:        Speech: Speech normal.        Behavior: Behavior normal.

## 2023-12-08 NOTE — Patient Instructions (Signed)
 Start MiraLAX 1 scoop daily on day 6.  Recommend she continue MiraLAX given history of chronic constipation. Start colace, stool softern  Recommend increasing water intake to 64 ounces daily    Constipation plan  1) start taking MiraLAX, half a dose every day or every other day and change the dose as needed after 3 to 5 days with goal of 1-2 soft bowel movements every day or every other day. MiraLAX is an osmotic laxative. That means it draws water into the colon, which softens the stool and may naturally stimulate the colon to contract. These actions help ease bowel movements. For example, you  may find that using the medication every other day or three times a week is a good bowel regimen for you. Or perhaps, twice weekly.   2)  If you do not get results with Miralax alone, you may then add Bisacodyl (dulocolax) suppository daily to regimen until you get desired bowel results. Please remember that stimulant laxatives such as senna and bisacodyl (above) trigger contractions in the bowels that push the stool along. But if you take stimulant laxatives too often, you could become dependent on them to have a bowel movement at all--possibly because the bowel has stopped functioning normally. Please let me know if you are having to use bisacodyl on a regular basis.   3) Take Colace ( stool softener) twice daily every day.   It is MOST important to drink LOTS of water and follow a HIGH fiber diet to keep foods moving through the gut. You may add Metamucil to a beverage that you drink.  Information on prevention of constipation as well as acute treatment for constipation as included below.  If there is no improvement in your symptoms, or if there is any worsening of symptoms, or if you have any additional concerns, please return to this clinic for re-evaluation; or, if we are closed, consider going to the Emergency Room for evaluation.    Constipation Prevention What is Constipation? Constipation  is hard, dry bowel movements or the inability to have a bowel movement.  You can also feel like you need to have a bowel movement but not be able to.  It can also be painful when you strain to have a bowel movement.  Taking narcotic pain medicine after surgery can make you constipated, even if you have never had a problem with constipation. What Do I Need To Do? The best thing to do for constipation is to keep it from happening.  This can be done by: Adding laxatives to your daily routine, when taking prescription pain medicines after surgery. Add 17 gm Miralax daily or 100 mg Colace once or twice daily. (Miralax is mixed in water. Colace is a pill). They soften your bowel movements to make them easier to pass and hurt less. Drink plenty of water to help flush your bowels.  (Eight, 8 ounce glasses daily) Eat foods high in fiber such as whole grains, vegetables, cereals, fruits, and prune juice (5-7 servings a day or 25 grams).  If you do not know how much fiber a food has in it you can look on the label under "dietary fiber."  If you have trouble getting enough fiber in your diet you may want to consider a fiber supplement such as Metamucil or Citrucel.  Also, be aware that eating fiber without drinking enough water can make constipation worse. If you do become constipated some medications that may help are: Bisacodyl (Dulcolax) is available in tablet form  or a suppository. Glycerin suppositories are also a good choice if you need a fast acting medication. Everybody is different and may have different results.  Talk to your pharmacist or health care provider about your specific problems. They can help you choose the best product for you.  Why Is It Important for Me To Do This? Being constipated is not something you have to live with.  There are many things you can do to help.   Feeling bad can interfere with your recovery after surgery.  If constipation goes on for too long it can become a very serious  medical problem. You may need to visit your doctor or go to the hospital.  That is why it is very important to drink lots of water, eat enough fiber, and keep it from happening.  Ask Questions We want to answer all of your questions and concerns.  That's why we encourage you to use a program called Ask Me 3T, created by the [employer].  By using Ask Me 3T you are encouraged to ask 3 simple (yet, potentially life saving questions) whenever you are talking with your physician, nurse or pharmacist: What is my main problem? What do I need to do? Why is it important for me to do this? By understanding the answer to these three questions and any other questions you may have, you have the knowledge necessary to manage your health. Please feel very comfortable asking any questions. Healthcare is complicated, so if you hear an answer you do not understand, please ask your health care team to explain again.   Sources: [employer] 10-22-09 N

## 2023-12-12 DIAGNOSIS — K5900 Constipation, unspecified: Secondary | ICD-10-CM | POA: Insufficient documentation

## 2023-12-12 NOTE — Assessment & Plan Note (Signed)
 Drug-induced constipation.  Discussed scheduling MiraLAX ongoing.  Reiterated the importance of endoscopy, colonoscopy.  He is overdue for screening.  He politely declines at this time.

## 2023-12-12 NOTE — Assessment & Plan Note (Addendum)
 Chronic, overall stable. Waxes and wanes. No cva tenderness on exam.  Discussed at length standing on concrete floors, commuting likely aggravated low back pain.  No medication changes made.  Continue Lyrica  150 mg 3 times daily, with additional 150mg  tablet prn. Max daily dose 600mg  total. Continue tramadol  50mg  qam, one tablet at lunch time, one  tablet at bedtime.  He will take additional tramadol   50mg  prn if needed once throughout the day. Maximum daily dose of tramadol  is 200mg /day.  He is taking Flexeril  5 mg nightly.  Follow-up in 3 months time. Ordered labs, urine drug screen.

## 2023-12-12 NOTE — Assessment & Plan Note (Signed)
 Chronic , stable. Continue crestor  5mg  , zetia  10mg  qd

## 2023-12-19 ENCOUNTER — Telehealth: Payer: Self-pay

## 2023-12-19 NOTE — Telephone Encounter (Signed)
 Spoke to pt and scheduled him for 01/02/24 for fasting labs

## 2023-12-26 ENCOUNTER — Ambulatory Visit: Admitting: Family

## 2024-01-01 ENCOUNTER — Other Ambulatory Visit (HOSPITAL_COMMUNITY): Payer: Self-pay

## 2024-01-01 ENCOUNTER — Telehealth: Payer: Self-pay

## 2024-01-01 NOTE — Telephone Encounter (Signed)
 Pharmacy Patient Advocate Encounter   Received notification from Onbase that prior authorization for Pregabalin  150MG  capsules  is required/requested.   Insurance verification completed.   The patient is insured through [employer] [facility] [employer].   Per test claim: PA required; PA submitted to above mentioned insurance via Latent Key/confirmation #/EOC [facility] Status is pending

## 2024-01-02 ENCOUNTER — Encounter: Payer: Self-pay | Admitting: Family

## 2024-01-02 ENCOUNTER — Other Ambulatory Visit

## 2024-01-02 ENCOUNTER — Other Ambulatory Visit: Payer: Self-pay | Admitting: Family

## 2024-01-02 DIAGNOSIS — M542 Cervicalgia: Secondary | ICD-10-CM

## 2024-01-02 DIAGNOSIS — M545 Low back pain, unspecified: Secondary | ICD-10-CM

## 2024-01-02 NOTE — Addendum Note (Signed)
 Addended by: BRIEN SHARENE RAMAN on: 01/02/2024 01:21 PM   Modules accepted: Orders

## 2024-01-03 ENCOUNTER — Other Ambulatory Visit (INDEPENDENT_AMBULATORY_CARE_PROVIDER_SITE_OTHER)

## 2024-01-03 DIAGNOSIS — Z13220 Encounter for screening for lipoid disorders: Secondary | ICD-10-CM

## 2024-01-03 DIAGNOSIS — E119 Type 2 diabetes mellitus without complications: Secondary | ICD-10-CM | POA: Diagnosis not present

## 2024-01-03 DIAGNOSIS — Z136 Encounter for screening for cardiovascular disorders: Secondary | ICD-10-CM

## 2024-01-03 DIAGNOSIS — G8929 Other chronic pain: Secondary | ICD-10-CM

## 2024-01-03 DIAGNOSIS — I2510 Atherosclerotic heart disease of native coronary artery without angina pectoris: Secondary | ICD-10-CM | POA: Diagnosis not present

## 2024-01-03 LAB — CBC WITH DIFFERENTIAL/PLATELET
Basophils Absolute: 0 K/uL (ref 0.0–0.1)
Basophils Relative: 0.3 % (ref 0.0–3.0)
Eosinophils Absolute: 0.4 K/uL (ref 0.0–0.7)
Eosinophils Relative: 3.6 % (ref 0.0–5.0)
HCT: 42.2 % (ref 39.0–52.0)
Hemoglobin: 14.1 g/dL (ref 13.0–17.0)
Lymphocytes Relative: 23 % (ref 12.0–46.0)
Lymphs Abs: 2.6 K/uL (ref 0.7–4.0)
MCHC: 33.4 g/dL (ref 30.0–36.0)
MCV: 89.7 fl (ref 78.0–100.0)
Monocytes Absolute: 0.8 K/uL (ref 0.1–1.0)
Monocytes Relative: 6.7 % (ref 3.0–12.0)
Neutro Abs: 7.5 K/uL (ref 1.4–7.7)
Neutrophils Relative %: 66.4 % (ref 43.0–77.0)
Platelets: 255 K/uL (ref 150.0–400.0)
RBC: 4.71 Mil/uL (ref 4.22–5.81)
RDW: 14.4 % (ref 11.5–15.5)
WBC: 11.3 K/uL — ABNORMAL HIGH (ref 4.0–10.5)

## 2024-01-03 LAB — COMPREHENSIVE METABOLIC PANEL WITH GFR
ALT: 11 U/L (ref 3–53)
AST: 15 U/L (ref 5–37)
Albumin: 4.3 g/dL (ref 3.5–5.2)
Alkaline Phosphatase: 68 U/L (ref 39–117)
BUN: 16 mg/dL (ref 6–23)
CO2: 30 meq/L (ref 19–32)
Calcium: 8.7 mg/dL (ref 8.4–10.5)
Chloride: 104 meq/L (ref 96–112)
Creatinine, Ser: 0.88 mg/dL (ref 0.40–1.50)
GFR: 101.09 mL/min
Glucose, Bld: 78 mg/dL (ref 70–99)
Potassium: 4 meq/L (ref 3.5–5.1)
Sodium: 140 meq/L (ref 135–145)
Total Bilirubin: 0.4 mg/dL (ref 0.2–1.2)
Total Protein: 6.6 g/dL (ref 6.0–8.3)

## 2024-01-03 LAB — LIPID PANEL
Cholesterol: 132 mg/dL (ref 28–200)
HDL: 34.6 mg/dL — ABNORMAL LOW
LDL Cholesterol: 74 mg/dL (ref 10–99)
NonHDL: 97.51
Total CHOL/HDL Ratio: 4
Triglycerides: 117 mg/dL (ref 10.0–149.0)
VLDL: 23.4 mg/dL (ref 0.0–40.0)

## 2024-01-03 LAB — TSH: TSH: 3.04 u[IU]/mL (ref 0.35–5.50)

## 2024-01-03 LAB — HEMOGLOBIN A1C: Hgb A1c MFr Bld: 6.5 % (ref 4.6–6.5)

## 2024-01-07 LAB — TOXASSURE SELECT 13 (MW), URINE

## 2024-01-08 NOTE — Telephone Encounter (Deleted)
Pharmacy Patient Advocate Encounter   Received notification from {Referral Source:29445} that prior authorization for {Medication:29728} is required/requested.   Insurance verification completed.   The patient is insured through {INSRESPONSE:22218} .   Per test claim: {copay/ins/PA:29915}

## 2024-01-08 NOTE — Telephone Encounter (Signed)
 Pt is aware that medication has been approve through his insurance.

## 2024-01-08 NOTE — Telephone Encounter (Signed)
 Pharmacy Patient Advocate Encounter  Received notification from [facility] that Prior Authorization for Pregabalin  150MG  capsules  has been APPROVED from 01/07/2023 to 01/06/2025   PA #/Case ID/Reference #: [id number] Your PA request has been approved. Additional information will be provided in the approval communication. <no modification to standard approval letter template-internal notes to populate internally only> Authorization Expiration01/04/2025

## 2024-01-19 ENCOUNTER — Ambulatory Visit: Payer: Self-pay | Admitting: Family

## 2024-01-19 DIAGNOSIS — E119 Type 2 diabetes mellitus without complications: Secondary | ICD-10-CM

## 2024-02-26 ENCOUNTER — Other Ambulatory Visit

## 2024-06-13 ENCOUNTER — Ambulatory Visit: Admitting: Dermatology
